# Patient Record
Sex: Male | Born: 1950 | Race: White | Hispanic: No | Marital: Married | Smoking: Never smoker
Health system: Southern US, Community
[De-identification: ages and names within clinical notes are randomized; demographics above are authoritative.]

## PROBLEM LIST (undated history)

## (undated) DIAGNOSIS — I2510 Atherosclerotic heart disease of native coronary artery without angina pectoris: Secondary | ICD-10-CM

## (undated) DIAGNOSIS — E119 Type 2 diabetes mellitus without complications: Secondary | ICD-10-CM

## (undated) DIAGNOSIS — I428 Other cardiomyopathies: Secondary | ICD-10-CM

## (undated) DIAGNOSIS — M75101 Unspecified rotator cuff tear or rupture of right shoulder, not specified as traumatic: Secondary | ICD-10-CM

## (undated) DIAGNOSIS — M1990 Unspecified osteoarthritis, unspecified site: Secondary | ICD-10-CM

## (undated) DIAGNOSIS — I509 Heart failure, unspecified: Secondary | ICD-10-CM

## (undated) DIAGNOSIS — K219 Gastro-esophageal reflux disease without esophagitis: Secondary | ICD-10-CM

## (undated) DIAGNOSIS — J45909 Unspecified asthma, uncomplicated: Secondary | ICD-10-CM

## (undated) DIAGNOSIS — I10 Essential (primary) hypertension: Secondary | ICD-10-CM

## (undated) DIAGNOSIS — E669 Obesity, unspecified: Secondary | ICD-10-CM

## (undated) DIAGNOSIS — E785 Hyperlipidemia, unspecified: Secondary | ICD-10-CM

## (undated) HISTORY — DX: Other cardiomyopathies: I42.8

## (undated) HISTORY — DX: Unspecified asthma, uncomplicated: J45.909

## (undated) HISTORY — DX: Atherosclerotic heart disease of native coronary artery without angina pectoris: I25.10

## (undated) HISTORY — PX: CHOLECYSTECTOMY: SHX55

---

## 2012-11-03 DIAGNOSIS — I428 Other cardiomyopathies: Secondary | ICD-10-CM

## 2012-11-03 HISTORY — DX: Other cardiomyopathies: I42.8

## 2012-11-26 ENCOUNTER — Inpatient Hospital Stay
Admission: RE | Admit: 2012-11-26 | Discharge: 2012-11-26 | Disposition: A | Discharge status: Home / Self Care | Payer: Self-pay | Source: Ambulatory Visit | Attending: Family Medicine | Admitting: Family Medicine

## 2012-11-26 ENCOUNTER — Ambulatory Visit
Admission: RE | Admit: 2012-11-26 | Discharge: 2012-11-26 | Disposition: A | Discharge status: Home / Self Care | Payer: BC Managed Care – PPO | Source: Ambulatory Visit | Attending: Family Medicine | Admitting: Family Medicine

## 2012-11-26 ENCOUNTER — Other Ambulatory Visit: Payer: Self-pay | Admitting: Family Medicine

## 2012-11-26 DIAGNOSIS — R0602 Shortness of breath: Secondary | ICD-10-CM

## 2012-11-26 DIAGNOSIS — R61 Generalized hyperhidrosis: Secondary | ICD-10-CM

## 2012-11-26 MED ORDER — IOHEXOL 350 MG/ML SOLN
100.0000 mL | Freq: Once | INTRAVENOUS | Status: AC | PRN
  Administered 2012-11-26: 100 mL via INTRAVENOUS

## 2012-11-30 ENCOUNTER — Encounter (HOSPITAL_COMMUNITY): Payer: Self-pay | Admitting: *Deleted

## 2012-11-30 ENCOUNTER — Other Ambulatory Visit (HOSPITAL_COMMUNITY): Payer: Self-pay | Admitting: Family Medicine

## 2012-11-30 ENCOUNTER — Emergency Department (HOSPITAL_COMMUNITY): Payer: BC Managed Care – PPO

## 2012-11-30 ENCOUNTER — Inpatient Hospital Stay (HOSPITAL_COMMUNITY)
Admission: EM | Admit: 2012-11-30 | Discharge: 2012-12-03 | DRG: 124 | Disposition: A | Discharge status: Home / Self Care | Payer: BC Managed Care – PPO | Attending: Cardiology | Admitting: Cardiology

## 2012-11-30 DIAGNOSIS — E119 Type 2 diabetes mellitus without complications: Secondary | ICD-10-CM | POA: Diagnosis present

## 2012-11-30 DIAGNOSIS — E785 Hyperlipidemia, unspecified: Secondary | ICD-10-CM | POA: Diagnosis present

## 2012-11-30 DIAGNOSIS — I059 Rheumatic mitral valve disease, unspecified: Secondary | ICD-10-CM | POA: Diagnosis present

## 2012-11-30 DIAGNOSIS — Z9089 Acquired absence of other organs: Secondary | ICD-10-CM

## 2012-11-30 DIAGNOSIS — Z6841 Body Mass Index (BMI) 40.0 and over, adult: Secondary | ICD-10-CM

## 2012-11-30 DIAGNOSIS — Z8249 Family history of ischemic heart disease and other diseases of the circulatory system: Secondary | ICD-10-CM

## 2012-11-30 DIAGNOSIS — Z7982 Long term (current) use of aspirin: Secondary | ICD-10-CM

## 2012-11-30 DIAGNOSIS — Z833 Family history of diabetes mellitus: Secondary | ICD-10-CM

## 2012-11-30 DIAGNOSIS — Y929 Unspecified place or not applicable: Secondary | ICD-10-CM

## 2012-11-30 DIAGNOSIS — R0609 Other forms of dyspnea: Secondary | ICD-10-CM | POA: Diagnosis present

## 2012-11-30 DIAGNOSIS — R9389 Abnormal findings on diagnostic imaging of other specified body structures: Secondary | ICD-10-CM | POA: Diagnosis present

## 2012-11-30 DIAGNOSIS — IMO0002 Reserved for concepts with insufficient information to code with codable children: Secondary | ICD-10-CM

## 2012-11-30 DIAGNOSIS — R0989 Other specified symptoms and signs involving the circulatory and respiratory systems: Secondary | ICD-10-CM | POA: Diagnosis present

## 2012-11-30 DIAGNOSIS — R0601 Orthopnea: Secondary | ICD-10-CM | POA: Diagnosis present

## 2012-11-30 DIAGNOSIS — R0602 Shortness of breath: Secondary | ICD-10-CM

## 2012-11-30 DIAGNOSIS — K219 Gastro-esophageal reflux disease without esophagitis: Secondary | ICD-10-CM | POA: Diagnosis present

## 2012-11-30 DIAGNOSIS — I498 Other specified cardiac arrhythmias: Secondary | ICD-10-CM | POA: Diagnosis present

## 2012-11-30 DIAGNOSIS — I420 Dilated cardiomyopathy: Secondary | ICD-10-CM

## 2012-11-30 DIAGNOSIS — I428 Other cardiomyopathies: Principal | ICD-10-CM | POA: Diagnosis present

## 2012-11-30 DIAGNOSIS — I10 Essential (primary) hypertension: Secondary | ICD-10-CM | POA: Diagnosis present

## 2012-11-30 DIAGNOSIS — E669 Obesity, unspecified: Secondary | ICD-10-CM | POA: Diagnosis present

## 2012-11-30 DIAGNOSIS — T383X5A Adverse effect of insulin and oral hypoglycemic [antidiabetic] drugs, initial encounter: Secondary | ICD-10-CM | POA: Diagnosis present

## 2012-11-30 DIAGNOSIS — I447 Left bundle-branch block, unspecified: Secondary | ICD-10-CM | POA: Diagnosis present

## 2012-11-30 HISTORY — DX: Obesity, unspecified: E66.9

## 2012-11-30 HISTORY — DX: Unspecified rotator cuff tear or rupture of right shoulder, not specified as traumatic: M75.101

## 2012-11-30 HISTORY — DX: Gastro-esophageal reflux disease without esophagitis: K21.9

## 2012-11-30 HISTORY — DX: Hyperlipidemia, unspecified: E78.5

## 2012-11-30 HISTORY — DX: Essential (primary) hypertension: I10

## 2012-11-30 HISTORY — DX: Type 2 diabetes mellitus without complications: E11.9

## 2012-11-30 LAB — COMPREHENSIVE METABOLIC PANEL
AST: 17 U/L (ref 0–37)
BUN: 19 mg/dL (ref 6–23)
CO2: 25 mEq/L (ref 19–32)
Calcium: 9.4 mg/dL (ref 8.4–10.5)
Chloride: 99 mEq/L (ref 96–112)
Creatinine, Ser: 0.88 mg/dL (ref 0.50–1.35)
GFR calc Af Amer: >90 mL/min (ref >90)
GFR calc non Af Amer: >90 mL/min (ref >90)
Glucose, Bld: 179 mg/dL — ABNORMAL HIGH (ref 70–99)
Total Bilirubin: 0.5 mg/dL (ref 0.3–1.2)

## 2012-11-30 LAB — PRO B NATRIURETIC PEPTIDE: Pro B Natriuretic peptide (BNP): 390.2 pg/mL — ABNORMAL HIGH (ref 0–125)

## 2012-11-30 LAB — CBC
HCT: 40.4 % (ref 39.0–52.0)
MCHC: 36.4 g/dL — ABNORMAL HIGH (ref 30.0–36.0)
RBC: 4.76 MIL/uL (ref 4.22–5.81)
WBC: 8.3 10*3/uL (ref 4.0–10.5)

## 2012-11-30 LAB — PROTIME-INR
INR: 0.87 (ref 0.00–1.49)
Prothrombin Time: 11.7 seconds (ref 11.6–15.2)

## 2012-11-30 LAB — POCT I-STAT 3, ART BLOOD GAS (G3+)
Acid-base deficit: 1 mmol/L (ref 0.0–2.0)
Bicarbonate: 22.4 mEq/L (ref 20.0–24.0)
pH, Arterial: 7.446 (ref 7.350–7.450)

## 2012-11-30 LAB — CREATININE, SERUM
GFR calc Af Amer: >90 mL/min (ref >90)
GFR calc non Af Amer: 88 mL/min — ABNORMAL LOW (ref >90)

## 2012-11-30 LAB — CBC WITH DIFFERENTIAL/PLATELET
Basophils Absolute: 0 10*3/uL (ref 0.0–0.1)
Eosinophils Absolute: 0.2 10*3/uL (ref 0.0–0.7)
Eosinophils Relative: 3 % (ref 0–5)
Lymphocytes Relative: 19 % (ref 12–46)
MCV: 84.3 fL (ref 78.0–100.0)
Neutrophils Relative %: 69 % (ref 43–77)
Platelets: 248 10*3/uL (ref 150–400)
RDW: 13.1 % (ref 11.5–15.5)
WBC: 8.8 10*3/uL (ref 4.0–10.5)

## 2012-11-30 LAB — TROPONIN I
Troponin I: <0.3 ng/mL (ref <0.30)
Troponin I: <0.3 ng/mL (ref <0.30)

## 2012-11-30 LAB — GLUCOSE, CAPILLARY: Glucose-Capillary: 133 mg/dL — ABNORMAL HIGH (ref 70–99)

## 2012-11-30 MED ORDER — ALBUTEROL SULFATE HFA 108 (90 BASE) MCG/ACT IN AERS
2.0000 | INHALATION_SPRAY | Freq: Four times a day (QID) | RESPIRATORY_TRACT | Status: DC | PRN
  Filled 2012-11-30: qty 6.7

## 2012-11-30 MED ORDER — INSULIN ASPART 100 UNIT/ML ~~LOC~~ SOLN
0.0000 [IU] | Freq: Three times a day (TID) | SUBCUTANEOUS | Status: DC
  Administered 2012-11-30: 5 [IU] via SUBCUTANEOUS
  Administered 2012-12-01: 3 [IU] via SUBCUTANEOUS
  Administered 2012-12-01 – 2012-12-03 (×4): 2 [IU] via SUBCUTANEOUS

## 2012-11-30 MED ORDER — NITROGLYCERIN 0.4 MG SL SUBL: 0.4000 mg | SUBLINGUAL_TABLET | SUBLINGUAL | Status: DC | PRN

## 2012-11-30 MED ORDER — ONDANSETRON HCL 4 MG/2ML IJ SOLN: 4.0000 mg | Freq: Four times a day (QID) | INTRAMUSCULAR | Status: DC | PRN

## 2012-11-30 MED ORDER — OXYCODONE-ACETAMINOPHEN 5-325 MG PO TABS: 1.0000 | ORAL_TABLET | ORAL | Status: DC | PRN

## 2012-11-30 MED ORDER — FUROSEMIDE 10 MG/ML IJ SOLN
40.0000 mg | Freq: Once | INTRAMUSCULAR | Status: AC
  Administered 2012-11-30: 40 mg via INTRAVENOUS
  Filled 2012-11-30: qty 4

## 2012-11-30 MED ORDER — ACETAMINOPHEN 325 MG PO TABS: 650.0000 mg | ORAL_TABLET | ORAL | Status: DC | PRN

## 2012-11-30 MED ORDER — PANTOPRAZOLE SODIUM 40 MG PO TBEC
40.0000 mg | DELAYED_RELEASE_TABLET | Freq: Every day | ORAL | Status: DC
  Administered 2012-11-30 – 2012-12-03 (×4): 40 mg via ORAL
  Filled 2012-11-30 (×4): qty 1

## 2012-11-30 MED ORDER — FUROSEMIDE 10 MG/ML IJ SOLN
40.0000 mg | Freq: Two times a day (BID) | INTRAMUSCULAR | Status: DC
  Administered 2012-11-30: 40 mg via INTRAVENOUS
  Filled 2012-11-30 (×3): qty 4

## 2012-11-30 MED ORDER — SODIUM CHLORIDE 0.9 % IJ SOLN: 3.0000 mL | INTRAMUSCULAR | Status: DC | PRN

## 2012-11-30 MED ORDER — SIMVASTATIN 20 MG PO TABS
20.0000 mg | ORAL_TABLET | Freq: Every day | ORAL | Status: DC
  Administered 2012-11-30 – 2012-12-01 (×2): 20 mg via ORAL
  Filled 2012-11-30 (×5): qty 1

## 2012-11-30 MED ORDER — LISINOPRIL 10 MG PO TABS
10.0000 mg | ORAL_TABLET | Freq: Every day | ORAL | Status: DC
  Administered 2012-11-30 – 2012-12-03 (×4): 10 mg via ORAL
  Filled 2012-11-30 (×4): qty 1

## 2012-11-30 MED ORDER — SODIUM CHLORIDE 0.9 % IJ SOLN
3.0000 mL | Freq: Two times a day (BID) | INTRAMUSCULAR | Status: DC
  Administered 2012-11-30 – 2012-12-03 (×6): 3 mL via INTRAVENOUS

## 2012-11-30 MED ORDER — GLIMEPIRIDE 2 MG PO TABS
2.0000 mg | ORAL_TABLET | Freq: Every day | ORAL | Status: DC
Start: 2012-12-01 — End: 2012-12-03
  Administered 2012-12-01 – 2012-12-03 (×2): 2 mg via ORAL
  Filled 2012-11-30 (×4): qty 1

## 2012-11-30 MED ORDER — ZOLPIDEM TARTRATE 5 MG PO TABS
5.0000 mg | ORAL_TABLET | Freq: Every evening | ORAL | Status: DC | PRN
  Administered 2012-12-01: 5 mg via ORAL
  Filled 2012-11-30: qty 1

## 2012-11-30 MED ORDER — ASPIRIN EC 81 MG PO TBEC
81.0000 mg | DELAYED_RELEASE_TABLET | Freq: Every day | ORAL | Status: DC
  Administered 2012-11-30 – 2012-12-01 (×2): 81 mg via ORAL
  Filled 2012-11-30 (×2): qty 1

## 2012-11-30 MED ORDER — HEPARIN SODIUM (PORCINE) 5000 UNIT/ML IJ SOLN
5000.0000 [IU] | Freq: Three times a day (TID) | INTRAMUSCULAR | Status: DC
  Administered 2012-11-30 – 2012-12-02 (×5): 5000 [IU] via SUBCUTANEOUS
  Filled 2012-11-30 (×8): qty 1

## 2012-11-30 MED ORDER — TRIAMCINOLONE ACETONIDE 0.1 % EX CREA
1.0000 "application " | TOPICAL_CREAM | Freq: Every day | CUTANEOUS | Status: DC
  Administered 2012-11-30 – 2012-12-02 (×3): 1 via TOPICAL
  Filled 2012-11-30: qty 15

## 2012-11-30 MED ORDER — SODIUM CHLORIDE 0.9 % IV SOLN: 250.0000 mL | INTRAVENOUS | Status: DC | PRN
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
## 2012-11-30 NOTE — ED Notes (Signed)
Patient did well, ambulated around unit at 02 stat 96%
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
## 2012-11-30 NOTE — ED Notes (Signed)
Patient states he has been having sob x 7 weeks, patient states worse at night, patient states he feels he can't catch his breath and cannot lie flat

## 2012-12-01 DIAGNOSIS — R0989 Other specified symptoms and signs involving the circulatory and respiratory systems: Secondary | ICD-10-CM

## 2012-12-01 DIAGNOSIS — I059 Rheumatic mitral valve disease, unspecified: Secondary | ICD-10-CM

## 2012-12-01 LAB — COMPREHENSIVE METABOLIC PANEL
BUN: 20 mg/dL (ref 6–23)
CO2: 26 mEq/L (ref 19–32)
Calcium: 8.8 mg/dL (ref 8.4–10.5)
Creatinine, Ser: 1.05 mg/dL (ref 0.50–1.35)
GFR calc Af Amer: 87 mL/min — ABNORMAL LOW (ref >90)
GFR calc non Af Amer: 75 mL/min — ABNORMAL LOW (ref >90)
Glucose, Bld: 202 mg/dL — ABNORMAL HIGH (ref 70–99)

## 2012-12-01 LAB — GLUCOSE, CAPILLARY: Glucose-Capillary: 135 mg/dL — ABNORMAL HIGH (ref 70–99)

## 2012-12-01 LAB — LIPID PANEL
HDL: 36 mg/dL — ABNORMAL LOW (ref >39)
LDL Cholesterol: 79 mg/dL (ref 0–99)
Triglycerides: 129 mg/dL (ref <150)
VLDL: 26 mg/dL (ref 0–40)

## 2012-12-01 LAB — TROPONIN I: Troponin I: <0.3 ng/mL (ref <0.30)

## 2012-12-01 LAB — PROTIME-INR: Prothrombin Time: 12.5 seconds (ref 11.6–15.2)

## 2012-12-01 MED ORDER — PERFLUTREN LIPID MICROSPHERE
1.0000 mL | INTRAVENOUS | Status: AC | PRN
  Administered 2012-12-01: 1.5 mL via INTRAVENOUS
  Filled 2012-12-01: qty 10

## 2012-12-01 MED ORDER — SODIUM CHLORIDE 0.9 % IV SOLN: 250.0000 mL | INTRAVENOUS | Status: DC | PRN

## 2012-12-01 MED ORDER — ASPIRIN 81 MG PO CHEW
324.0000 mg | CHEWABLE_TABLET | ORAL | Status: AC
  Administered 2012-12-02: 324 mg via ORAL
  Filled 2012-12-01: qty 4

## 2012-12-01 MED ORDER — METOPROLOL SUCCINATE ER 25 MG PO TB24
25.0000 mg | ORAL_TABLET | Freq: Every day | ORAL | Status: DC
  Administered 2012-12-01: 25 mg via ORAL
  Filled 2012-12-01 (×2): qty 1

## 2012-12-01 MED ORDER — ASPIRIN EC 81 MG PO TBEC
81.0000 mg | DELAYED_RELEASE_TABLET | Freq: Every day | ORAL | Status: DC
  Administered 2012-12-03: 81 mg via ORAL
  Filled 2012-12-01: qty 1

## 2012-12-01 MED ORDER — FUROSEMIDE 20 MG PO TABS
20.0000 mg | ORAL_TABLET | Freq: Every day | ORAL | Status: DC
  Administered 2012-12-01: 20 mg via ORAL
  Filled 2012-12-01 (×2): qty 1

## 2012-12-01 MED ORDER — SODIUM CHLORIDE 0.9 % IJ SOLN: 3.0000 mL | INTRAMUSCULAR | Status: DC | PRN

## 2012-12-01 MED ORDER — SODIUM CHLORIDE 0.9 % IJ SOLN
3.0000 mL | Freq: Two times a day (BID) | INTRAMUSCULAR | Status: DC
  Administered 2012-12-01 – 2012-12-02 (×2): 3 mL via INTRAVENOUS

## 2012-12-01 NOTE — Progress Notes (Addendum)
  Echocardiogram 2D Echocardiogram with Definity has been performed.  Cathie Beams 12/01/2012, 12:23 PM

## 2012-12-01 NOTE — Progress Notes (Signed)
   Subjective:  Denies CP; dyspnea improved   Objective:  Filed Vitals:   11/30/12 1100 11/30/12 1351 11/30/12 2201 12/01/12 0557  BP: 131/98 139/96 124/90 125/89  Pulse: 101 104 102 101  Temp:  98.1 F (36.7 C) 97.7 F (36.5 C) 97.5 F (36.4 C)  TempSrc:  Oral Oral Oral  Resp: 18 18 18 18   Height:      Weight:    262 lb 9.1 oz (119.1 kg)  SpO2: 95% 97% 100% 97%    Intake/Output from previous day:  Intake/Output Summary (Last 24 hours) at 12/01/12 0709 Last data filed at 12/01/12 1610  Gross per 24 hour  Intake    480 ml  Output   2100 ml  Net  -1620 ml    Physical Exam: Physical exam: Well-developed well-nourished in no acute distress.  Skin is warm and dry.  HEENT is normal.  Neck is supple.  Chest is clear to auscultation with normal expansion.  Cardiovascular exam is regular rate and rhythm. + gallop Abdominal exam nontender or distended. No masses palpated. Extremities show no edema. neuro grossly intact    Lab Results: Basic Metabolic Panel:  Recent Labs  96[birth year]04[birth year] 0830 11/30/12 1409 12/01/12 0137  NA 134*  --  135  K 3.9  --  3.7  CL 99  --  98  CO2 25  --  26  GLUCOSE 179*  --  202*  BUN 19  --  20  CREATININE 0.88 0.97 1.05  CALCIUM 9.4  --  8.8   CBC:  Recent Labs  11/30/12 1041 11/30/12 1409  WBC 8.8 8.3  NEUTROABS 6.1  --   HGB 14.1 14.7  HCT 38.7* 40.4  MCV 84.3 84.9  PLT 248 249   Cardiac Enzymes:  Recent Labs  11/30/12 1408 11/30/12 1954 12/01/12 0137  TROPONINI <0.30 <0.30 <0.30     Assessment/Plan:  1 dyspnea-predominant symptom is orthopnea. Much improved following diuresis. S4 noted on exam.  Plan echocardiogram today. If LV function is abnormal he will need cardiac catheterization. Otherwise we will plan an outpatient nuclear study for risk stratification. I will add low-dose Lasix 20 mg daily at home. He will need his potassium and renal function checked in one week. 2 abnormal chest CT-he will need followup  noncontrast chest CT in 6 months. 3 coronary calcification noted on CT-plan as outlined in #1. He has not had chest pain. Continue aspirin and statin. 4 hypertension-blood pressure has been mildly elevated. Noted to be mildly tachycardic on telemetry. Add low-dose beta-blockade. 5 diabetes mellitus-management per primary care. 6 hyperlipidemia-continue statin. [name] 12/01/2012, 7:09 AM

## 2012-12-01 NOTE — Care Management Note (Unsigned)
    Page 1 of 1   12/01/2012     3:20:29 PM   CARE MANAGEMENT NOTE 12/01/2012  Patient:  Gary Brady, Gary Brady   Account Number:  [id number]  Date Initiated:  12/01/2012  Documentation initiated by:  Milaina Sher  Subjective/Objective Assessment:   PT ADM ON 11/30/12 WITH DYSPNEA, SINUS TACH.  PTA, PT INDEPENDENT, LIVES WITH SPOUSE.     Action/Plan:   WILL FOLLOW FOR HOME NEEDS AS PT PROGRESSES.   Anticipated DC Date:  12/03/2012   Anticipated DC Plan:  HOME W HOME HEALTH SERVICES      DC Planning Services  CM consult      Choice offered to / List presented to:             Status of service:  In process, will continue to follow Medicare Important Message given?   (If response is "NO", the following Medicare IM given date fields will be blank) Date Medicare IM given:   Date Additional Medicare IM given:    Discharge Disposition:    Per UR Regulation:  Reviewed for med. necessity/level of care/duration of stay  If discussed at Long Length of Stay Meetings, dates discussed:    Comments:

## 2012-12-01 NOTE — Progress Notes (Signed)
Inpatient Diabetes Program Recommendations  AACE/ADA: New Consensus Statement on Inpatient Glycemic Control (2013)  Target Ranges:  Prepandial:   less than 140 mg/dL      Peak postprandial:   less than 180 mg/dL (1-2 hours)      Critically ill patients:  140 - 180 mg/dL   Reason for Visit: Note history of diabetes.  According to history Metformin causes diarrhea and likely will need alternative medication.  Please check A1C to determine pre-hospitalization glycemic control. Will need follow-up with PCP for diabetes after hospitalization.

## 2012-12-01 NOTE — Progress Notes (Signed)
Pt and wife watching video 115 regarding cardiac cath; will cont. To monitor.

## 2012-12-02 ENCOUNTER — Encounter (HOSPITAL_COMMUNITY)
Admission: EM | Disposition: A | Discharge status: Home / Self Care | Payer: Self-pay | Source: Home / Self Care | Attending: Cardiology

## 2012-12-02 DIAGNOSIS — I428 Other cardiomyopathies: Secondary | ICD-10-CM | POA: Diagnosis present

## 2012-12-02 DIAGNOSIS — E785 Hyperlipidemia, unspecified: Secondary | ICD-10-CM

## 2012-12-02 DIAGNOSIS — I2510 Atherosclerotic heart disease of native coronary artery without angina pectoris: Secondary | ICD-10-CM

## 2012-12-02 HISTORY — PX: LEFT AND RIGHT HEART CATHETERIZATION WITH CORONARY ANGIOGRAM: SHX5449

## 2012-12-02 LAB — CBC
HCT: 39.9 % (ref 39.0–52.0)
Hemoglobin: 14.7 g/dL (ref 13.0–17.0)
MCHC: 36.8 g/dL — ABNORMAL HIGH (ref 30.0–36.0)
Platelets: 271 10*3/uL (ref 150–400)
RBC: 4.69 MIL/uL (ref 4.22–5.81)
RBC: 4.78 MIL/uL (ref 4.22–5.81)
RDW: 13.1 % (ref 11.5–15.5)
WBC: 10.4 10*3/uL (ref 4.0–10.5)

## 2012-12-02 LAB — POCT I-STAT 3, ART BLOOD GAS (G3+)
Bicarbonate: 24.9 mEq/L — ABNORMAL HIGH (ref 20.0–24.0)
TCO2: 26 mmol/L (ref 0–100)
pO2, Arterial: 85 mmHg (ref 80.0–100.0)

## 2012-12-02 LAB — POCT I-STAT 3, VENOUS BLOOD GAS (G3P V)
Bicarbonate: 25.9 mEq/L — ABNORMAL HIGH (ref 20.0–24.0)
TCO2: 27 mmol/L (ref 0–100)
pCO2, Ven: 45.6 mmHg (ref 45.0–50.0)
pH, Ven: 7.362 — ABNORMAL HIGH (ref 7.250–7.300)

## 2012-12-02 LAB — CREATININE, SERUM
Creatinine, Ser: 1.1 mg/dL (ref 0.50–1.35)
GFR calc Af Amer: 82 mL/min — ABNORMAL LOW (ref >90)

## 2012-12-02 LAB — GLUCOSE, CAPILLARY
Glucose-Capillary: 160 mg/dL — ABNORMAL HIGH (ref 70–99)
Glucose-Capillary: 140 mg/dL — ABNORMAL HIGH (ref 70–99)

## 2012-12-02 SURGERY — LEFT AND RIGHT HEART CATHETERIZATION WITH CORONARY ANGIOGRAM
Anesthesia: LOCAL

## 2012-12-02 MED ORDER — HEPARIN SODIUM (PORCINE) 5000 UNIT/ML IJ SOLN
5000.0000 [IU] | Freq: Three times a day (TID) | INTRAMUSCULAR | Status: DC
  Administered 2012-12-02 – 2012-12-03 (×2): 5000 [IU] via SUBCUTANEOUS
  Filled 2012-12-02 (×5): qty 1

## 2012-12-02 MED ORDER — MIDAZOLAM HCL 2 MG/2ML IJ SOLN
INTRAMUSCULAR | Status: AC
  Filled 2012-12-02: qty 2

## 2012-12-02 MED ORDER — LIDOCAINE HCL (PF) 1 % IJ SOLN
INTRAMUSCULAR | Status: AC
  Filled 2012-12-02: qty 30

## 2012-12-02 MED ORDER — ONDANSETRON HCL 4 MG/2ML IJ SOLN: 4.0000 mg | Freq: Four times a day (QID) | INTRAMUSCULAR | Status: DC | PRN

## 2012-12-02 MED ORDER — SODIUM CHLORIDE 0.9 % IV SOLN: 250.0000 mL | INTRAVENOUS | Status: DC | PRN

## 2012-12-02 MED ORDER — POTASSIUM CHLORIDE CRYS ER 20 MEQ PO TBCR
40.0000 meq | EXTENDED_RELEASE_TABLET | Freq: Every day | ORAL | Status: DC
  Administered 2012-12-02: 40 meq via ORAL
  Filled 2012-12-02 (×3): qty 2

## 2012-12-02 MED ORDER — CARVEDILOL 6.25 MG PO TABS
6.2500 mg | ORAL_TABLET | Freq: Two times a day (BID) | ORAL | Status: DC
  Administered 2012-12-02 – 2012-12-03 (×3): 6.25 mg via ORAL
  Filled 2012-12-02 (×5): qty 1

## 2012-12-02 MED ORDER — SODIUM CHLORIDE 0.9 % IJ SOLN: 3.0000 mL | INTRAMUSCULAR | Status: DC | PRN

## 2012-12-02 MED ORDER — ATORVASTATIN CALCIUM 80 MG PO TABS
80.0000 mg | ORAL_TABLET | Freq: Every day | ORAL | Status: DC
  Administered 2012-12-02: 80 mg via ORAL
  Filled 2012-12-02 (×3): qty 1

## 2012-12-02 MED ORDER — ACETAMINOPHEN 325 MG PO TABS: 650.0000 mg | ORAL_TABLET | ORAL | Status: DC | PRN

## 2012-12-02 MED ORDER — FENTANYL CITRATE 0.05 MG/ML IJ SOLN
INTRAMUSCULAR | Status: AC
  Filled 2012-12-02: qty 2

## 2012-12-02 MED ORDER — SODIUM CHLORIDE 0.9 % IJ SOLN
3.0000 mL | Freq: Two times a day (BID) | INTRAMUSCULAR | Status: DC
  Administered 2012-12-02 (×2): 3 mL via INTRAVENOUS

## 2012-12-02 MED ORDER — FUROSEMIDE 10 MG/ML IJ SOLN
40.0000 mg | Freq: Two times a day (BID) | INTRAMUSCULAR | Status: DC
  Administered 2012-12-02: 40 mg via INTRAVENOUS
  Filled 2012-12-02 (×3): qty 4

## 2012-12-02 MED ORDER — NITROGLYCERIN 0.2 MG/ML ON CALL CATH LAB
INTRAVENOUS | Status: AC
  Filled 2012-12-02: qty 1

## 2012-12-02 MED ORDER — HEPARIN (PORCINE) IN NACL 2-0.9 UNIT/ML-% IJ SOLN
INTRAMUSCULAR | Status: AC
  Filled 2012-12-02: qty 1000

## 2012-12-02 NOTE — H&P (View-Only) (Signed)
   Subjective:  Denies CP; dyspnea improved   Objective:  Filed Vitals:   12/01/12 0557 12/01/12 1320 12/01/12 2010 12/02/12 0439  BP: 125/89 121/82 121/86 115/87  Pulse: 101 92 96 93  Temp: 97.5 F (36.4 C) 98 F (36.7 C) 97.7 F (36.5 C) 97.4 F (36.3 C)  TempSrc: Oral Oral Oral Oral  Resp: 18 18 20 18   Height:      Weight: 262 lb 9.1 oz (119.1 kg)   264 lb 8.8 oz (120 kg)  SpO2: 97% 98% 97% 97%    Intake/Output from previous day:  Intake/Output Summary (Last 24 hours) at 12/02/12 0657 Last data filed at 12/01/12 2317  Gross per 24 hour  Intake    840 ml  Output   2150 ml  Net  -1310 ml    Physical Exam: Physical exam: Well-developed well-nourished in no acute distress.  Skin is warm and dry.  HEENT is normal.  Neck is supple.  Chest is clear to auscultation with normal expansion.  Cardiovascular exam is regular rate and rhythm. + gallop Abdominal exam nontender or distended. No masses palpated. Extremities show no edema. neuro grossly intact    Lab Results: Basic Metabolic Panel:  Recent Labs  16/10/96 0830 11/30/12 1409 12/01/12 0137  NA 134*  --  135  K 3.9  --  3.7  CL 99  --  98  CO2 25  --  26  GLUCOSE 179*  --  202*  BUN 19  --  20  CREATININE 0.88 0.97 1.05  CALCIUM 9.4  --  8.8   CBC:  Recent Labs  11/30/12 1041 11/30/12 1409 12/02/12 0438  WBC 8.8 8.3 9.7  NEUTROABS 6.1  --   --   HGB 14.1 14.7 14.7  HCT 38.7* 40.4 39.9  MCV 84.3 84.9 85.1  PLT 248 249 297   Cardiac Enzymes:  Recent Labs  11/30/12 1408 11/30/12 1954 12/01/12 0137  TROPONINI <0.30 <0.30 <0.30     Assessment/Plan:  1 Cardiomyopathy - EF 15 on echo with moderate MR; etiology unclear. No recent viral illness. No history of alcohol abuse. TSH normal. Plan to proceed with right and left cardiac catheterization today to exclude coronary artery disease. The risks and benefits were discussed and the patient agrees to proceed. Hold Lasix in anticipation of  procedure. Continue lisinopril and change toprol to coreg. Increase as tolerated by pulse and blood pressure. He will then need a repeat echocardiogram in 3 months after medications fully titrated. If ejection fraction less than 35% he would need ICD. Resume Lasix following procedure. 2 abnormal chest CT-he will need followup noncontrast chest CT in 6 months. 3 coronary calcification noted on CT-plan as outlined in #1. He has not had chest pain. Continue aspirin and statin. 4 hypertension-Increase meds as tolerated 5 diabetes mellitus-management per primary care. 6 hyperlipidemia-change zocor to lipitor 80 mg daily  Gary Brady 12/02/2012, 6:57 AM

## 2012-12-02 NOTE — Plan of Care (Signed)
Problem: Consults Goal: Cardiac Cath Patient Education (See Patient Education module for education specifics.)  Outcome: Completed/Met Date Met:  12/02/12 Cardiac cath  video watched by patient and wife.

## 2012-12-02 NOTE — Progress Notes (Signed)
   Subjective:  Denies CP; dyspnea improved   Objective:  Filed Vitals:   12/01/12 0557 12/01/12 1320 12/01/12 2010 12/02/12 0439  BP: 125/89 121/82 121/86 115/87  Pulse: 101 92 96 93  Temp: 97.5 F (36.4 C) 98 F (36.7 C) 97.7 F (36.5 C) 97.4 F (36.3 C)  TempSrc: Oral Oral Oral Oral  Resp: 18 18 20 18  Height:      Weight: 262 lb 9.1 oz (119.1 kg)   264 lb 8.8 oz (120 kg)  SpO2: 97% 98% 97% 97%    Intake/Output from previous day:  Intake/Output Summary (Last 24 hours) at 12/02/12 0657 Last data filed at 12/01/12 2317  Gross per 24 hour  Intake    840 ml  Output   2150 ml  Net  -1310 ml    Physical Exam: Physical exam: Well-developed well-nourished in no acute distress.  Skin is warm and dry.  HEENT is normal.  Neck is supple.  Chest is clear to auscultation with normal expansion.  Cardiovascular exam is regular rate and rhythm. + gallop Abdominal exam nontender or distended. No masses palpated. Extremities show no edema. neuro grossly intact    Lab Results: Basic Metabolic Panel:  Recent Labs  11/30/12 0830 11/30/12 1409 12/01/12 0137  NA 134*  --  135  K 3.9  --  3.7  CL 99  --  98  CO2 25  --  26  GLUCOSE 179*  --  202*  BUN 19  --  20  CREATININE 0.88 0.97 1.05  CALCIUM 9.4  --  8.8   CBC:  Recent Labs  11/30/12 1041 11/30/12 1409 12/02/12 0438  WBC 8.8 8.3 9.7  NEUTROABS 6.1  --   --   HGB 14.1 14.7 14.7  HCT 38.7* 40.4 39.9  MCV 84.3 84.9 85.1  PLT 248 249 297   Cardiac Enzymes:  Recent Labs  11/30/12 1408 11/30/12 1954 12/01/12 0137  TROPONINI <0.30 <0.30 <0.30     Assessment/Plan:  1 Cardiomyopathy - EF 15 on echo with moderate MR; etiology unclear. No recent viral illness. No history of alcohol abuse. TSH normal. Plan to proceed with right and left cardiac catheterization today to exclude coronary artery disease. The risks and benefits were discussed and the patient agrees to proceed. Hold Lasix in anticipation of  procedure. Continue lisinopril and change toprol to coreg. Increase as tolerated by pulse and blood pressure. He will then need a repeat echocardiogram in 3 months after medications fully titrated. If ejection fraction less than 35% he would need ICD. Resume Lasix following procedure. 2 abnormal chest CT-he will need followup noncontrast chest CT in 6 months. 3 coronary calcification noted on CT-plan as outlined in #1. He has not had chest pain. Continue aspirin and statin. 4 hypertension-Increase meds as tolerated 5 diabetes mellitus-management per primary care. 6 hyperlipidemia-change zocor to lipitor 80 mg daily  Gary Brady 12/02/2012, 6:57 AM    

## 2012-12-02 NOTE — CV Procedure (Signed)
   Cardiac Catheterization Procedure Note  Name: Gary Brady MRN: [id number] DOB: 12/10/1950  Procedure: Right Heart Cath, Left Heart Cath, Selective Coronary Angiography, LV angiography  Indication: CHF, EF 15%.  Assess for CAD, assess CO and filling pressures.    Procedural Details: The right groin was prepped, draped, and anesthetized with 1% lidocaine. Using the modified Seldinger technique a 5 French sheath was placed in the right femoral artery and a 7 French sheath was placed in the right femoral vein. A Swan-Ganz catheter was used for the right heart catheterization. Standard protocol was followed for recording of right heart pressures and sampling of oxygen saturations. Fick cardiac output was calculated. MP catheter was used for selective coronary angiography and left ventriculography. There were no immediate procedural complications. The patient was transferred to the post catheterization recovery area for further monitoring.  Procedural Findings: Hemodynamics (mmHg) RA mean 8 RV 38/12 PA 39/23, mean 30 PCWP mean 28 LV 102/29 AO 103/75  Oxygen saturations: PA 61% AO 94%  Cardiac Output (Fick) 4.6  Cardiac Index (Fick) 2.02  Cardiac Output (Thermo) 4.32 Cardiac Index (Thermo) 1.9   Coronary angiography: Coronary dominance: right  Left mainstem: No significant disease.   Left anterior descending (LAD): Luminal irregularities in the LAD itself with 60% far distal LAD stenosis.  70-80% ostial stenosis of moderate D1, small D2 50% ostial stenosis, small D3 80% ostial stenosis, small D4 80% ostial stenosis.   Left circumflex (LCx): Luminal irregularities  Right coronary artery (RCA): Luminal irregularities in RCA.  Ostial PDA with 40% stenosis.   Left ventriculography: Hand LV-gram done with elevated LVEDP and recent echo.  EF appears to be around 25% with diffuse hypokinesis.   Final Conclusions:  Coronary angiography shows distal and branch vessel disease  consistent with diabetes.  No interventional target.  Cardiomyopathy appears to be nonischemic.  Left and right heart filling pressures remain elevated, CI is about 2.   Continue diuresis and medical treatment of cardiomyopathy.   Marca Ancona 12/02/2012, 11:52 AM

## 2012-12-02 NOTE — Progress Notes (Signed)
Bedrest is over. Frequent vital machine unhooked before Vitals placed in computer though VSS. SBP's noticed during vascular checks were in 110's-120's. Os sats above 93% and pt remained in SR. Will continue to monitor pt closely.

## 2012-12-02 NOTE — Interval H&P Note (Signed)
History and Physical Interval Note:  12/02/2012 11:12 AM  Gary Brady  has presented today for surgery, with the diagnosis of chf  The various methods of treatment have been discussed with the patient and family. After consideration of risks, benefits and other options for treatment, the patient has consented to  Procedure(s): LEFT AND RIGHT HEART CATHETERIZATION WITH CORONARY ANGIOGRAM (N/A) as a surgical intervention .  The patient's history has been reviewed, patient examined, no change in status, stable for surgery.  I have reviewed the patient's chart and labs.  Questions were answered to the patient's satisfaction.     Dalton [employer]

## 2012-12-03 DIAGNOSIS — E785 Hyperlipidemia, unspecified: Secondary | ICD-10-CM | POA: Diagnosis present

## 2012-12-03 DIAGNOSIS — R9389 Abnormal findings on diagnostic imaging of other specified body structures: Secondary | ICD-10-CM | POA: Diagnosis present

## 2012-12-03 DIAGNOSIS — I10 Essential (primary) hypertension: Secondary | ICD-10-CM | POA: Diagnosis present

## 2012-12-03 DIAGNOSIS — E119 Type 2 diabetes mellitus without complications: Secondary | ICD-10-CM | POA: Diagnosis present

## 2012-12-03 DIAGNOSIS — I428 Other cardiomyopathies: Principal | ICD-10-CM

## 2012-12-03 LAB — BASIC METABOLIC PANEL
BUN: 22 mg/dL (ref 6–23)
Chloride: 100 mEq/L (ref 96–112)
Creatinine, Ser: 1.07 mg/dL (ref 0.50–1.35)
GFR calc Af Amer: 85 mL/min — ABNORMAL LOW (ref >90)
GFR calc non Af Amer: 74 mL/min — ABNORMAL LOW (ref >90)
Glucose, Bld: 166 mg/dL — ABNORMAL HIGH (ref 70–99)

## 2012-12-03 LAB — GLUCOSE, CAPILLARY: Glucose-Capillary: 107 mg/dL — ABNORMAL HIGH (ref 70–99)

## 2012-12-03 MED ORDER — LISINOPRIL 10 MG PO TABS: 10.0000 mg | ORAL_TABLET | Freq: Every day | ORAL | Status: DC

## 2012-12-03 MED ORDER — FUROSEMIDE 40 MG PO TABS: 40.0000 mg | ORAL_TABLET | Freq: Every day | ORAL | Status: DC

## 2012-12-03 MED ORDER — POTASSIUM CHLORIDE CRYS ER 20 MEQ PO TBCR: 20.0000 meq | EXTENDED_RELEASE_TABLET | Freq: Every day | ORAL | Status: DC

## 2012-12-03 MED ORDER — POTASSIUM CHLORIDE CRYS ER 20 MEQ PO TBCR
20.0000 meq | EXTENDED_RELEASE_TABLET | Freq: Every day | ORAL | Status: DC
  Administered 2012-12-03: 20 meq via ORAL
  Filled 2012-12-03: qty 1

## 2012-12-03 MED ORDER — CARVEDILOL 6.25 MG PO TABS: 6.2500 mg | ORAL_TABLET | Freq: Two times a day (BID) | ORAL | Status: DC

## 2012-12-03 MED ORDER — ATORVASTATIN CALCIUM 80 MG PO TABS: 80.0000 mg | ORAL_TABLET | Freq: Every day | ORAL | Status: DC

## 2012-12-03 MED ORDER — METFORMIN HCL 1000 MG PO TABS: 1000.0000 mg | ORAL_TABLET | Freq: Two times a day (BID) | ORAL | Status: DC

## 2012-12-03 MED ORDER — FUROSEMIDE 40 MG PO TABS
40.0000 mg | ORAL_TABLET | Freq: Every day | ORAL | Status: DC
  Administered 2012-12-03: 40 mg via ORAL
  Filled 2012-12-03: qty 1

## 2012-12-03 NOTE — Progress Notes (Signed)
   Subjective:  Denies CP; dyspnea resolved   Objective:  Filed Vitals:   12/02/12 1015 12/02/12 1108 12/02/12 2041 12/03/12 0455  BP: 115/79  113/84 121/79  Pulse: 88 100 95 93  Temp:   97.8 F (36.6 C) 97.9 F (36.6 C)  TempSrc:   Oral Oral  Resp:   18 18  Height:      Weight:    263 lb 0.1 oz (119.3 kg)  SpO2:   98% 98%    Intake/Output from previous day:  Intake/Output Summary (Last 24 hours) at 12/03/12 0737 Last data filed at 12/03/12 0400  Gross per 24 hour  Intake    360 ml  Output   1700 ml  Net  -1340 ml    Physical Exam: Physical exam: Well-developed well-nourished in no acute distress.  Skin is warm and dry.  HEENT is normal.  Neck is supple.  Chest is clear to auscultation with normal expansion.  Cardiovascular exam is regular rate and rhythm. + gallop Abdominal exam nontender or distended. No masses palpated. Extremities show no edema. neuro grossly intact    Lab Results: Basic Metabolic Panel:  Recent Labs  81/19/14 0137 12/02/12 1421 12/03/12 0530  NA 135  --  135  K 3.7  --  4.1  CL 98  --  100  CO2 26  --  25  GLUCOSE 202*  --  166*  BUN 20  --  22  CREATININE 1.05 1.10 1.07  CALCIUM 8.8  --  9.1   CBC:  Recent Labs  11/30/12 1041  12/02/12 0438 12/02/12 1421  WBC 8.8  < > 9.7 10.4  NEUTROABS 6.1  --   --   --   HGB 14.1  < > 14.7 15.2  HCT 38.7*  < > 39.9 40.8  MCV 84.3  < > 85.1 85.4  PLT 248  < > 297 271  < > = values in this interval not displayed. Cardiac Enzymes:  Recent Labs  11/30/12 1408 11/30/12 1954 12/01/12 0137  TROPONINI <0.30 <0.30 <0.30     Assessment/Plan:  1 Cardiomyopathy - EF 15 on echo with moderate MR; etiology unclear. No recent viral illness. No history of alcohol abuse. TSH normal. Cath shows nonobstructive CAD. Continue lisinopril and coreg. Increase as tolerated by pulse and blood pressure as outpt. He will then need a repeat echocardiogram in 3 months after medications fully titrated.  If ejection fraction less than 35% he would need ICD. Continue lasix 40 mg daily and KCL 20 meq daily; check BMET 12/07/12. Will add sprionolactone as outpt. 2 abnormal chest CT-he will need followup noncontrast chest CT in 6 months. 3 coronary artery disease - Continue aspirin and statin. 4 hypertension-Increase meds as tolerated 5 diabetes mellitus-management per primary care. Hold glucophage for 48 hours following procedure. 6 hyperlipidemia-continue lipitor 80 mg daily; check lipids and liver in six weeks. DC today and fu with me in 4 weeks > 30 min PA and physician time D2 [name] 12/03/2012, 7:37 AM

## 2012-12-03 NOTE — Progress Notes (Signed)
Pt alert and oriented x 4.  No c/o pain or shortness of breath.  IVs d/c. Pt given education on medications, discharge instructions, post-catheterization instructions pt verbalized understanding

## 2012-12-03 NOTE — Discharge Summary (Signed)
CARDIOLOGY DISCHARGE SUMMARY   Patient ID: Gary Brady MRN: [id number] DOB/AGE: 1950-07-27 62 y.o.  Admit date: 11/30/2012 Discharge date: 12/03/2012  Primary Discharge Diagnosis:   Nonischemic cardiomyopathy Secondary Discharge Diagnosis:    Diabetes mellitus without complication   Hypertension   Hyperlipidemia   Abnormal chest CT   Procedures: Right Heart Cath, Left Heart Cath, Selective Coronary Angiography, LV angiography, 2-D echocardiogram  Hospital Course: Gary Brady is a 62 y.o. male with no history of CAD. He had a ongoing orthopnea and came to the hospital. A d-dimer was checked and was elevated so he had a CT angiogram of the chest. A CT angiogram of the chest as abnormalities, full results below. Atherosclerosis was noted and there was concern for underlying coronary artery disease so he was admitted for further evaluation and treatment.  His cardiac enzymes were negative for MI. His BNP was mildly elevated. A 2-D echocardiogram was performed with results below. His EF was significantly decreased. He had some mild volume overload and was diuresed with IV Lasix. His symptoms improved quickly and he is to be on oral Lasix at discharge. He is also on an ACE inhibitor and was started on a beta blocker.  Once his volume status improved, he was scheduled for right and left heart catheterization to further evaluate his cardiomyopathy and his possible coronary artery disease. Full cardiac catheterization results below. He had nonobstructive disease and is felt to have a nonischemic cardiomyopathy, cause unknown.  His metformin was held and his sugars were managed with sliding scale insulin. He is to restart the metformin 48 hours after the last procedure. A lipid profile was evaluated and he is to be on Lipitor 80 mg daily, recheck liver function testing and a lipid profile in 3 months.  On 12/03/2012, Gary Brady was evaluated by Dr. Jens Som. His final status was felt at  baseline he was intubating without chest pain or shortness of breath. Dr. Jens Som considered him stable for discharge, to follow up as an outpatient.  Of note, he had some nodules noted on his chest CT. Dr. Jens Som reviewed the CT and felt a noncontrast CT in 6 months should be performed to reevaluate them.  Labs:   Lab Results  Component Value Date   WBC 10.4 12/02/2012   HGB 15.2 12/02/2012   HCT 40.8 12/02/2012   MCV 85.4 12/02/2012   PLT 271 12/02/2012    Recent Labs Lab 12/01/12 0137  12/03/12 0530  NA 135  --  135  K 3.7  --  4.1  CL 98  --  100  CO2 26  --  25  BUN 20  --  22  CREATININE 1.05  < > 1.07  CALCIUM 8.8  --  9.1  PROT 6.5  --   --   BILITOT 0.5  --   --   ALKPHOS 68  --   --   ALT 19  --   --   AST 15  --   --   GLUCOSE 202*  --  166*  < > = values in this interval not displayed.  Recent Labs  11/30/12 1408 11/30/12 1954 12/01/12 0137  TROPONINI <0.30 <0.30 <0.30   Lipid Panel     Component Value Date/Time   CHOL 141 12/01/2012 0137   TRIG 129 12/01/2012 0137   HDL 36* 12/01/2012 0137   CHOLHDL 3.9 12/01/2012 0137   VLDL 26 12/01/2012 0137   LDLCALC 79 12/01/2012 0137  Pro B Natriuretic peptide (BNP)  Date/Time Value Range Status  11/30/2012  8:30 AM 390.2* 0 - 125 pg/mL Final    Recent Labs  12/01/12 1615  INR 0.95      Radiology: Dg Chest 2 View 11/30/2012   *RADIOLOGY REPORT*  Clinical Data: Shortness of breath.  CHEST - 2 VIEW  Comparison: No priors.  Findings: Lung volumes are low.  No acute consolidative airspace disease.  No pleural effusions.  Pulmonary vasculature and the cardiomediastinal silhouette are within normal limits.  No definite suspicious appearing pulmonary nodules or masses are identified by this plain film examination.  No pneumothorax.  IMPRESSION: 1.  Low lung volumes without radiographic evidence of acute cardiopulmonary disease.   Original Report Authenticated By: Trudie Reed, M.D.   Ct Angio Chest Pe W/cm &/or  Wo Cm 11/26/2012   *RADIOLOGY REPORT*  Clinical Data: Night sweats.  Shortness of breath.  Elevated D- dimer.  CT ANGIOGRAPHY CHEST  Technique:  Multidetector CT imaging of the chest using the standard protocol during bolus administration of intravenous contrast. Multiplanar reconstructed images including MIPs were obtained and reviewed to evaluate the vascular anatomy.  Contrast: OMNIPAQUE IOHEXOL 350 MG/ML SOLN  Comparison: No priors.  Findings:  Mediastinum: There are no filling defects within the pulmonary arterial tree to suggest underlying pulmonary embolism. Heart size is borderline enlarged. Small amount of anterior pericardial fluid and/or thickening, unlikely to be of hemodynamic significance at this time. There is atherosclerosis of the thoracic aorta, the great vessels of the mediastinum and the coronary arteries, including calcified atherosclerotic plaque in the left main, left anterior descending, left circumflex and right coronary arteries. There is a mildly enlarged right hilar lymph node measuring 1.3 cm in short axis.  No left hilar or mediastinal adenopathy is noted. Esophagus is unremarkable in appearance.  Lungs/Pleura: There is a slight mosaic attenuation throughout the lung parenchyma bilaterally, suggesting mild air trapping related to small airways disease.  No acute consolidative airspace disease. No pleural effusions.  In the left lower lobe there is a 7 mm pleural based nodule (image 69 of series 6) and a 3 mm nodule (image 68 of series 6), which are nonspecific.  No other larger more suspicious appearing pulmonary nodules or masses are otherwise identified.  No pneumothorax.  Upper Abdomen: Unremarkable.  Musculoskeletal: There are no aggressive appearing lytic or blastic lesions noted in the visualized portions of the skeleton.  IMPRESSION: 1.  No evidence of pulmonary embolism. 2.  Trace amount of pericardial fluid and/or thickening, unlikely to be of hemodynamic significance at  this time. 3. Atherosclerosis, including left main and three-vessel coronary artery disease. Please note that although the presence of coronary artery calcium documents the presence of coronary artery disease, the severity of this disease and any potential stenosis cannot be assessed on this non-gated CT examination.  Assessment for potential risk factor modification, dietary therapy or pharmacologic therapy may be warranted, if clinically indicated.  4.  Mildly enlarged right hilar lymph node measuring 1.3 cm in diameter.  This is nonspecific but warrants attention on follow-up studies. 5.  There are two small nodules in the left lower lobe, largest of which is subpleural in location measuring up to 7 mm in diameter (image 69 of series 6). If the patient is at high risk for bronchogenic carcinoma, follow-up chest CT at 3-6 months is recommended.  If the patient is at low risk for bronchogenic carcinoma, follow-up chest CT at 6-12 months is recommended.  This  recommendation follows the consensus statement: Guidelines for Management of Small Pulmonary Nodules Detected on CT Scans: A Statement from the Fleischner Society as published in Radiology 2005; 237:395-400. 6.  Evidence of mild air trapping in the lungs bilaterally, suggesting small airways disease.   Original Report Authenticated By: Trudie Reed, M.D.    Cardiac Cath: 12/02/2012 Procedural Findings:  Hemodynamics (mmHg)  RA mean 8  RV 38/12  PA 39/23, mean 30  PCWP mean 28  LV 102/29  AO 103/75  Oxygen saturations:  PA 61%  AO 94%  Cardiac Output (Fick) 4.6  Cardiac Index (Fick) 2.02  Cardiac Output (Thermo) 4.32  Cardiac Index (Thermo) 1.9  Coronary angiography:  Coronary dominance: right  Left mainstem: No significant disease.  Left anterior descending (LAD): Luminal irregularities in the LAD itself with 60% far distal LAD stenosis. 70-80% ostial stenosis of moderate D1, small D2 50% ostial stenosis, small D3 80% ostial stenosis,  small D4 80% ostial stenosis.  Left circumflex (LCx): Luminal irregularities  Right coronary artery (RCA): Luminal irregularities in RCA. Ostial PDA with 40% stenosis.  Left ventriculography: Hand LV-gram done with elevated LVEDP and recent echo. EF appears to be around 25% with diffuse hypokinesis.  Final Conclusions: Coronary angiography shows distal and branch vessel disease consistent with diabetes. No interventional target. Cardiomyopathy appears to be nonischemic. Left and right heart filling pressures remain elevated, CI is about 2. Continue diuresis and medical treatment of cardiomyopathy.    EKG:  3 02-Dec-2012 05:15:07   Normal sinus rhythm Left ventricular hypertrophy with QRS widening T wave abnormality, consider lateral ischemia Vent. rate 90 BPM PR interval 198 ms QRS duration 124 ms QT/QTc 414/506 ms P-R-T axes 71 -26 122  Echo: 12/01/2012 Conclusions - Left ventricle: The cavity size was mildly dilated. Wall thickness was increased in a pattern of mild LVH. The estimated ejection fraction was 15%. Diffuse hypokinesis. Doppler parameters are consistent with restrictive physiology, indicative of decreased left ventricular diastolic compliance and/or increased left atrial pressure. - Mitral valve: Moderate regurgitation. - Left atrium: The atrium was moderately dilated. - Right ventricle: Systolic function was mildly reduced. - Pulmonary arteries: Systolic pressure was moderately increased. PA peak pressure: 48mm Hg (S). - Pericardium, extracardiac: A trivial pericardial effusion was identified   FOLLOW UP PLANS AND APPOINTMENTS No Known Allergies   Medication List    STOP taking these medications       potassium gluconate 595 MG Tabs     pravastatin 40 MG tablet  Commonly known as:  PRAVACHOL      TAKE these medications       albuterol 108 (90 BASE) MCG/ACT inhaler  Commonly known as:  PROVENTIL HFA;VENTOLIN HFA  Inhale 2 puffs into the lungs every 6  (six) hours as needed for wheezing or shortness of breath.     aspirin EC 81 MG tablet  Take 81 mg by mouth daily.     atorvastatin 80 MG tablet  Commonly known as:  LIPITOR  Take 1 tablet (80 mg total) by mouth daily at 6 PM.     carvedilol 6.25 MG tablet  Commonly known as:  COREG  Take 1 tablet (6.25 mg total) by mouth 2 (two) times daily with a meal.     furosemide 40 MG tablet  Commonly known as:  LASIX  Take 1 tablet (40 mg total) by mouth daily.     glimepiride 4 MG tablet  Commonly known as:  AMARYL  Take 2 mg by mouth daily before  breakfast. Take 2 mg by mouth with breakfast daily.     lisinopril 10 MG tablet  Commonly known as:  PRINIVIL,ZESTRIL  Take 1 tablet (10 mg total) by mouth daily.     metFORMIN 1000 MG tablet  Commonly known as:  GLUCOPHAGE  Take 1 tablet (1,000 mg total) by mouth 2 (two) times daily with a meal. Take 1500 mg in the morning, and 1000 in the evening.  Start taking on:  12/05/2012     oxyCODONE-acetaminophen 5-325 MG per tablet  Commonly known as:  PERCOCET/ROXICET  Take 1 tablet by mouth every 4 (four) hours as needed for pain.     potassium chloride SA 20 MEQ tablet  Commonly known as:  K-DUR,KLOR-CON  Take 1 tablet (20 mEq total) by mouth daily.     PRILOSEC PO  Take 1 tablet by mouth daily.     triamcinolone cream 0.1 %  Commonly known as:  KENALOG  Apply 1 application topically daily as needed.        Discharge Orders   Future Appointments Provider Department Dept Phone   12/07/2012 11:00 AM [facility] [employer] [employer] Main Office [place]) [phone]   12/08/2012 1:00 PM [facility] RESPIRATORY THERAPY [phone]   01/06/2013 10:00 AM Rosalio Macadamia, NP [facility] Main Office [place]) [phone]   Future Orders Complete By Expires     (HEART FAILURE PATIENTS) Call MD:  Anytime you have any of the following symptoms: 1) 3 pound weight gain in 24 hours or 5 pounds in 1 week 2)  shortness of breath, with or without a dry hacking cough 3) swelling in the hands, feet or stomach 4) if you have to sleep on extra pillows at night in order to breathe.  As directed     Diet - low sodium heart healthy  As directed     Diet Carb Modified  As directed     Increase activity slowly  As directed       Follow-up Information   Follow up with [facility] On 12/07/2012. (Lab work at 11 AM, do not have to be fasting and can take morning medications)    Contact information:   [address] [place] [address]       Follow up with Norma Fredrickson, NP On 01/06/2013. (At 10 AM)    Contact information:   [address] [place] [address] [phone]       BRING ALL MEDICATIONS WITH YOU TO FOLLOW UP APPOINTMENTS  Time spent with patient to include physician time: 38 min Signed: Theodore Demark, PA-C 12/03/2012, 11:36 AM Co-Sign MD

## 2012-12-03 NOTE — Discharge Summary (Signed)
See progress notes Gary Brady  

## 2012-12-07 ENCOUNTER — Ambulatory Visit (INDEPENDENT_AMBULATORY_CARE_PROVIDER_SITE_OTHER): Payer: BC Managed Care – PPO | Admitting: *Deleted

## 2012-12-07 DIAGNOSIS — I10 Essential (primary) hypertension: Secondary | ICD-10-CM

## 2012-12-07 LAB — BASIC METABOLIC PANEL
BUN: 23 mg/dL (ref 6–23)
CO2: 27 mEq/L (ref 19–32)
Calcium: 9.8 mg/dL (ref 8.4–10.5)
Chloride: 101 mEq/L (ref 96–112)
Creatinine, Ser: 1.2 mg/dL (ref 0.4–1.5)
GFR: 66.26 mL/min (ref >60.00)
Glucose, Bld: 119 mg/dL — ABNORMAL HIGH (ref 70–99)
Potassium: 4.3 mEq/L (ref 3.5–5.1)
Sodium: 136 mEq/L (ref 135–145)

## 2012-12-08 ENCOUNTER — Ambulatory Visit (HOSPITAL_COMMUNITY)
Admission: RE | Admit: 2012-12-08 | Discharge: 2012-12-08 | Disposition: A | Discharge status: Home / Self Care | Payer: BC Managed Care – PPO | Source: Ambulatory Visit | Attending: Family Medicine | Admitting: Family Medicine

## 2012-12-08 ENCOUNTER — Encounter (HOSPITAL_COMMUNITY): Payer: BC Managed Care – PPO

## 2012-12-08 DIAGNOSIS — R0602 Shortness of breath: Secondary | ICD-10-CM | POA: Insufficient documentation

## 2012-12-08 MED ORDER — ALBUTEROL SULFATE (5 MG/ML) 0.5% IN NEBU
2.5000 mg | INHALATION_SOLUTION | Freq: Once | RESPIRATORY_TRACT | Status: AC
  Administered 2012-12-08: 2.5 mg via RESPIRATORY_TRACT

## 2012-12-10 ENCOUNTER — Telehealth: Payer: Self-pay | Admitting: Physician Assistant

## 2012-12-10 NOTE — Telephone Encounter (Addendum)
Pt called because he is coughing constantly, cannot sleep. Did not have the cough PTA. Recently d/c'd after new CHF admit, non-obs dz at cath. Was on lisinopril PTA but dose increased 5->10 mg. Cough is wheezy, non-productive, no fever, denies wt gain or edema. Hx asthma. Also has some chest pressure, nothing like admit symptoms. He is compliant with low-sodium diabetic diet. BP is well-controlled.  Advised him to track wt carefully, call if he is gaining. Advised him to see family MD today or tomorrow. OK to decrease lisinopril to 5 mg daily. OK to use albuterol inhaler but may need more meds if having asthma flare.

## 2013-01-01 ENCOUNTER — Encounter: Payer: Self-pay | Admitting: Emergency Medicine

## 2013-01-01 ENCOUNTER — Ambulatory Visit (INDEPENDENT_AMBULATORY_CARE_PROVIDER_SITE_OTHER): Payer: BC Managed Care – PPO | Admitting: Emergency Medicine

## 2013-01-01 VITALS — BP 120/80 | HR 94 | Ht 68.0 in | Wt 266.0 lb

## 2013-01-01 DIAGNOSIS — R9389 Abnormal findings on diagnostic imaging of other specified body structures: Secondary | ICD-10-CM

## 2013-01-01 DIAGNOSIS — J45909 Unspecified asthma, uncomplicated: Secondary | ICD-10-CM

## 2013-01-01 NOTE — Patient Instructions (Addendum)
We will repeat your CT scan of the chest in July 2015 Keep your albuterol available to use 2 puffs if needed for shortness of breath Follow with Dr Delton Coombes in July 2015 after your CT scan or sooner if you have any problems.

## 2013-01-01 NOTE — Assessment & Plan Note (Signed)
Childhood asthma, possible mixed disease on his spirometry - continue to have albuterol available prn.

## 2013-01-01 NOTE — Assessment & Plan Note (Signed)
Pulmonary nodules in non-smoker, low risk pt although he has ben exposed to embalming fluid through his job.  - repeat CT scan in July 2015 - review when scan cpmpleted
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
## 2013-01-06 ENCOUNTER — Ambulatory Visit (INDEPENDENT_AMBULATORY_CARE_PROVIDER_SITE_OTHER): Payer: BC Managed Care – PPO | Admitting: Nurse Practitioner

## 2013-01-06 ENCOUNTER — Encounter: Payer: Self-pay | Admitting: Nurse Practitioner

## 2013-01-06 VITALS — BP 110/70 | HR 88 | Ht 68.0 in | Wt 267.1 lb

## 2013-01-06 DIAGNOSIS — R0609 Other forms of dyspnea: Secondary | ICD-10-CM

## 2013-01-06 DIAGNOSIS — I428 Other cardiomyopathies: Secondary | ICD-10-CM

## 2013-01-06 DIAGNOSIS — R0600 Dyspnea, unspecified: Secondary | ICD-10-CM

## 2013-01-06 DIAGNOSIS — R0989 Other specified symptoms and signs involving the circulatory and respiratory systems: Secondary | ICD-10-CM

## 2013-01-06 LAB — BASIC METABOLIC PANEL
BUN: 21 mg/dL (ref 6–23)
CO2: 28 mEq/L (ref 19–32)
Calcium: 8.7 mg/dL (ref 8.4–10.5)
Chloride: 103 mEq/L (ref 96–112)
Creatinine, Ser: 1.1 mg/dL (ref 0.4–1.5)
GFR: 74.88 mL/min (ref >60.00)
Glucose, Bld: 204 mg/dL — ABNORMAL HIGH (ref 70–99)
Potassium: 3.9 mEq/L (ref 3.5–5.1)
Sodium: 136 mEq/L (ref 135–145)

## 2013-01-06 LAB — BRAIN NATRIURETIC PEPTIDE: Pro B Natriuretic peptide (BNP): 74 pg/mL (ref 0.0–100.0)

## 2013-01-06 MED ORDER — LOSARTAN POTASSIUM 100 MG PO TABS: 100.0000 mg | ORAL_TABLET | Freq: Every day | ORAL | Status: DC

## 2013-01-06 NOTE — Patient Instructions (Addendum)
You will need a repeat CT scan of your chest in January/February of 2015  Stop the Lisinopril  Start Losartan 100 mg a day (this is in the place of the Lisinopril)  Continue with your other medicines but take an extra half of Lasix when you get home today to get your weight back down.  Weigh yourself each morning and record.  Take extra dose of diuretic for weight gain of 3 pounds in 24 hours.   Limit sodium intake. Goal is to have less than 2000 mg (2gm) of salt per day.  We will check labs today.   Call the [facility] office at [phone] if you have any questions, problems or concerns.    Heart Failure Heart failure is a condition in which the heart has trouble pumping blood. This means your heart does not pump blood efficiently for your body to work well. In some cases of heart failure, fluid may back up into your lungs or you may have swelling (edema) in your lower legs. Heart failure is a long-term (chronic) condition. It is important for you to take good care of yourself and follow your caregiver's treatment plan. CAUSES   Health conditions:  High blood pressure (hypertension) causes the heart muscle to work harder than normal. When pressure in the blood vessels is high, the heart needs to pump (contract) with more force in order to circulate blood throughout the body. High blood pressure eventually causes the heart to become stiff and weak.  Coronary artery disease (CAD) is the buildup of cholesterol and fat (plaque) in the arteries of the heart. The blockage in the arteries deprives the heart muscle of oxygen and blood. This can cause chest pain and may lead to a heart attack. High blood pressure can also contribute to CAD.  Heart attack (myocardial infarction) occurs when 1 or more arteries in the heart become blocked. The loss of oxygen damages the muscle tissue of the heart. When this happens, part of the heart muscle dies. The injured tissue does  not contract as well and weakens the heart's ability to pump blood.  Abnormal heart valves can cause heart failure when the heart valves do not open and close properly. This makes the heart muscle pump harder to keep the blood flowing.  Heart muscle disease (cardiomyopathy or myocarditis) is damage to the heart muscle from a variety of causes. These can include drug or alcohol abuse, infections, or unknown reasons. These can increase the risk of heart failure.  Lung disease makes the heart work harder because the lungs do not work properly. This can cause a strain on the heart, leading it to fail.  Diabetes increases the risk of heart failure. High blood sugar contributes to high fat (lipid) levels in the blood. Diabetes can also cause slow damage to tiny blood vessels that carry important nutrients to the heart muscle. When the heart does not get enough oxygen and food, it can cause the heart to become weak and stiff. This leads to a heart that does not contract efficiently.  Other diseases can contribute to heart failure. These include abnormal heart rhythms, thyroid problems, and low blood counts (anemia).  Unhealthy behaviors:  Being overweight.  Smoking or chewing tobacco.  Eating foods high in fat and cholesterol.  Eating foods or drinking beverages high in salt.  Abusing drugs or alcohol.  Lacking physical activity. SYMPTOMS  Heart failure symptoms may vary and can be hard to detect. Symptoms may include:  Shortness of breath with activity, such as climbing stairs.  Persistent cough.  Swelling of the feet, ankles, legs, or abdomen.  Unexplained, sudden weight gain.  Difficulty breathing when lying flat (orthopnea).  Waking from sleep because of the need to sit up and get more air.  Rapid heartbeat.  Fatigue and loss of energy.  Feeling lightheaded, dizzy, or close to fainting.  Loss of appetite.  Nausea.  Increased urination during the night  (nocturia). DIAGNOSIS  A diagnosis of heart failure is based on your history, symptoms, physical examination, and diagnostic tests. Diagnostic tests for heart failure may include:  Electrocardiography.  Chest X-ray.  Blood tests.  Exercise stress test.  Echocardiography.  Cardiac angiography.  Radionuclide scans. TREATMENT  Treatment is aimed at managing the symptoms of heart failure. Medicines, behavioral changes, or surgical intervention may be necessary to treat heart failure.  Medicines to help treat heart failure may include:  Angiotensin-converting enzyme (ACE) inhibitors. This type of medicine blocks the effects of a blood protein called angiotensin-converting enzyme. ACE inhibitors relax (dilate) the blood vessels and help lower blood pressure.  Angiotensin receptor blockers. This type of medicine blocks the actions of a blood protein called angiotensin. Angiotensin receptor blockers dilate the blood vessels and help lower blood pressure.  Aldosterone antagonists. This type of medicine helps rid your body of extra fluid. This loss of extra fluid lowers the volume of blood the heart pumps.  Water pills (diuretics). Diuretics cause the kidneys to remove salt and water from the blood. The extra fluid is removed through urination.  Beta blockers. These prevent the heart from beating too fast and improve heart muscle strength.  Digitalis. This increases the force of the heartbeat.  Healthy behavior changes include:  Obtaining and maintaining a healthy weight.  Stopping smoking or chewing tobacco.  Eating heart healthy foods.  Limiting or avoiding alcohol.  Stopping drug use.  Becoming as physically active as possible.  Surgical treatment for heart failure may include:  A procedure to open blocked arteries, repair damaged heart valves, or remove damaged heart muscle tissue.  A pacemaker to improve heart muscle function and control certain abnormal heart  rhythms.  An internal cardioverter defibrillator to possibly prevent sudden cardiac death.  A left ventricular assist device to assist the pumping ability of the heart. HOME CARE INSTRUCTIONS   Take your medicine as directed by your caregiver. Medicines are important in reducing the workload of your heart, slowing the progression of heart failure, and improving your symptoms.  Do not stop taking your medicine unless directed by your caregiver.  Do not skip any dose of medicine.  Refill your prescriptions before you run out of medicine. Your medicines are needed every day.  Take over-the-counter medicine only as directed by your caregiver or pharmacist.  Stay physically active. Your caregiver or cardiac rehabilitation program can help determine your level of physical activity. It is important to follow your specific physical activity program. Regular physical activity can control weight and improve your energy, endurance, health, cholesterol levels, mood, and nighttime sleep. It is important to pace your physical activity to avoid shortness of breath or chest pain. It is recommended to rest for 1 hour before and after meals.  Eat heart healthy foods. Food choices should be low in saturated fat, trans fat, cholesterol, and salt (sodium). Healthy choices include fresh or frozen fruits and vegetables, fish, lean meats, legumes, fat-free or low-fat dairy products, and whole grain or high fiber foods. Limit your sodium to  1500 milligrams (mg) each day or as recommended by your caregiver. Talk to a dietitian to learn more about heart healthy foods and healthy seasonings.  Use healthy cooking methods. Healthy cooking methods include roasting, grilling, broiling, baking, poaching, steaming, or stir-frying. Talk to a dietitian to learn more about healthy cooking methods.  Limit fluids if directed by your caregiver. Fluid restriction may reduce symptoms of heart failure in some individuals.  Weigh  yourself every day. Daily weights are important in the early recognition of excess fluid. You should weigh yourself every morning after you urinate and before you eat breakfast. Wear the same amount of clothing each time you weigh yourself. Record your daily weight. Provide your caregiver with your weight record.  Monitor and record your blood pressure if directed by your caregiver.  Check your pulse if directed by your caregiver.  If you are overweight, it is time to lose and maintain a healthy weight.  Stop smoking or chewing tobacco. Nicotine makes your heart work harder by causing your blood vessels to constrict. Do not use nicotine gum or patches before talking to your caregiver.  Schedule and attend follow-up visits as directed by your caregiver. It is important to keep all your appointments.  Limit alcohol intake to no more than 1 drink per day for nonpregnant women and 2 drinks per day for men. Drinking more than that is harmful to your heart. Tell your caregiver if you drink alcohol several times a week. Talk with your caregiver about whether alcohol is safe for you. If your heart has already been damaged by alcohol or you have severe heart failure, drinking alcohol should be stopped completely.  Stop illegal drug use.  Stay up-to-date with immunizations. It is especially important to prevent respiratory infections through current pneumococcal and influenza immunizations.  Manage other health conditions such as hypertension, diabetes, thyroid disease, or abnormal heart rhythms as directed by your caregiver.  Learn to manage stress.  Plan rest periods when fatigued.  Learn strategies to manage high temperatures. If the weather is extremely hot:  Avoid vigorous physical activity.  Use air conditioning or fans or seek a cooler location.  Avoid caffeine and alcohol.  Wear loose-fitting, lightweight, and light-colored clothing.  Learn strategies to manage cold temperatures. If  the weather is extremely cold:  Avoid vigorous physical activity.  Layer clothes.  Wear mittens or gloves, a hat, and a scarf when going outside.  Avoid alcohol.  Obtain ongoing education and support as needed.  Participate or seek rehabilitation as needed to maintain or improve independence and quality of life. SEEK MEDICAL CARE IF:   Your weight increases by 3 lb/1.4 kg in 1 day or 5 lb/2.3 kg in a week.  You have increasing shortness of breath that is unusual for you.  You are unable to participate in your usual physical activities.  You tire easily.  You cough more than normal, especially with physical activity.  You have any or more swelling in areas such as your hands, feet, ankles, or abdomen.  You are unable to sleep because it is hard to breathe.  You cough up bloody mucus (sputum).  You feel like your heart is beating fast (palpitations).  You become dizzy or lightheaded upon standing up. SEEK IMMEDIATE MEDICAL CARE IF:   You have difficulty breathing.  There is a change in mental status such as decreased alertness or difficulty with concentration.  You have a pain or discomfort in your chest.  You have an episode  of fainting (syncope).  You are in an area of high temperatures and you have signs of heat exhaustion:  Heavy sweating.  Muscle cramps.  Weakness.  Dizziness.  Headaches.  Fainting.  You are in an area of cold temperatures and you have signs of hypothermia:  Clumsiness.  Confusion.  Sleepiness.  Shivering. MAKE SURE YOU:   Understand these instructions.  Will watch your condition.  Will get help right away if you are not doing well or get worse. Document Released: 04/22/2005 Document Revised: 04/08/2012 Document Reviewed: 11/21/2011 [facility] Patient Information 2014 [place], [place].
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
## 2013-01-22 ENCOUNTER — Encounter: Payer: Self-pay | Admitting: Nurse Practitioner

## 2013-01-22 ENCOUNTER — Ambulatory Visit (INDEPENDENT_AMBULATORY_CARE_PROVIDER_SITE_OTHER): Payer: BC Managed Care – PPO | Admitting: Nurse Practitioner

## 2013-01-22 VITALS — BP 110/70 | HR 76 | Ht 67.0 in | Wt 265.4 lb

## 2013-01-22 DIAGNOSIS — I428 Other cardiomyopathies: Secondary | ICD-10-CM

## 2013-01-22 LAB — BASIC METABOLIC PANEL
BUN: 21 mg/dL (ref 6–23)
CO2: 27 mEq/L (ref 19–32)
Calcium: 8.8 mg/dL (ref 8.4–10.5)
Chloride: 102 mEq/L (ref 96–112)
Creatinine, Ser: 1.1 mg/dL (ref 0.4–1.5)
GFR: 71.03 mL/min (ref >60.00)
Glucose, Bld: 136 mg/dL — ABNORMAL HIGH (ref 70–99)
Potassium: 3.8 mEq/L (ref 3.5–5.1)
Sodium: 137 mEq/L (ref 135–145)

## 2013-01-22 MED ORDER — CARVEDILOL 25 MG PO TABS: 12.5000 mg | ORAL_TABLET | Freq: Two times a day (BID) | ORAL | Status: DC
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
## 2013-01-22 NOTE — Patient Instructions (Addendum)
I think you are doing well  Stay on your current medicines but we are going to increase the Coreg to 12.5 mg two times a day - you can use 2 of your 6.25 mg tablets (taking two times a day and use them up) - there is a prescription for the 25 mg tablet (to take just 1/2 tablet two times a day).  Keep avoiding the salt  Keep weighing yourself each morning and record.  Take extra dose of diuretic for weight gain of 3 pounds in 24 hours.   We will recheck your kidney function today  Call the [facility] office at [phone] if you have any questions, problems or concerns.

## 2013-01-28 ENCOUNTER — Telehealth: Payer: Self-pay | Admitting: Nurse Practitioner

## 2013-01-28 NOTE — Telephone Encounter (Signed)
New Problem  SOB, retelling feeling in the chest/// having to prompt himself up to breath// Needs to speak with a nurse

## 2013-01-28 NOTE — Telephone Encounter (Signed)
Pt states for the last two days he has been having sore throat running nose, and during  the night his chest raddled, pt denies weight gain of edema on extremities. Pt was made aware that his voice sound like he has a head cold. Pt states has been taking chloriside HP for pts that have high BP. Pt was made aware that he can take tylenol for discomfort, and if his symptoms get worse, he needs to call his PCP. Pt verbalized understanding.

## 2013-02-17 ENCOUNTER — Ambulatory Visit (INDEPENDENT_AMBULATORY_CARE_PROVIDER_SITE_OTHER): Payer: BC Managed Care – PPO | Admitting: Nurse Practitioner

## 2013-02-17 ENCOUNTER — Encounter: Payer: Self-pay | Admitting: Nurse Practitioner

## 2013-02-17 VITALS — BP 120/80 | HR 72 | Ht 67.0 in | Wt 268.1 lb

## 2013-02-17 DIAGNOSIS — I428 Other cardiomyopathies: Secondary | ICD-10-CM

## 2013-02-17 LAB — BASIC METABOLIC PANEL
BUN: 20 mg/dL (ref 6–23)
CO2: 30 mEq/L (ref 19–32)
Calcium: 9.3 mg/dL (ref 8.4–10.5)
Chloride: 101 mEq/L (ref 96–112)
Creatinine, Ser: 1.1 mg/dL (ref 0.4–1.5)
GFR: 69.57 mL/min (ref >60.00)
Glucose, Bld: 141 mg/dL — ABNORMAL HIGH (ref 70–99)
Potassium: 4.4 mEq/L (ref 3.5–5.1)
Sodium: 139 mEq/L (ref 135–145)

## 2013-02-17 MED ORDER — FUROSEMIDE 40 MG PO TABS: ORAL_TABLET | ORAL | Status: DC

## 2013-02-17 MED ORDER — POTASSIUM CHLORIDE CRYS ER 20 MEQ PO TBCR: EXTENDED_RELEASE_TABLET | ORAL | Status: DC

## 2013-02-17 MED ORDER — SPIRONOLACTONE 25 MG PO TABS: 12.5000 mg | ORAL_TABLET | Freq: Every day | ORAL | Status: DC

## 2013-02-17 NOTE — Patient Instructions (Addendum)
Stay on your current medicines but I would like to add Aldactone 25 mg to take only 1/2 tablet daily.  Only take the lasix "as needed" this would be for weight gain of 2 to 3 pounds overnight, swelling, shortness of breath, etc. Take your potassium only on the days you take your Lasix  Continue to restrict your salt  We need to check lab today  We need to recheck your lab in one week  We need to repeat the ultrasound of your heart in early November  See Dr. Jens Som back for discussion - possible ICD referral  Call the [facility] office at [phone] if you have any questions, problems or concerns.
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
## 2013-02-24 ENCOUNTER — Other Ambulatory Visit: Payer: BC Managed Care – PPO

## 2013-02-26 ENCOUNTER — Other Ambulatory Visit (INDEPENDENT_AMBULATORY_CARE_PROVIDER_SITE_OTHER): Payer: BC Managed Care – PPO

## 2013-02-26 ENCOUNTER — Other Ambulatory Visit: Payer: BC Managed Care – PPO

## 2013-02-26 DIAGNOSIS — I428 Other cardiomyopathies: Secondary | ICD-10-CM

## 2013-02-26 LAB — BASIC METABOLIC PANEL
BUN: 13 mg/dL (ref 6–23)
CO2: 29 mEq/L (ref 19–32)
Calcium: 8.9 mg/dL (ref 8.4–10.5)
Chloride: 101 mEq/L (ref 96–112)
Creatinine, Ser: 1 mg/dL (ref 0.4–1.5)
GFR: 82.83 mL/min (ref >60.00)
Glucose, Bld: 180 mg/dL — ABNORMAL HIGH (ref 70–99)
Potassium: 4.2 mEq/L (ref 3.5–5.1)
Sodium: 137 mEq/L (ref 135–145)

## 2013-03-10 ENCOUNTER — Ambulatory Visit (HOSPITAL_COMMUNITY): Payer: BC Managed Care – PPO | Attending: Cardiology | Admitting: Radiology

## 2013-03-10 DIAGNOSIS — E119 Type 2 diabetes mellitus without complications: Secondary | ICD-10-CM | POA: Insufficient documentation

## 2013-03-10 DIAGNOSIS — R5381 Other malaise: Secondary | ICD-10-CM | POA: Insufficient documentation

## 2013-03-10 DIAGNOSIS — E669 Obesity, unspecified: Secondary | ICD-10-CM | POA: Insufficient documentation

## 2013-03-10 DIAGNOSIS — I059 Rheumatic mitral valve disease, unspecified: Secondary | ICD-10-CM | POA: Insufficient documentation

## 2013-03-10 DIAGNOSIS — E785 Hyperlipidemia, unspecified: Secondary | ICD-10-CM | POA: Insufficient documentation

## 2013-03-10 DIAGNOSIS — I428 Other cardiomyopathies: Secondary | ICD-10-CM | POA: Insufficient documentation

## 2013-03-10 DIAGNOSIS — I10 Essential (primary) hypertension: Secondary | ICD-10-CM | POA: Insufficient documentation

## 2013-03-10 NOTE — Progress Notes (Signed)
Echocardiogram performed.  

## 2013-03-18 ENCOUNTER — Ambulatory Visit (INDEPENDENT_AMBULATORY_CARE_PROVIDER_SITE_OTHER): Payer: BC Managed Care – PPO | Admitting: Cardiology

## 2013-03-18 ENCOUNTER — Encounter: Payer: Self-pay | Admitting: Cardiology

## 2013-03-18 VITALS — BP 138/92 | HR 81 | Ht 67.0 in | Wt 273.0 lb

## 2013-03-18 DIAGNOSIS — Z0000 Encounter for general adult medical examination without abnormal findings: Secondary | ICD-10-CM

## 2013-03-18 DIAGNOSIS — I428 Other cardiomyopathies: Secondary | ICD-10-CM

## 2013-03-18 DIAGNOSIS — I2510 Atherosclerotic heart disease of native coronary artery without angina pectoris: Secondary | ICD-10-CM | POA: Insufficient documentation

## 2013-03-18 MED ORDER — FUROSEMIDE 40 MG PO TABS: 20.0000 mg | ORAL_TABLET | Freq: Every day | ORAL | Status: DC

## 2013-03-18 MED ORDER — CARVEDILOL 12.5 MG PO TABS: ORAL_TABLET | ORAL | Status: DC

## 2013-03-18 NOTE — Patient Instructions (Signed)
Your physician wants you to follow-up in: 3 MONTHS WITH DR Jens Som You will receive a reminder letter in the mail two months in advance. If you don't receive a letter, please call our office to schedule the follow-up appointment.   START FUROSEMIDE 40 MG TAKE ONE HALF TABLET ONCE DAILY  Your physician recommends that you return for lab work in: ONE WEEK   INCREASE CARVEDILOL TO 18.75 MG TAKE ONE AND ONE HALF TABLETS TWICE DAILY(12.5 MG TABLETS)  REFERRAL TO ELECTROPHYSIOLOGIST TO DISCUSS ICD

## 2013-03-18 NOTE — Assessment & Plan Note (Signed)
Continue statin. 
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
## 2013-03-18 NOTE — Assessment & Plan Note (Signed)
Etiology unclear. He does have coronary artery disease but cardiomyopathy is out of proportion. No history of alcohol abuse. TSH normal. Question contribution from blood pressure. Continue present medications but increase carvedilol to 18.75 mg by mouth twice a day. I goal will be 25 twice a day. Add Lasix 20 mg daily. Check potassium and renal function. Long discussion about risk of sudden death and need for ICD. He is somewhat hesitant but would like to discuss with one of our electrophysiologists and I will arrange.

## 2013-03-18 NOTE — Assessment & Plan Note (Signed)
Continue aspirin and statin. 

## 2013-03-18 NOTE — Assessment & Plan Note (Signed)
Patient now followed by pulmonary.

## 2013-03-18 NOTE — Addendum Note (Signed)
Addended by: Jama Flavors on: 03/18/2013 11:14 AM   Modules accepted: Orders

## 2013-03-18 NOTE — Assessment & Plan Note (Signed)
Blood pressure mildly elevated. Increase carvedilol  And add Lasix 20 mg daily.

## 2013-03-19 ENCOUNTER — Telehealth: Payer: Self-pay | Admitting: Cardiology

## 2013-03-19 NOTE — Telephone Encounter (Signed)
Spoke with pt, yesterday we changed his furosemide from as needed to daily. He has a potassium pill he was to take when he took the lasix. Is he supposed to take it daily or wait until blood work is drawn? He is also taking spironolactone. Will forward for dr Jens Som review

## 2013-03-19 NOTE — Telephone Encounter (Signed)
New Problem:  Pt states he was seen yesterday and the doctor changed his meds. Pt would like to verify his medications with the nurse. Please advise

## 2013-03-19 NOTE — Telephone Encounter (Signed)
Left message for pt of dr crenshaw's recommendations  

## 2013-03-19 NOTE — Telephone Encounter (Signed)
Check bmet one week and then decide about kcl Gary Brady

## 2013-03-25 ENCOUNTER — Encounter: Payer: Self-pay | Admitting: *Deleted

## 2013-03-25 ENCOUNTER — Encounter: Payer: Self-pay | Admitting: Internal Medicine

## 2013-03-25 ENCOUNTER — Ambulatory Visit (INDEPENDENT_AMBULATORY_CARE_PROVIDER_SITE_OTHER): Payer: BC Managed Care – PPO | Admitting: Internal Medicine

## 2013-03-25 ENCOUNTER — Other Ambulatory Visit: Payer: BC Managed Care – PPO

## 2013-03-25 VITALS — BP 127/86 | HR 81 | Ht 67.0 in | Wt 279.0 lb

## 2013-03-25 DIAGNOSIS — I2510 Atherosclerotic heart disease of native coronary artery without angina pectoris: Secondary | ICD-10-CM

## 2013-03-25 DIAGNOSIS — I428 Other cardiomyopathies: Secondary | ICD-10-CM

## 2013-03-25 DIAGNOSIS — I10 Essential (primary) hypertension: Secondary | ICD-10-CM

## 2013-03-25 LAB — BASIC METABOLIC PANEL
BUN: 18 mg/dL (ref 6–23)
CO2: 30 mEq/L (ref 19–32)
Chloride: 101 mEq/L (ref 96–112)
GFR: 83.8 mL/min (ref >60.00)
Glucose, Bld: 123 mg/dL — ABNORMAL HIGH (ref 70–99)
Potassium: 4.1 mEq/L (ref 3.5–5.1)
Sodium: 137 mEq/L (ref 135–145)

## 2013-03-25 LAB — CBC WITH DIFFERENTIAL/PLATELET
Basophils Relative: 0.5 % (ref 0.0–3.0)
Eosinophils Relative: 5.9 % — ABNORMAL HIGH (ref 0.0–5.0)
Hemoglobin: 13.9 g/dL (ref 13.0–17.0)
MCV: 89.2 fl (ref 78.0–100.0)
Monocytes Absolute: 0.6 10*3/uL (ref 0.1–1.0)
Neutro Abs: 4.6 10*3/uL (ref 1.4–7.7)
Neutrophils Relative %: 62.1 % (ref 43.0–77.0)
RBC: 4.49 Mil/uL (ref 4.22–5.81)
WBC: 7.3 10*3/uL (ref 4.5–10.5)

## 2013-03-25 NOTE — Patient Instructions (Signed)
Your physician has recommended that you have a defibrillator inserted. An implantable cardioverter defibrillator (ICD) is a small device that is placed in your chest or, in rare cases, your abdomen. This device uses electrical pulses or shocks to help control life-threatening, irregular heartbeats that could lead the heart to suddenly stop beating (sudden cardiac arrest). Leads are attached to the ICD that goes into your heart. This is done in the hospital and usually requires an overnight stay. Please see the instruction sheet given to you today for more information.  See instruction sheet 
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
## 2013-03-31 ENCOUNTER — Encounter (HOSPITAL_COMMUNITY): Payer: Self-pay | Admitting: Pharmacy Technician

## 2013-04-05 MED ORDER — DEXTROSE 5 % IV SOLN
3.0000 g | INTRAVENOUS | Status: DC
  Filled 2013-04-05: qty 3000

## 2013-04-05 MED ORDER — SODIUM CHLORIDE 0.9 % IR SOLN
80.0000 mg | Status: DC
  Filled 2013-04-05: qty 2

## 2013-04-06 ENCOUNTER — Encounter (HOSPITAL_COMMUNITY): Payer: Self-pay | Admitting: General Practice

## 2013-04-06 ENCOUNTER — Encounter (HOSPITAL_COMMUNITY)
Admission: RE | Disposition: A | Discharge status: Home / Self Care | Payer: Self-pay | Source: Ambulatory Visit | Attending: Internal Medicine

## 2013-04-06 ENCOUNTER — Ambulatory Visit (HOSPITAL_COMMUNITY)
Admission: RE | Admit: 2013-04-06 | Discharge: 2013-04-07 | Disposition: A | Discharge status: Home / Self Care | Payer: BC Managed Care – PPO | Source: Ambulatory Visit | Attending: Internal Medicine | Admitting: Internal Medicine

## 2013-04-06 ENCOUNTER — Telehealth: Payer: Self-pay | Admitting: Cardiology

## 2013-04-06 DIAGNOSIS — I428 Other cardiomyopathies: Secondary | ICD-10-CM

## 2013-04-06 DIAGNOSIS — I10 Essential (primary) hypertension: Secondary | ICD-10-CM | POA: Insufficient documentation

## 2013-04-06 DIAGNOSIS — I2510 Atherosclerotic heart disease of native coronary artery without angina pectoris: Secondary | ICD-10-CM | POA: Insufficient documentation

## 2013-04-06 DIAGNOSIS — E119 Type 2 diabetes mellitus without complications: Secondary | ICD-10-CM

## 2013-04-06 DIAGNOSIS — E785 Hyperlipidemia, unspecified: Secondary | ICD-10-CM | POA: Insufficient documentation

## 2013-04-06 DIAGNOSIS — Z79899 Other long term (current) drug therapy: Secondary | ICD-10-CM | POA: Insufficient documentation

## 2013-04-06 DIAGNOSIS — J45909 Unspecified asthma, uncomplicated: Secondary | ICD-10-CM | POA: Insufficient documentation

## 2013-04-06 DIAGNOSIS — I519 Heart disease, unspecified: Secondary | ICD-10-CM

## 2013-04-06 DIAGNOSIS — K219 Gastro-esophageal reflux disease without esophagitis: Secondary | ICD-10-CM | POA: Insufficient documentation

## 2013-04-06 DIAGNOSIS — Z7982 Long term (current) use of aspirin: Secondary | ICD-10-CM | POA: Insufficient documentation

## 2013-04-06 DIAGNOSIS — I509 Heart failure, unspecified: Secondary | ICD-10-CM | POA: Insufficient documentation

## 2013-04-06 DIAGNOSIS — I5022 Chronic systolic (congestive) heart failure: Secondary | ICD-10-CM

## 2013-04-06 DIAGNOSIS — E669 Obesity, unspecified: Secondary | ICD-10-CM | POA: Insufficient documentation

## 2013-04-06 HISTORY — PX: IMPLANTABLE CARDIOVERTER DEFIBRILLATOR IMPLANT: SHX5473

## 2013-04-06 HISTORY — DX: Unspecified osteoarthritis, unspecified site: M19.90

## 2013-04-06 HISTORY — DX: Heart failure, unspecified: I50.9

## 2013-04-06 HISTORY — PX: IMPLANTABLE CARDIOVERTER DEFIBRILLATOR IMPLANT: SHX5860

## 2013-04-06 LAB — GLUCOSE, CAPILLARY: Glucose-Capillary: 125 mg/dL — ABNORMAL HIGH (ref 70–99)

## 2013-04-06 LAB — SURGICAL PCR SCREEN
MRSA, PCR: NEGATIVE
Staphylococcus aureus: NEGATIVE

## 2013-04-06 SURGERY — IMPLANTABLE CARDIOVERTER DEFIBRILLATOR IMPLANT
Anesthesia: LOCAL

## 2013-04-06 MED ORDER — PANTOPRAZOLE SODIUM 40 MG PO TBEC
40.0000 mg | DELAYED_RELEASE_TABLET | Freq: Every day | ORAL | Status: DC
  Administered 2013-04-07: 40 mg via ORAL
  Filled 2013-04-06 (×2): qty 1

## 2013-04-06 MED ORDER — CEFAZOLIN SODIUM 1-5 GM-% IV SOLN
1.0000 g | Freq: Four times a day (QID) | INTRAVENOUS | Status: DC
  Filled 2013-04-06 (×3): qty 50

## 2013-04-06 MED ORDER — SODIUM CHLORIDE 0.9 % IJ SOLN: 3.0000 mL | Freq: Two times a day (BID) | INTRAMUSCULAR | Status: DC

## 2013-04-06 MED ORDER — HYDROCODONE-ACETAMINOPHEN 5-325 MG PO TABS: 1.0000 | ORAL_TABLET | ORAL | Status: DC | PRN

## 2013-04-06 MED ORDER — SODIUM CHLORIDE 0.9 % IV SOLN: 250.0000 mL | INTRAVENOUS | Status: DC | PRN

## 2013-04-06 MED ORDER — CHLORHEXIDINE GLUCONATE 4 % EX LIQD
60.0000 mL | Freq: Once | CUTANEOUS | Status: DC
  Filled 2013-04-06: qty 60

## 2013-04-06 MED ORDER — LIDOCAINE HCL (PF) 1 % IJ SOLN
INTRAMUSCULAR | Status: AC
  Filled 2013-04-06: qty 60

## 2013-04-06 MED ORDER — MIDAZOLAM HCL 5 MG/5ML IJ SOLN
INTRAMUSCULAR | Status: AC
  Filled 2013-04-06: qty 5

## 2013-04-06 MED ORDER — PNEUMOCOCCAL VAC POLYVALENT 25 MCG/0.5ML IJ INJ
0.5000 mL | INJECTION | INTRAMUSCULAR | Status: AC
  Administered 2013-04-07: 0.5 mL via INTRAMUSCULAR
  Filled 2013-04-06: qty 0.5

## 2013-04-06 MED ORDER — MUPIROCIN 2 % EX OINT
TOPICAL_OINTMENT | CUTANEOUS | Status: AC
  Administered 2013-04-06: 1 via NASAL
  Filled 2013-04-06: qty 22

## 2013-04-06 MED ORDER — FENTANYL CITRATE 0.05 MG/ML IJ SOLN
INTRAMUSCULAR | Status: AC
  Filled 2013-04-06: qty 2

## 2013-04-06 MED ORDER — SODIUM CHLORIDE 0.9 % IJ SOLN: 3.0000 mL | INTRAMUSCULAR | Status: DC | PRN

## 2013-04-06 MED ORDER — CARVEDILOL 6.25 MG PO TABS
18.7500 mg | ORAL_TABLET | Freq: Two times a day (BID) | ORAL | Status: DC
  Administered 2013-04-06 – 2013-04-07 (×2): 18.75 mg via ORAL
  Filled 2013-04-06 (×4): qty 1

## 2013-04-06 MED ORDER — ONDANSETRON HCL 4 MG/2ML IJ SOLN: 4.0000 mg | Freq: Four times a day (QID) | INTRAMUSCULAR | Status: DC | PRN

## 2013-04-06 MED ORDER — SODIUM CHLORIDE 0.9 % IV SOLN
INTRAVENOUS | Status: DC
  Administered 2013-04-06: 12:00:00 via INTRAVENOUS

## 2013-04-06 MED ORDER — ACETAMINOPHEN 325 MG PO TABS
325.0000 mg | ORAL_TABLET | ORAL | Status: DC | PRN
  Administered 2013-04-06: 650 mg via ORAL
  Filled 2013-04-06: qty 2

## 2013-04-06 MED ORDER — GLIMEPIRIDE 4 MG PO TABS
8.0000 mg | ORAL_TABLET | Freq: Every day | ORAL | Status: DC
  Administered 2013-04-07: 8 mg via ORAL
  Filled 2013-04-06 (×2): qty 2

## 2013-04-06 MED ORDER — LOSARTAN POTASSIUM 50 MG PO TABS
100.0000 mg | ORAL_TABLET | Freq: Every day | ORAL | Status: DC
  Administered 2013-04-07: 100 mg via ORAL
  Filled 2013-04-06: qty 2

## 2013-04-06 MED ORDER — SPIRONOLACTONE 12.5 MG HALF TABLET
12.5000 mg | ORAL_TABLET | Freq: Every day | ORAL | Status: DC
  Administered 2013-04-06 – 2013-04-07 (×2): 12.5 mg via ORAL
  Filled 2013-04-06 (×2): qty 1

## 2013-04-06 MED ORDER — MUPIROCIN 2 % EX OINT
TOPICAL_OINTMENT | Freq: Two times a day (BID) | CUTANEOUS | Status: DC
  Administered 2013-04-06: 1 via NASAL
  Filled 2013-04-06: qty 22

## 2013-04-06 MED ORDER — CEFAZOLIN SODIUM 1-5 GM-% IV SOLN
1.0000 g | Freq: Four times a day (QID) | INTRAVENOUS | Status: AC
  Administered 2013-04-06 – 2013-04-07 (×2): 1 g via INTRAVENOUS
  Filled 2013-04-06 (×2): qty 50

## 2013-04-06 NOTE — Discharge Summary (Signed)
ELECTROPHYSIOLOGY PROCEDURE DISCHARGE SUMMARY    Patient ID: Gary Brady,  MRN: [id number], DOB/AGE: 60-01-1951 60 y.o.  Admit date: 04/06/2013 Discharge date: 04/07/2013  Primary Care Physician: Kaleen Mask, MD Primary Cardiologist: [name], MD Electrophysiologist: Hillis Range, MD  Primary Discharge Diagnosis:  Non ischemic cardiomyopathy status post ICD implantation this admission   Secondary Discharge Diagnosis:  1.  Diabetes 2.  Hypertension 3.  Hyperlipidemia 4.  GERD 5.  Obesity 6.  Asthma   Allergies  Allergen Reactions  . Ace Inhibitors     cough     Procedures This Admission:  1.  Implantation of a single chamber MDT chamber ICD on 04-06-2013 by Dr Johney Frame.  The patient received a Medtronic model number Evera XT VR ICD with model number [id number] right ventricular lead.  DFT's were successful at 15 J.  There were no immediate post procedure complications. 2.  CXR on 04-07-2013 demonstrated no pneumothorax status post device implantation.   Brief HPI: Gary Brady is a 62 y.o. male was referred to electrophysiology in the outpatient setting for consideration of ICD implantation.  Past medical history includes non ischemic cardiomyopathy, diabetes, hypertension.  The patient has persistent LV dysfunction despite guideline directed therapy.  Risks, benefits, and alternatives to ICD implantation were reviewed with the patient who wished to proceed.   Hospital Course:  The patient was admitted and underwent implantation of a MDT single chamber ICD with details as outlined above.   He was monitored on telemetry overnight which demonstrated sinus rhythm.  Left chest was without hematoma or ecchymosis.  The device was interrogated and found to be functioning normally.  CXR was obtained and demonstrated no pneumothorax status post device implantation.  Wound care, arm mobility, and restrictions were reviewed with the patient.  Dr Johney Frame examined the patient  and considered them stable for discharge to home.   The patient's discharge medications include an ARB (Losartan) and beta blocker (Carvedilol).   Physical Exam: Filed Vitals:   04/06/13 1955 04/06/13 2000 04/06/13 2105 04/07/13 0620  BP: 133/86 136/86 134/85 136/89  Pulse: 86 89 88 77  Temp:  98 F (36.7 C)  97.8 F (36.6 C)  TempSrc:    Oral  Resp:  18  18  Height:      Weight:      SpO2:  96%  97%    GEN- The patient is well appearing, alert and oriented x 3 today.   Head- normocephalic, atraumatic Eyes-  Sclera clear, conjunctiva pink Ears- hearing intact Oropharynx- clear Neck- supple, no JVP Lymph- no cervical lymphadenopathy Lungs- Clear to ausculation bilaterally, normal work of breathing Heart- Regular rate and rhythm, no murmurs, rubs or gallops, PMI not laterally displaced GI- soft, NT, ND, + BS Extremities- no clubbing, cyanosis, or edema MS- no significant deformity or atrophy Skin- ICD pocket is without hematoma Psych- euthymic mood, full affect Neuro- strength and sensation are intact  Labs:   Lab Results  Component Value Date   WBC 7.3 03/25/2013   HGB 13.9 03/25/2013   HCT 40.0 03/25/2013   MCV 89.2 03/25/2013   PLT 277.0 03/25/2013     Recent Labs Lab 04/07/13 0406  NA 138  K 3.9  CL 103  CO2 24  BUN 21  CREATININE 0.97  CALCIUM 8.4  GLUCOSE 121*     Discharge Medications:    Medication List    ASK your doctor about these medications       aspirin EC  81 MG tablet  Take 81 mg by mouth daily.     atorvastatin 80 MG tablet  Commonly known as:  LIPITOR  Take 80 mg by mouth daily at 6 PM.     carvedilol 12.5 MG tablet  Commonly known as:  COREG  Take 12.5 mg by mouth 2 (two) times daily with a meal.     carvedilol 12.5 MG tablet  Commonly known as:  COREG  Take 18.75 mg by mouth 2 (two) times daily with a meal. Take 1 and 1/2 tablets, two times daily.     furosemide 40 MG tablet  Commonly known as:  LASIX  Take 20 mg by  mouth daily. To take only as needed for weight gain of 2 to 3 pounds overnight, swelling, increased shortness of breath     glimepiride 4 MG tablet  Commonly known as:  AMARYL  Take 8 mg by mouth daily before breakfast.     losartan 100 MG tablet  Commonly known as:  COZAAR  Take 100 mg by mouth daily.     metFORMIN 1000 MG tablet  Commonly known as:  GLUCOPHAGE  Take 500 mg by mouth 2 (two) times daily with a meal.     potassium chloride SA 20 MEQ tablet  Commonly known as:  K-DUR,KLOR-CON  Take 20 mEq by mouth daily. Only take on days that Lasix is taken     PRILOSEC PO  Take 1 tablet by mouth daily.     spironolactone 25 MG tablet  Commonly known as:  ALDACTONE  Take 12.5 mg by mouth daily.     triamcinolone cream 0.1 %  Commonly known as:  KENALOG  Apply 1 application topically 2 (two) times daily.        Disposition:   Future Appointments Provider Department Dept Phone   04/15/2013 10:00 AM [facility] [place] Office [phone]   06/22/2013 8:30 AM Lewayne Bunting, MD [facility] [place] Office [phone]   07/07/2013 10:45 AM Hillis Range, MD [facility] Office [phone]       Duration of Discharge Encounter: Greater than 30 minutes including physician time.  Signed,  Hillis Range MD
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
## 2013-04-06 NOTE — Telephone Encounter (Signed)
Spoke with patient's wife who states they called this morning to inquire about taking Aldactone; patient has not taken it yet.  I advised that patient should hold Aldactone until after the procedure.  Patient's wife verbalized understanding and agreement.

## 2013-04-06 NOTE — Interval H&P Note (Signed)
History and Physical Interval Note:  04/06/2013 2:37 PM  Gary Brady  has presented today for surgery, with the diagnosis of cardiomyopathy  The various methods of treatment have been discussed with the patient and family. After consideration of risks, benefits and other options for treatment, the patient has consented to  Procedure(s): IMPLANTABLE CARDIOVERTER DEFIBRILLATOR IMPLANT (N/A) as a surgical intervention .  The patient's history has been reviewed, patient examined, no change in status, stable for surgery.  I have reviewed the patient's chart and labs.  Questions were answered to the patient's satisfaction.    ICD Criteria  Current LVEF (within 6 months):20%  NYHA Functional Classification: Class III  Heart Failure History:  Yes, Duration of heart failure since onset is 3 to 9 months  Non-Ischemic Dilated Cardiomyopathy History:  Yes, timeframe is 3 to 9 months  Atrial Fibrillation/Atrial Flutter:  No.  Ventricular Tachycardia History:  No.  Cardiac Arrest History:  No  History of Syndromes with Risk of Sudden Death:  No.  Previous ICD:  No.  Electrophysiology Study: No.  Anticoagulation Therapy:  Patient is NOT on anticoagulation therapy.   Beta Blocker Therapy:  Yes.   Ace Inhibitor/ARB Therapy:  Yes.  The patient is expected live > 1 year  Hillis Range

## 2013-04-06 NOTE — Op Note (Signed)
SURGEON:  Hillis Range, MD      PREPROCEDURE DIAGNOSES:   1. Nonischemic cardiomyopathy.   2. New York Heart Association class III, heart failure chronically.      POSTPROCEDURE DIAGNOSES:   1. nonischemic cardiomyopathy.   2. New York Heart Association class III heart failure chronically.      PROCEDURES:    1. ICD implantation.  2. Upper extremity venography  3. Defibrillation threshold testing     INTRODUCTION:  Gary Brady is a 62 y.o. male with a nonischemic CM (EF 20%) and NYHA Class III CHF.Marland Kitchen At this time, he meets SCD-HeFT criteria for ICD implantation for primary prevention of sudden death.  The patient has a narrow QRS and does not meet criteria for revascularization.  The patient has been treated with an optimal medical regimen but continues to have a depressed ejection fraction and NYHA Class III CHF symptoms.  The patient therefore  presents today for ICD implantation.      DESCRIPTION OF PROCEDURE:  Informed written consent was obtained and the patient was brought to the electrophysiology lab in the fasting state. The patient was adequately sedated with intravenous Versed and fentanyl as outlined in the nursing report.  The patient's left chest was prepped and draped in the usual sterile fashion by the EP lab staff.  The skin overlying the left deltopectoral region was infiltrated with lidocaine for local analgesia.  A 5-cm incision was made over the left deltopectoral region.  A left subcutaneous defibrillator pocket was fashioned using a combination of sharp and blunt dissection.  Electrocautery was used to assure hemostasis.   Left Upper extremity Venography:  A venogram of the left upper extremity was performed which revealed a large sized left axillary vein which emptied into a moderate sized left subclavian vein.    RA/RV Lead Placement: The left axillary vein was cannulated with fluoroscopic visualization.  Through the left axillary vein, a Medtronic model [id number]  (serial number [id number]) right ventricular defibrillator lead was advanced with fluoroscopic visualization into the right ventricular apex position.  Initial right ventricular lead R-wave measured 10.5 mV with impedance of 532 ohms and a threshold of 0.75 volts at 0.4 milliseconds.   The lead was secured to the pectoralis  fascia using #2 silk suture over the suture sleeves.  The pocket then  irrigated with copious gentamicin solution.  The leads were then  connected to a Medtronic Evera XT VR model [id number] (serial  Number [id number]) ICD.  The defibrillator was placed into the  pocket.  The pocket was then closed in 2 layers with 2.0 Vicryl suture  for the subcutaneous and subcuticular layers.  Steri-Strips and a  sterile dressing were then applied.   DFT Testing: Defibrillation Threshold testing was then performed. Ventricular fibrillation was induced with a T shock.  Adequate sensing of ventricular  fibrillation was observed with minimal dropout with a programmed sensitivity of 1.51mV.  The patient was successfully defibrillated to sinus rhythm with a single 15 joules shock delivered from the device with an impedance of 69 ohms in a duration of 3.3 seconds.  The patient remained in sinus rhythm thereafter.  There were no early apparent complications.     CONCLUSIONS:   1. nonischemic cardiomyopathy with chronic New York Heart Association class III heart failure.   2. Successful ICD implantation.   3. DFT less than or equal to 15 joules.   4. No early apparent complications.

## 2013-04-06 NOTE — Telephone Encounter (Signed)
New message    Pt is having defib implanted today at 10am---they have questions about his medications

## 2013-04-06 NOTE — Progress Notes (Signed)
Orthopedic Tech Progress Note Patient Details:  Gary Brady December 08, 1950 [id number]  Ortho Devices Type of Ortho Device: Arm sling Ortho Device/Splint Location: LUE Ortho Device/Splint Interventions: Ordered;Application   Jennye Moccasin 04/06/2013, 7:31 PM

## 2013-04-07 ENCOUNTER — Encounter (HOSPITAL_COMMUNITY): Payer: Self-pay | Admitting: *Deleted

## 2013-04-07 ENCOUNTER — Ambulatory Visit (HOSPITAL_COMMUNITY): Payer: BC Managed Care – PPO

## 2013-04-07 DIAGNOSIS — I428 Other cardiomyopathies: Secondary | ICD-10-CM

## 2013-04-07 LAB — BASIC METABOLIC PANEL
BUN: 21 mg/dL (ref 6–23)
CO2: 24 mEq/L (ref 19–32)
Chloride: 103 mEq/L (ref 96–112)
GFR calc Af Amer: >90 mL/min (ref >90)
GFR calc non Af Amer: 88 mL/min — ABNORMAL LOW (ref >90)
Glucose, Bld: 121 mg/dL — ABNORMAL HIGH (ref 70–99)
Potassium: 3.9 mEq/L (ref 3.5–5.1)

## 2013-04-07 NOTE — Progress Notes (Signed)
D/c orders received, IV removed with gauze on, pt remains in stable condition, pt meds and instructions reviewed and given to pt and pt family; reviewed arm movement with pt and family; pt d/c to home

## 2013-04-15 ENCOUNTER — Ambulatory Visit (INDEPENDENT_AMBULATORY_CARE_PROVIDER_SITE_OTHER): Payer: BC Managed Care – PPO | Admitting: *Deleted

## 2013-04-15 ENCOUNTER — Telehealth: Payer: Self-pay | Admitting: Cardiology

## 2013-04-15 DIAGNOSIS — I428 Other cardiomyopathies: Secondary | ICD-10-CM

## 2013-04-15 DIAGNOSIS — I519 Heart disease, unspecified: Secondary | ICD-10-CM

## 2013-04-15 DIAGNOSIS — I509 Heart failure, unspecified: Secondary | ICD-10-CM

## 2013-04-15 DIAGNOSIS — I5022 Chronic systolic (congestive) heart failure: Secondary | ICD-10-CM

## 2013-04-15 NOTE — Telephone Encounter (Signed)
Walk in pt Form " Disability Parking " paper dropped Off gave to Gary Brady

## 2013-04-16 NOTE — Progress Notes (Signed)
Wound check appointment. Steri-strips removed. Wound without redness or edema. Incision edges approximated, wound well healed. Normal device function. Threshold, sensing, and impedances consistent with implant measurements. Device programmed at 3.5V for extra safety margin until 3 month visit. Histogram distribution appropriate for patient and level of activity. No ventricular arrhythmias noted. Patient educated about wound care, arm mobility, lifting restrictions, shock plan. ROV in 3 months with JA. 

## 2013-04-19 LAB — MDC_IDC_ENUM_SESS_TYPE_INCLINIC
Battery Remaining Longevity: 136 mo
Battery Voltage: 3.09 V
Date Time Interrogation Session: 20141211160430
HighPow Impedance: 190 Ohm
Lead Channel Pacing Threshold Pulse Width: 0.4 ms
Lead Channel Sensing Intrinsic Amplitude: 8.375 mV
Lead Channel Setting Pacing Amplitude: 3.5 V
Lead Channel Setting Sensing Sensitivity: 0.3 mV
Zone Setting Detection Interval: 300 ms
Zone Setting Detection Interval: 330 ms

## 2013-04-19 NOTE — Telephone Encounter (Signed)
Spoke with pt, per dr Jens Som he would like the pt to walk as much as he can. Patient voiced understanding

## 2013-04-20 ENCOUNTER — Telehealth: Payer: Self-pay | Admitting: Cardiology

## 2013-04-20 NOTE — Telephone Encounter (Signed)
New message  Patient had a defib done 2 weeks ago, he wants to know if he can go bowling tonight? Please call and advise.

## 2013-04-20 NOTE — Telephone Encounter (Signed)
Attempted to leave message with patient's son. Son states that pt went to store and asked if pt allowed to go bowling. Advised son that activity should be restricted for 6 weeks and bowling is not advised at this time. Instructed for patient to call back with further questions.

## 2013-04-28 ENCOUNTER — Encounter: Payer: Self-pay | Admitting: Internal Medicine

## 2013-05-17 ENCOUNTER — Telehealth: Payer: Self-pay | Admitting: Cardiology

## 2013-05-17 NOTE — Telephone Encounter (Signed)
New message     Pt had defibulator put in 6wks ago---when can he resume normal duties?

## 2013-05-26 NOTE — Telephone Encounter (Signed)
Spoke w/pt---answered all questions/kwm

## 2013-06-22 ENCOUNTER — Ambulatory Visit: Payer: BC Managed Care – PPO | Admitting: Cardiology

## 2013-07-06 ENCOUNTER — Telehealth: Payer: Self-pay | Admitting: Internal Medicine

## 2013-07-06 NOTE — Telephone Encounter (Signed)
Pt does not meet American Heart associations requirements for prophylaxis ABX treatment. Told them he can have cleaning.

## 2013-07-06 NOTE — Telephone Encounter (Signed)
New message     Pt had defib placed in dec---at dentist office now for a cleaning---will this be ok?

## 2013-07-07 ENCOUNTER — Encounter: Payer: Self-pay | Admitting: Internal Medicine

## 2013-07-07 ENCOUNTER — Ambulatory Visit (INDEPENDENT_AMBULATORY_CARE_PROVIDER_SITE_OTHER): Payer: BC Managed Care – PPO | Admitting: Internal Medicine

## 2013-07-07 VITALS — BP 126/84 | HR 81 | Ht 67.0 in | Wt 285.5 lb

## 2013-07-07 DIAGNOSIS — I519 Heart disease, unspecified: Secondary | ICD-10-CM

## 2013-07-07 DIAGNOSIS — I5022 Chronic systolic (congestive) heart failure: Secondary | ICD-10-CM

## 2013-07-07 DIAGNOSIS — I509 Heart failure, unspecified: Secondary | ICD-10-CM

## 2013-07-07 DIAGNOSIS — I428 Other cardiomyopathies: Secondary | ICD-10-CM

## 2013-07-07 DIAGNOSIS — I2510 Atherosclerotic heart disease of native coronary artery without angina pectoris: Secondary | ICD-10-CM

## 2013-07-07 LAB — MDC_IDC_ENUM_SESS_TYPE_INCLINIC
Battery Remaining Longevity: 135 mo
Battery Voltage: 3.12 V
Brady Statistic RV Percent Paced: 0 %
HIGH POWER IMPEDANCE MEASURED VALUE: 190 Ohm
HighPow Impedance: 69 Ohm
Lead Channel Impedance Value: 513 Ohm
Lead Channel Pacing Threshold Amplitude: 0.625 V
Lead Channel Sensing Intrinsic Amplitude: 9.125 mV
Lead Channel Setting Pacing Amplitude: 2 V
Lead Channel Setting Pacing Pulse Width: 0.4 ms
Lead Channel Setting Sensing Sensitivity: 0.3 mV
MDC IDC MSMT LEADCHNL RV PACING THRESHOLD PULSEWIDTH: 0.4 ms
MDC IDC MSMT LEADCHNL RV SENSING INTR AMPL: 8.75 mV
MDC IDC SESS DTM: 20150304134436
MDC IDC SET ZONE DETECTION INTERVAL: 300 ms
Zone Setting Detection Interval: 330 ms
Zone Setting Detection Interval: 360 ms
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
## 2013-07-07 NOTE — Patient Instructions (Signed)
Remote monitoring is used to monitor your Pacemaker of ICD from home. This monitoring reduces the number of office visits required to check your device to one time per year. It allows [place] to keep an eye on the functioning of your device to ensure it is working properly. You are scheduled for a device check from home on 10/06/13 You may send your transmission at any time that day. If you have a wireless device, the transmission will be sent automatically. After your physician reviews your transmission, you will receive a postcard with your next transmission date.  Your physician wants you to follow-up in: 9 months with Dr Jacquiline Doe will receive a reminder letter in the mail two months in advance. If you don't receive a letter, please call our office to schedule the follow-up appointment.      Sherri Rad, RN will call you in regards to the [facility] clinic

## 2013-07-28 ENCOUNTER — Other Ambulatory Visit: Payer: Self-pay | Admitting: Family Medicine

## 2013-07-28 DIAGNOSIS — R109 Unspecified abdominal pain: Secondary | ICD-10-CM

## 2013-07-29 ENCOUNTER — Ambulatory Visit
Admission: RE | Admit: 2013-07-29 | Discharge: 2013-07-29 | Disposition: A | Discharge status: Home / Self Care | Payer: BC Managed Care – PPO | Source: Ambulatory Visit | Attending: Family Medicine | Admitting: Family Medicine

## 2013-07-29 DIAGNOSIS — R109 Unspecified abdominal pain: Secondary | ICD-10-CM

## 2013-08-03 ENCOUNTER — Encounter: Payer: Self-pay | Admitting: Cardiology

## 2013-08-03 ENCOUNTER — Ambulatory Visit (INDEPENDENT_AMBULATORY_CARE_PROVIDER_SITE_OTHER): Payer: BC Managed Care – PPO | Admitting: Cardiology

## 2013-08-03 VITALS — BP 140/86 | HR 70 | Ht 67.0 in | Wt 286.0 lb

## 2013-08-03 DIAGNOSIS — I2510 Atherosclerotic heart disease of native coronary artery without angina pectoris: Secondary | ICD-10-CM

## 2013-08-03 DIAGNOSIS — I428 Other cardiomyopathies: Secondary | ICD-10-CM

## 2013-08-03 DIAGNOSIS — E785 Hyperlipidemia, unspecified: Secondary | ICD-10-CM

## 2013-08-03 DIAGNOSIS — R9389 Abnormal findings on diagnostic imaging of other specified body structures: Secondary | ICD-10-CM

## 2013-08-03 DIAGNOSIS — I5022 Chronic systolic (congestive) heart failure: Secondary | ICD-10-CM

## 2013-08-03 DIAGNOSIS — I10 Essential (primary) hypertension: Secondary | ICD-10-CM

## 2013-08-03 DIAGNOSIS — I509 Heart failure, unspecified: Secondary | ICD-10-CM

## 2013-08-03 LAB — BASIC METABOLIC PANEL
BUN: 23 mg/dL (ref 6–23)
CO2: 26 mEq/L (ref 19–32)
Calcium: 9.2 mg/dL (ref 8.4–10.5)
Chloride: 101 mEq/L (ref 96–112)
Creatinine, Ser: 1.3 mg/dL (ref 0.4–1.5)
GFR: 59.17 mL/min — ABNORMAL LOW (ref >60.00)
Glucose, Bld: 221 mg/dL — ABNORMAL HIGH (ref 70–99)
Potassium: 3.9 mEq/L (ref 3.5–5.1)
SODIUM: 136 meq/L (ref 135–145)

## 2013-08-03 LAB — HEPATIC FUNCTION PANEL
ALBUMIN: 3.9 g/dL (ref 3.5–5.2)
ALT: 31 U/L (ref 0–53)
AST: 26 U/L (ref 0–37)
Alkaline Phosphatase: 72 U/L (ref 39–117)
Bilirubin, Direct: 0.1 mg/dL (ref 0.0–0.3)
Total Bilirubin: 1 mg/dL (ref 0.3–1.2)
Total Protein: 7 g/dL (ref 6.0–8.3)

## 2013-08-03 LAB — LIPID PANEL
CHOL/HDL RATIO: 3
Cholesterol: 98 mg/dL (ref 0–200)
HDL: 34.6 mg/dL — AB (ref >39.00)
LDL Cholesterol: 40 mg/dL (ref 0–99)
TRIGLYCERIDES: 119 mg/dL (ref 0.0–149.0)
VLDL: 23.8 mg/dL (ref 0.0–40.0)

## 2013-08-03 MED ORDER — CARVEDILOL 25 MG PO TABS: 25.0000 mg | ORAL_TABLET | Freq: Two times a day (BID) | ORAL | Status: DC

## 2013-08-03 NOTE — Assessment & Plan Note (Signed)
Continue aspirin and statin. 

## 2013-08-03 NOTE — Assessment & Plan Note (Signed)
Continue present medications.check potassium and renal function.

## 2013-08-03 NOTE — Patient Instructions (Signed)
Your physician wants you to follow-up in: 6 MONTHS WITH DR Jens Som You will receive a reminder letter in the mail two months in advance. If you don't receive a letter, please call our office to schedule the follow-up appointment.   INCREASE CARVEDILOL TO 25 MG = 2 12.5 MG TABLETS TWICE DAILY  Your physician recommends that you HAVE LAB WORK TODAY

## 2013-08-03 NOTE — Assessment & Plan Note (Signed)
Continue present dose of diuretics. 

## 2013-08-03 NOTE — Assessment & Plan Note (Addendum)
Continue ARB. Increase Coreg to 25 mg by mouth twice a day. ICD in place.

## 2013-08-03 NOTE — Assessment & Plan Note (Signed)
followup pulmonary.
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
## 2013-08-03 NOTE — Assessment & Plan Note (Signed)
Continue statin. Check lipids and liver. 

## 2013-08-09 ENCOUNTER — Encounter: Payer: BC Managed Care – PPO | Admitting: *Deleted

## 2013-08-09 ENCOUNTER — Other Ambulatory Visit: Payer: Self-pay | Admitting: *Deleted

## 2013-08-09 DIAGNOSIS — I2510 Atherosclerotic heart disease of native coronary artery without angina pectoris: Secondary | ICD-10-CM

## 2013-08-09 MED ORDER — CARVEDILOL 25 MG PO TABS: 25.0000 mg | ORAL_TABLET | Freq: Two times a day (BID) | ORAL | Status: DC

## 2013-10-06 ENCOUNTER — Telehealth: Payer: Self-pay | Admitting: Cardiology

## 2013-10-06 ENCOUNTER — Encounter: Payer: BC Managed Care – PPO | Admitting: *Deleted

## 2013-10-06 NOTE — Telephone Encounter (Signed)
Spoke with pt and reminded pt of remote transmission that is due today. Pt verbalized understanding.   

## 2013-10-08 ENCOUNTER — Other Ambulatory Visit: Payer: Self-pay | Admitting: Nurse Practitioner

## 2013-10-08 ENCOUNTER — Encounter: Payer: Self-pay | Admitting: Cardiology

## 2013-11-12 ENCOUNTER — Ambulatory Visit (INDEPENDENT_AMBULATORY_CARE_PROVIDER_SITE_OTHER): Payer: BC Managed Care – PPO | Admitting: *Deleted

## 2013-11-12 DIAGNOSIS — I509 Heart failure, unspecified: Secondary | ICD-10-CM

## 2013-11-12 DIAGNOSIS — I428 Other cardiomyopathies: Secondary | ICD-10-CM

## 2013-11-12 DIAGNOSIS — I5022 Chronic systolic (congestive) heart failure: Secondary | ICD-10-CM

## 2013-11-16 NOTE — Progress Notes (Signed)
Remote ICD transmission.   

## 2013-11-26 LAB — MDC_IDC_ENUM_SESS_TYPE_REMOTE
Battery Remaining Longevity: 133 mo
Battery Voltage: 3.05 V
Brady Statistic RV Percent Paced: 0.01 %
HIGH POWER IMPEDANCE MEASURED VALUE: 190 Ohm
HighPow Impedance: 76 Ohm
Lead Channel Impedance Value: 513 Ohm
Lead Channel Pacing Threshold Pulse Width: 0.4 ms
Lead Channel Sensing Intrinsic Amplitude: 8.625 mV
Lead Channel Setting Pacing Amplitude: 2 V
Lead Channel Setting Pacing Pulse Width: 0.4 ms
MDC IDC MSMT LEADCHNL RV PACING THRESHOLD AMPLITUDE: 0.5 V
MDC IDC MSMT LEADCHNL RV SENSING INTR AMPL: 8.625 mV
MDC IDC SESS DTM: 20150710170332
MDC IDC SET LEADCHNL RV SENSING SENSITIVITY: 0.3 mV
MDC IDC SET ZONE DETECTION INTERVAL: 300 ms
MDC IDC SET ZONE DETECTION INTERVAL: 360 ms
Zone Setting Detection Interval: 330 ms

## 2013-12-01 ENCOUNTER — Other Ambulatory Visit: Payer: Self-pay

## 2013-12-01 ENCOUNTER — Ambulatory Visit (INDEPENDENT_AMBULATORY_CARE_PROVIDER_SITE_OTHER)
Admission: RE | Admit: 2013-12-01 | Discharge: 2013-12-01 | Disposition: A | Discharge status: Home / Self Care | Payer: BC Managed Care – PPO | Source: Ambulatory Visit | Attending: Emergency Medicine | Admitting: Emergency Medicine

## 2013-12-01 DIAGNOSIS — R9389 Abnormal findings on diagnostic imaging of other specified body structures: Secondary | ICD-10-CM

## 2013-12-01 MED ORDER — ATORVASTATIN CALCIUM 80 MG PO TABS: 80.0000 mg | ORAL_TABLET | Freq: Every day | ORAL | Status: DC

## 2013-12-06 ENCOUNTER — Encounter: Payer: Self-pay | Admitting: *Deleted

## 2013-12-09 ENCOUNTER — Encounter: Payer: Self-pay | Admitting: Cardiology

## 2013-12-14 ENCOUNTER — Encounter: Payer: Self-pay | Admitting: Cardiology

## 2013-12-20 ENCOUNTER — Ambulatory Visit (INDEPENDENT_AMBULATORY_CARE_PROVIDER_SITE_OTHER): Payer: BC Managed Care – PPO | Admitting: *Deleted

## 2013-12-20 DIAGNOSIS — Z95810 Presence of automatic (implantable) cardiac defibrillator: Secondary | ICD-10-CM

## 2013-12-20 DIAGNOSIS — I5022 Chronic systolic (congestive) heart failure: Secondary | ICD-10-CM

## 2013-12-20 DIAGNOSIS — I509 Heart failure, unspecified: Secondary | ICD-10-CM

## 2013-12-23 ENCOUNTER — Encounter: Payer: Self-pay | Admitting: *Deleted

## 2013-12-23 NOTE — Progress Notes (Signed)
EPIC Encounter for ICM Monitoring  Patient Name: Gary Brady is a 63 y.o. male Date: 12/23/2013 Primary Care Physican: Kaleen Mask, MD Primary Cardiologist: Jens Som Electrophysiologist: Allred Dry Weight: 279 lbs       In the past month, have you:  1. Gained more than 2 pounds in a day or more than 5 pounds in a week? no  2. Had changes in your medications (with verification of current medications)? no  3. Had more shortness of breath than is usual for you? no  4. Limited your activity because of shortness of breath? no  5. Not been able to sleep because of shortness of breath? no  6. Had increased swelling in your feet or ankles? no  7. Had symptoms of dehydration (dizziness, dry mouth, increased thirst, decreased urine output) no  8. Had changes in sodium restriction? no  9. Been compliant with medication? Yes   ICM trend:   Follow-up plan: ICM clinic phone appointment: 01/24/14  Copy of note sent to patient's primary care physician, primary cardiologist, and device following physician.  Sherri Rad, RN, BSN 12/23/2013 10:41 AM

## 2013-12-28 ENCOUNTER — Encounter: Payer: Self-pay | Admitting: Internal Medicine

## 2014-01-07 ENCOUNTER — Encounter: Payer: BC Managed Care – PPO | Attending: Family Medicine | Admitting: *Deleted

## 2014-01-07 ENCOUNTER — Encounter: Payer: Self-pay | Admitting: *Deleted

## 2014-01-07 VITALS — Ht 67.0 in | Wt 278.6 lb

## 2014-01-07 DIAGNOSIS — Z713 Dietary counseling and surveillance: Secondary | ICD-10-CM | POA: Diagnosis not present

## 2014-01-07 DIAGNOSIS — E119 Type 2 diabetes mellitus without complications: Secondary | ICD-10-CM | POA: Diagnosis present

## 2014-01-07 NOTE — Progress Notes (Signed)
Medical Nutrition Therapy:  Appt start time: 0900 end time:  1000.  Assessment:  Patient here today for diabetes education. He has had diabetes for about 2 years. He had been maintain his blood glucose well with fasting levels <130. However, since December 2014, his BG has been increasing, now averaging 250-260. He was hospitalized then with CHF, which may have exacerbated his diabetes. This has also caused him to be less active and gain 15 pounds. He is currently working on adjusting his medications. 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 He also admits to eating a large dinner. He does not drink sweetened drinks, but will drink diet soda and tea sweetened with Sweet n Low. Despite lower than usual activity levels, he still is fairly active at work and at home (yardwork).   MEDICATIONS: Metformin 500 mg PM, Glimepiride 4 mg 1.5 tablets AM   DIETARY INTAKE:   Usual eating pattern includes 2-3 meals and 2 snacks per day.  24-hr recall:  B ( AM): Granola bar, banana OR honey bun  Snk ( AM): None  L ( PM): Sometimes skips, fast food Snk ( PM): Pretzels, animal crackers D ( PM): Meat, potatoes, vegetable Snk ( PM): Same Beverages: Water, diet soda, unsweetened tea  Usual physical activity: Yardwork, active job  Estimated energy needs: 1800 calories 203 g carbohydrates 113 g protein 60 g fat  Progress Towards Goal(s):  In progress.   Nutritional Diagnosis:  NB-1.1 Food and nutrition-related knowledge deficit As related to diabetes.  As evidenced by no prior education.    Intervention:  Nutrition counseling. We discussed basic carb counting, including foods with carbs, label reading, portion size, and meal planning.   Goals:  1. 2-3 carb servings per meal and 1 per snack until BG under control.  2. Read food label for total carbohydrates.  3. Pack healthy, quick foods for breakfast and  lunch (granola bars, fruit, sandwich). 4. Monitor portion size.  5. Increase activity by walking 10 minutes several times daily.   Handouts given during visit include:  Living well with diabetes  Meal plan card  Monitoring/Evaluation:  Dietary intake, exercise, BG, and body weight prn.

## 2014-01-18 ENCOUNTER — Other Ambulatory Visit: Payer: Self-pay | Admitting: Nurse Practitioner

## 2014-01-24 ENCOUNTER — Encounter: Payer: Self-pay | Admitting: *Deleted

## 2014-01-24 ENCOUNTER — Ambulatory Visit (INDEPENDENT_AMBULATORY_CARE_PROVIDER_SITE_OTHER): Payer: BC Managed Care – PPO | Admitting: *Deleted

## 2014-01-24 DIAGNOSIS — Z95810 Presence of automatic (implantable) cardiac defibrillator: Secondary | ICD-10-CM

## 2014-01-24 DIAGNOSIS — I509 Heart failure, unspecified: Secondary | ICD-10-CM

## 2014-01-24 DIAGNOSIS — I5022 Chronic systolic (congestive) heart failure: Secondary | ICD-10-CM

## 2014-01-24 NOTE — Progress Notes (Signed)
EPIC Encounter for ICM Monitoring  Patient Name: Gary Brady is a 63 y.o. male Date: 01/24/2014 Primary Care Physican: Kaleen Mask, MD Primary Cardiologist: Jens Som Electrophysiologist: Allred Dry Weight: 279 lbs       In the past month, have you:  1. Gained more than 2 pounds in a day or more than 5 pounds in a week? no  2. Had changes in your medications (with verification of current medications)? no  3. Had more shortness of breath than is usual for you? no  4. Limited your activity because of shortness of breath? no  5. Not been able to sleep because of shortness of breath? no  6. Had increased swelling in your feet or ankles? no  7. Had symptoms of dehydration (dizziness, dry mouth, increased thirst, decreased urine output) no  8. Had changes in sodium restriction? no  9. Been compliant with medication? Yes   ICM trend:   Follow-up plan: ICM clinic phone appointment: 02/24/14   Copy of note sent to patient's primary care physician, primary cardiologist, and device following physician.  Sherri Rad, RN, BSN 01/24/2014 4:12 PM

## 2014-01-31 ENCOUNTER — Ambulatory Visit (INDEPENDENT_AMBULATORY_CARE_PROVIDER_SITE_OTHER): Payer: BC Managed Care – PPO | Admitting: Cardiology

## 2014-01-31 ENCOUNTER — Encounter: Payer: Self-pay | Admitting: Cardiology

## 2014-01-31 VITALS — BP 142/88 | HR 80 | Ht 67.0 in | Wt 284.5 lb

## 2014-01-31 DIAGNOSIS — E785 Hyperlipidemia, unspecified: Secondary | ICD-10-CM

## 2014-01-31 DIAGNOSIS — I428 Other cardiomyopathies: Secondary | ICD-10-CM

## 2014-01-31 DIAGNOSIS — I509 Heart failure, unspecified: Secondary | ICD-10-CM

## 2014-01-31 DIAGNOSIS — I10 Essential (primary) hypertension: Secondary | ICD-10-CM

## 2014-01-31 DIAGNOSIS — Z95810 Presence of automatic (implantable) cardiac defibrillator: Secondary | ICD-10-CM

## 2014-01-31 DIAGNOSIS — I5022 Chronic systolic (congestive) heart failure: Secondary | ICD-10-CM

## 2014-01-31 DIAGNOSIS — I2510 Atherosclerotic heart disease of native coronary artery without angina pectoris: Secondary | ICD-10-CM

## 2014-01-31 NOTE — Assessment & Plan Note (Signed)
Continue statin. 

## 2014-01-31 NOTE — Assessment & Plan Note (Signed)
Followed by electrophysiology. 

## 2014-01-31 NOTE — Assessment & Plan Note (Signed)
Continue aspirin and statin. 
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
## 2014-01-31 NOTE — Patient Instructions (Signed)
Your physician wants you to follow-up in: 6 MONTHS WITH DR CRENSHAW You will receive a reminder letter in the mail two months in advance. If you don't receive a letter, please call our office to schedule the follow-up appointment.   Your physician recommends that you HAVE LAB WORK TODAY 

## 2014-01-31 NOTE — Assessment & Plan Note (Signed)
Continue present dose of diuretics. Euvolemic on examination. Check potassium and renal function.

## 2014-01-31 NOTE — Assessment & Plan Note (Signed)
Continue present medications. Check potassium and renal function. Follow blood pressure at home and increased regimen as needed.

## 2014-01-31 NOTE — Assessment & Plan Note (Signed)
Continue ACE inhibitor and beta blocker. 

## 2014-02-01 LAB — BASIC METABOLIC PANEL WITH GFR
BUN: 22 mg/dL (ref 6–23)
CHLORIDE: 100 meq/L (ref 96–112)
CO2: 27 mEq/L (ref 19–32)
Calcium: 8.9 mg/dL (ref 8.4–10.5)
Creat: 1.56 mg/dL — ABNORMAL HIGH (ref 0.50–1.35)
GFR, Est African American: 55 mL/min — ABNORMAL LOW
GFR, Est Non African American: 47 mL/min — ABNORMAL LOW
GLUCOSE: 157 mg/dL — AB (ref 70–99)
POTASSIUM: 4.4 meq/L (ref 3.5–5.3)
Sodium: 136 mEq/L (ref 135–145)

## 2014-02-04 ENCOUNTER — Other Ambulatory Visit: Payer: Self-pay | Admitting: *Deleted

## 2014-02-04 DIAGNOSIS — Z79899 Other long term (current) drug therapy: Secondary | ICD-10-CM

## 2014-02-14 ENCOUNTER — Telehealth: Payer: Self-pay | Admitting: Cardiology

## 2014-02-14 MED ORDER — SPIRONOLACTONE 25 MG PO TABS: 12.5000 mg | ORAL_TABLET | Freq: Two times a day (BID) | ORAL | Status: DC

## 2014-02-14 NOTE — Telephone Encounter (Signed)
Pt called stating that he has one more day left on his Spironolactone before he is out. He would like this called in the [facility] on [address] as soon as possible because he will be going out of town this week.  Thanks

## 2014-02-14 NOTE — Telephone Encounter (Signed)
Rx was sent to pharmacy electronically. 

## 2014-02-22 ENCOUNTER — Other Ambulatory Visit: Payer: Self-pay

## 2014-02-22 MED ORDER — LOSARTAN POTASSIUM 100 MG PO TABS: 100.0000 mg | ORAL_TABLET | Freq: Every day | ORAL | Status: DC

## 2014-02-24 ENCOUNTER — Ambulatory Visit (INDEPENDENT_AMBULATORY_CARE_PROVIDER_SITE_OTHER): Payer: BC Managed Care – PPO | Admitting: *Deleted

## 2014-02-24 ENCOUNTER — Encounter: Payer: Self-pay | Admitting: *Deleted

## 2014-02-24 ENCOUNTER — Encounter: Payer: Self-pay | Admitting: Internal Medicine

## 2014-02-24 DIAGNOSIS — I429 Cardiomyopathy, unspecified: Secondary | ICD-10-CM

## 2014-02-24 DIAGNOSIS — I428 Other cardiomyopathies: Secondary | ICD-10-CM

## 2014-02-24 DIAGNOSIS — Z95810 Presence of automatic (implantable) cardiac defibrillator: Secondary | ICD-10-CM

## 2014-02-24 NOTE — Progress Notes (Signed)
Remote ICD transmission.   

## 2014-02-24 NOTE — Addendum Note (Signed)
Addended by: Sherri Rad C on: 02/24/2014 03:17 PM   Modules accepted: Level of Service

## 2014-02-24 NOTE — Progress Notes (Signed)
EPIC Encounter for ICM Monitoring  Patient Name: Gary Brady is a 64 y.o. male Date: 02/24/2014 Primary Care Physican: Kaleen Mask, MD Primary Cardiologist: Jens Som Electrophysiologist: Allred Dry Weight: 279 lbs       In the past month, have you:  1. Gained more than 2 pounds in a day or more than 5 pounds in a week? no  2. Had changes in your medications (with verification of current medications)? no  3. Had more shortness of breath than is usual for you? no  4. Limited your activity because of shortness of breath? no  5. Not been able to sleep because of shortness of breath? no  6. Had increased swelling in your feet or ankles? no  7. Had symptoms of dehydration (dizziness, dry mouth, increased thirst, decreased urine output) no  8. Had changes in sodium restriction? no  9. Been compliant with medication? Yes   ICM trend:   Follow-up plan: ICM clinic phone appointment: 03/28/14  Copy of note sent to patient's primary care physician, primary cardiologist, and device following physician.  Sherri Rad, RN, BSN 02/24/2014 2:42 PM

## 2014-02-25 LAB — MDC_IDC_ENUM_SESS_TYPE_REMOTE
Battery Remaining Longevity: 131 mo
Date Time Interrogation Session: 20151022052506
HighPow Impedance: 78 Ohm
Lead Channel Impedance Value: 437 Ohm
Lead Channel Impedance Value: 513 Ohm
Lead Channel Pacing Threshold Amplitude: 0.625 V
Lead Channel Sensing Intrinsic Amplitude: 10.625 mV
Lead Channel Sensing Intrinsic Amplitude: 10.625 mV
Lead Channel Setting Pacing Amplitude: 2 V
MDC IDC MSMT BATTERY VOLTAGE: 3.03 V
MDC IDC MSMT LEADCHNL RV PACING THRESHOLD PULSEWIDTH: 0.4 ms
MDC IDC SET LEADCHNL RV PACING PULSEWIDTH: 0.4 ms
MDC IDC SET LEADCHNL RV SENSING SENSITIVITY: 0.3 mV
MDC IDC SET ZONE DETECTION INTERVAL: 360 ms
MDC IDC STAT BRADY RV PERCENT PACED: 0.01 %
Zone Setting Detection Interval: 300 ms
Zone Setting Detection Interval: 330 ms

## 2014-03-09 ENCOUNTER — Encounter: Payer: Self-pay | Admitting: Cardiology

## 2014-03-25 ENCOUNTER — Encounter: Payer: Self-pay | Admitting: Cardiology

## 2014-03-28 ENCOUNTER — Ambulatory Visit (INDEPENDENT_AMBULATORY_CARE_PROVIDER_SITE_OTHER): Payer: BC Managed Care – PPO | Admitting: *Deleted

## 2014-03-28 DIAGNOSIS — I5022 Chronic systolic (congestive) heart failure: Secondary | ICD-10-CM

## 2014-03-28 DIAGNOSIS — Z95810 Presence of automatic (implantable) cardiac defibrillator: Secondary | ICD-10-CM

## 2014-03-29 ENCOUNTER — Encounter: Payer: Self-pay | Admitting: *Deleted

## 2014-03-29 NOTE — Progress Notes (Signed)
EPIC Encounter for ICM Monitoring  Patient Name: Gary Brady is a 63 y.o. male Date: 03/29/2014 Primary Care Physican: Kaleen Mask, MD Primary Cardiologist: Jens Som Electrophysiologist: Allred Dry Weight: 279 lbs       In the past month, have you:  1. Gained more than 2 pounds in a day or more than 5 pounds in a week? no  2. Had changes in your medications (with verification of current medications)? Yes. The patient was on a high dose of metformin per his PCP and this was making him sick. He has been off of this since last Thursday and has been recording his blood sugars. He is to turn these in to Dr. Jeannetta Nap today.  3. Had more shortness of breath than is usual for you? no  4. Limited your activity because of shortness of breath? no  5. Not been able to sleep because of shortness of breath? no  6. Had increased swelling in your feet or ankles? no  7. Had symptoms of dehydration (dizziness, dry mouth, increased thirst, decreased urine output) no  8. Had changes in sodium restriction? no  9. Been compliant with medication? Yes   ICM trend:   Follow-up plan: ICM clinic phone appointment: 05/03/14. The patient is due for follow up with Dr. Johney Frame. I have scheduled him to be seen on 06/02/14 for device follow up.  Copy of note sent to patient's primary care physician, primary cardiologist, and device following physician.  Sherri Rad, RN, BSN 03/29/2014 9:02 AM

## 2014-03-29 NOTE — Addendum Note (Signed)
Addended by: Sherri Rad C on: 03/29/2014 09:06 AM   Modules accepted: Orders, Medications

## 2014-04-14 ENCOUNTER — Encounter (HOSPITAL_COMMUNITY): Payer: Self-pay | Admitting: Cardiology

## 2014-05-03 ENCOUNTER — Ambulatory Visit (INDEPENDENT_AMBULATORY_CARE_PROVIDER_SITE_OTHER): Payer: BC Managed Care – PPO | Admitting: *Deleted

## 2014-05-03 DIAGNOSIS — Z95810 Presence of automatic (implantable) cardiac defibrillator: Secondary | ICD-10-CM

## 2014-05-03 DIAGNOSIS — I5022 Chronic systolic (congestive) heart failure: Secondary | ICD-10-CM

## 2014-05-04 ENCOUNTER — Encounter: Payer: Self-pay | Admitting: *Deleted

## 2014-05-04 NOTE — Progress Notes (Signed)
EPIC Encounter for ICM Monitoring  Patient Name: Gary Brady is a 63 y.o. male Date: 05/04/2014 Primary Care Physican: Kaleen Mask, MD Primary Cardiologist: Jens Som Electrophysiologist: Allred Dry Weight: 279 lbs       In the past month, have you:  1. Gained more than 2 pounds in a day or more than 5 pounds in a week? no  2. Had changes in your medications (with verification of current medications)? no  3. Had more shortness of breath than is usual for you? no  4. Limited your activity because of shortness of breath? no  5. Not been able to sleep because of shortness of breath? no  6. Had increased swelling in your feet or ankles? No. He has noticed some swelling to his hands.  7. Had symptoms of dehydration (dizziness, dry mouth, increased thirst, decreased urine output) no  8. Had changes in sodium restriction? no  9. Been compliant with medication? Yes   ICM trend:   Follow-up plan: ICM clinic phone appointment: 07/04/14. The patient is due to follow up with Dr. Johney Frame on 06/02/14. His optivol readings were up some around the Thanksgiving holiday, then back to baseline for a short time. They started to rise again ~ 04/14/14- present. The only symptoms he reports is to his hands. He is currently taking spironolactone 12.5 mg BID. He has PRN lasix to take. He is currently away from home and is uncertain of the dose that he has. His chart states 40 mg tablets. I have advised him to take a whole tablet of lasix x 3 days, then resume PRN dosing. He is agreeable.   Copy of note sent to patient's primary care physician, primary cardiologist, and device following physician.  Sherri Rad, RN, BSN 05/04/2014 2:24 PM

## 2014-06-02 ENCOUNTER — Ambulatory Visit (INDEPENDENT_AMBULATORY_CARE_PROVIDER_SITE_OTHER): Payer: BC Managed Care – PPO | Admitting: Internal Medicine

## 2014-06-02 ENCOUNTER — Encounter: Payer: Self-pay | Admitting: Internal Medicine

## 2014-06-02 VITALS — BP 148/84 | HR 68 | Ht 67.0 in | Wt 290.8 lb

## 2014-06-02 DIAGNOSIS — I429 Cardiomyopathy, unspecified: Secondary | ICD-10-CM

## 2014-06-02 DIAGNOSIS — I110 Hypertensive heart disease with heart failure: Secondary | ICD-10-CM | POA: Diagnosis not present

## 2014-06-02 DIAGNOSIS — I5022 Chronic systolic (congestive) heart failure: Secondary | ICD-10-CM | POA: Diagnosis not present

## 2014-06-02 DIAGNOSIS — I509 Heart failure, unspecified: Secondary | ICD-10-CM

## 2014-06-02 DIAGNOSIS — I428 Other cardiomyopathies: Secondary | ICD-10-CM

## 2014-06-02 DIAGNOSIS — I119 Hypertensive heart disease without heart failure: Secondary | ICD-10-CM | POA: Insufficient documentation

## 2014-06-02 DIAGNOSIS — I519 Heart disease, unspecified: Secondary | ICD-10-CM | POA: Diagnosis not present

## 2014-06-02 LAB — BASIC METABOLIC PANEL
BUN: 19 mg/dL (ref 6–23)
CALCIUM: 9 mg/dL (ref 8.4–10.5)
CO2: 28 meq/L (ref 19–32)
CREATININE: 1.12 mg/dL (ref 0.40–1.50)
Chloride: 101 mEq/L (ref 96–112)
GFR: 70.71 mL/min (ref >60.00)
GLUCOSE: 191 mg/dL — AB (ref 70–99)
POTASSIUM: 4.2 meq/L (ref 3.5–5.1)
Sodium: 136 mEq/L (ref 135–145)

## 2014-06-02 LAB — MDC_IDC_ENUM_SESS_TYPE_INCLINIC
Battery Remaining Longevity: 129 mo
Battery Voltage: 3.03 V
Brady Statistic RV Percent Paced: 0.01 %
Date Time Interrogation Session: 20160128114414
HighPow Impedance: 190 Ohm
HighPow Impedance: 68 Ohm
Lead Channel Pacing Threshold Amplitude: 0.5 V
Lead Channel Sensing Intrinsic Amplitude: 7.875 mV
Lead Channel Setting Pacing Amplitude: 2 V
Lead Channel Setting Sensing Sensitivity: 0.3 mV
MDC IDC MSMT LEADCHNL RV IMPEDANCE VALUE: 513 Ohm
MDC IDC MSMT LEADCHNL RV PACING THRESHOLD PULSEWIDTH: 0.4 ms
MDC IDC SET LEADCHNL RV PACING PULSEWIDTH: 0.4 ms
MDC IDC SET ZONE DETECTION INTERVAL: 300 ms
MDC IDC SET ZONE DETECTION INTERVAL: 360 ms
Zone Setting Detection Interval: 330 ms
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
## 2014-06-02 NOTE — Patient Instructions (Addendum)
Your physician wants you to follow-up in: 12 months with Dr Jacquiline Doe will receive a reminder letter in the mail two months in advance. If you don't receive a letter, please call our office to schedule the follow-up appointment.  Remote monitoring is used to monitor your Pacemaker or ICD from home. This monitoring reduces the number of office visits required to check your device to one time per year. It allows [place] to keep an eye on the functioning of your device to ensure it is working properly. You are scheduled for a device check from home on 09/01/14. You may send your transmission at any time that day. If you have a wireless device, the transmission will be sent automatically. After your physician reviews your transmission, you will receive a postcard with your next transmission date.  Your physician recommends that you return for lab work today: [facility]   Your physician has recommended you make the following change in your medication:  1) Take  of your fluid pill ( Furosemide) for 3 days  Low-Sodium Eating Plan Sodium raises blood pressure and causes water to be held in the body. Getting less sodium from food will help lower your blood pressure, reduce any swelling, and protect your heart, liver, and kidneys. We get sodium by adding salt (sodium chloride) to food. Most of our sodium comes from canned, boxed, and frozen foods. Restaurant foods, fast foods, and pizza are also very high in sodium. Even if you take medicine to lower your blood pressure or to reduce fluid in your body, getting less sodium from your food is important. WHAT IS MY PLAN? Most people should limit their sodium intake to 2,300 mg a day. Your health care provider recommends that you limit your sodium intake to 2 grams a day.  WHAT DO I NEED TO KNOW ABOUT THIS EATING PLAN? For the low-sodium eating plan, you will follow these general guidelines:  Choose foods with a % Daily Value for sodium of less than 5% (as listed on the  food label).   Use salt-free seasonings or herbs instead of table salt or sea salt.   Check with your health care provider or pharmacist before using salt substitutes.   Eat fresh foods.  Eat more vegetables and fruits.  Limit canned vegetables. If you do use them, rinse them well to decrease the sodium.   Limit cheese to 1 oz (28 g) per day.   Eat lower-sodium products, often labeled as "lower sodium" or "no salt added."  Avoid foods that contain monosodium glutamate (MSG). MSG is sometimes added to [place] food and some canned foods.  Check food labels (Nutrition Facts labels) on foods to learn how much sodium is in one serving.  Eat more home-cooked food and less restaurant, buffet, and fast food.  When eating at a restaurant, ask that your food be prepared with less salt or none, if possible.  HOW DO I READ FOOD LABELS FOR SODIUM INFORMATION? The Nutrition Facts label lists the amount of sodium in one serving of the food. If you eat more than one serving, you must multiply the listed amount of sodium by the number of servings. Food labels may also identify foods as:  Sodium free--Less than 5 mg in a serving.  Very low sodium--35 mg or less in a serving.  Low sodium--140 mg or less in a serving.  Light in sodium--50% less sodium in a serving. For example, if a food that usually has 300 mg of sodium is changed  to become light in sodium, it will have 150 mg of sodium.  Reduced sodium--25% less sodium in a serving. For example, if a food that usually has 400 mg of sodium is changed to reduced sodium, it will have 300 mg of sodium. WHAT FOODS CAN I EAT? Grains Low-sodium cereals, including oats, puffed wheat and rice, and shredded wheat cereals. Low-sodium crackers. Unsalted rice and pasta. Lower-sodium bread.  Vegetables Frozen or fresh vegetables. Low-sodium or reduced-sodium canned vegetables. Low-sodium or reduced-sodium tomato sauce and paste. Low-sodium or  reduced-sodium tomato and vegetable juices.  Fruits Fresh, frozen, and canned fruit. Fruit juice.  Meat and Other Protein Products Low-sodium canned tuna and salmon. Fresh or frozen meat, poultry, seafood, and fish. Lamb. Unsalted nuts. Dried beans, peas, and lentils without added salt. Unsalted canned beans. Homemade soups without salt. Eggs.  Dairy Milk. Soy milk. Ricotta cheese. Low-sodium or reduced-sodium cheeses. Yogurt.  Condiments Fresh and dried herbs and spices. Salt-free seasonings. Onion and garlic powders. Low-sodium varieties of mustard and ketchup. Lemon juice.  Fats and Oils Reduced-sodium salad dressings. Unsalted butter.  Other Unsalted popcorn and pretzels.  The items listed above may not be a complete list of recommended foods or beverages. Contact your dietitian for more options. WHAT FOODS ARE NOT RECOMMENDED? Grains Instant hot cereals. Bread stuffing, pancake, and biscuit mixes. Croutons. Seasoned rice or pasta mixes. Noodle soup cups. Boxed or frozen macaroni and cheese. Self-rising flour. Regular salted crackers. Vegetables Regular canned vegetables. Regular canned tomato sauce and paste. Regular tomato and vegetable juices. Frozen vegetables in sauces. Salted french fries. Olives. Rosita Fire. Relishes. Sauerkraut. Salsa. Meat and Other Protein Products Salted, canned, smoked, spiced, or pickled meats, seafood, or fish. Bacon, ham, sausage, hot dogs, corned beef, chipped beef, and packaged luncheon meats. Salt pork. Jerky. Pickled herring. Anchovies, regular canned tuna, and sardines. Salted nuts. Dairy Processed cheese and cheese spreads. Cheese curds. Blue cheese and cottage cheese. Buttermilk.  Condiments Onion and garlic salt, seasoned salt, table salt, and sea salt. Canned and packaged gravies. Worcestershire sauce. Tartar sauce. Barbecue sauce. Teriyaki sauce. Soy sauce, including reduced sodium. Steak sauce. Fish sauce. Oyster sauce. Cocktail sauce.  Horseradish. Regular ketchup and mustard. Meat flavorings and tenderizers. Bouillon cubes. Hot sauce. Tabasco sauce. Marinades. Taco seasonings. Relishes. Fats and Oils Regular salad dressings. Salted butter. Margarine. Ghee. Bacon fat.  Other Potato and tortilla chips. Corn chips and puffs. Salted popcorn and pretzels. Canned or dried soups. Pizza. Frozen entrees and pot pies.  The items listed above may not be a complete list of foods and beverages to avoid. Contact your dietitian for more information. Document Released: 10/12/2001 Document Revised: 04/27/2013 Document Reviewed: 02/24/2013 [facility] Patient Information 2015 [place], [place]. This information is not intended to replace advice given to you by your health care provider. Make sure you discuss any questions you have with your health care provider.

## 2014-06-08 ENCOUNTER — Encounter: Payer: Self-pay | Admitting: Internal Medicine

## 2014-07-04 ENCOUNTER — Ambulatory Visit (INDEPENDENT_AMBULATORY_CARE_PROVIDER_SITE_OTHER): Payer: BC Managed Care – PPO | Admitting: *Deleted

## 2014-07-04 ENCOUNTER — Encounter: Payer: Self-pay | Admitting: *Deleted

## 2014-07-04 DIAGNOSIS — I5022 Chronic systolic (congestive) heart failure: Secondary | ICD-10-CM

## 2014-07-04 DIAGNOSIS — Z95810 Presence of automatic (implantable) cardiac defibrillator: Secondary | ICD-10-CM

## 2014-07-04 NOTE — Progress Notes (Signed)
EPIC Encounter for ICM Monitoring  Patient Name: Gary Brady is a 64 y.o. male Date: 07/04/2014 Primary Care Physican: Kaleen Mask, MD Primary Cardiologist: Jens Som Electrophysiologist: Allred Dry Weight: 279 lbs      In the past month, have you:  1. Gained more than 2 pounds in a day or more than 5 pounds in a week? Per the patient, his weight flucuates on occasion. He is about 282 lbs today.   2. Had changes in your medications (with verification of current medications)? Yes. The patient saw Dr. Johney Frame on 06/02/14 and his optivol readings were up significantly as the patient was coming off a cruise. His lasix was increased to 20 mg daily x 3 days, then resume PRN dosing.   3. Had more shortness of breath than is usual for you? He did have an episode a few days ago that he felt like he was wheezy. He took a lasix that day at 4:00 pm and he was up frequently during the night urinating. He did feel that his wheezing improved.   4. Limited your activity because of shortness of breath? no  5. Not been able to sleep because of shortness of breath? no  6. Had increased swelling in your feet or ankles? no  7. Had symptoms of dehydration (dizziness, dry mouth, increased thirst, decreased urine output) no  8. Had changes in sodium restriction? no  9. Been compliant with medication? Yes   ICM trend:   Follow-up plan: ICM clinic phone appointment: 08/04/14. No changes made today. The patient was encouraged to continue to monitor his breathing and use his PRN lasix as he has been doing.   Copy of note sent to patient's primary care physician, primary cardiologist, and device following physician.  Sherri Rad, RN, BSN 07/04/2014 1:43 PM

## 2014-07-11 ENCOUNTER — Other Ambulatory Visit: Payer: Self-pay | Admitting: *Deleted

## 2014-07-11 ENCOUNTER — Telehealth: Payer: Self-pay | Admitting: *Deleted

## 2014-07-11 MED ORDER — ATORVASTATIN CALCIUM 80 MG PO TABS: 80.0000 mg | ORAL_TABLET | Freq: Every day | ORAL | Status: DC

## 2014-07-11 NOTE — Telephone Encounter (Signed)
Would defer to Dr Jens Som his primary cardiologist

## 2014-07-12 MED ORDER — POTASSIUM CHLORIDE CRYS ER 20 MEQ PO TBCR: 20.0000 meq | EXTENDED_RELEASE_TABLET | ORAL | Status: DC

## 2014-07-12 NOTE — Telephone Encounter (Signed)
Seen by Jens Som in Sept '15 & Allred Jan 16. Refilled for PRN Potassium. Called pt to inform, he voiced thanks.
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
## 2014-07-25 ENCOUNTER — Encounter: Payer: Self-pay | Admitting: *Deleted

## 2014-07-25 ENCOUNTER — Encounter: Payer: Self-pay | Admitting: Cardiology

## 2014-07-25 ENCOUNTER — Ambulatory Visit (INDEPENDENT_AMBULATORY_CARE_PROVIDER_SITE_OTHER): Payer: BC Managed Care – PPO | Admitting: Cardiology

## 2014-07-25 VITALS — BP 140/82 | HR 76 | Ht 67.0 in | Wt 286.0 lb

## 2014-07-25 DIAGNOSIS — I10 Essential (primary) hypertension: Secondary | ICD-10-CM

## 2014-07-25 DIAGNOSIS — I428 Other cardiomyopathies: Secondary | ICD-10-CM

## 2014-07-25 DIAGNOSIS — I2583 Coronary atherosclerosis due to lipid rich plaque: Secondary | ICD-10-CM

## 2014-07-25 DIAGNOSIS — I2510 Atherosclerotic heart disease of native coronary artery without angina pectoris: Secondary | ICD-10-CM

## 2014-07-25 DIAGNOSIS — Z95810 Presence of automatic (implantable) cardiac defibrillator: Secondary | ICD-10-CM

## 2014-07-25 DIAGNOSIS — I429 Cardiomyopathy, unspecified: Secondary | ICD-10-CM

## 2014-07-25 LAB — BASIC METABOLIC PANEL WITHOUT GFR
BUN: 28 mg/dL — ABNORMAL HIGH (ref 6–23)
CO2: 29 meq/L (ref 19–32)
Calcium: 9.2 mg/dL (ref 8.4–10.5)
Chloride: 101 meq/L (ref 96–112)
Creat: 1.21 mg/dL (ref 0.50–1.35)
GFR, Est African American: 74 mL/min
GFR, Est Non African American: 64 mL/min
Glucose, Bld: 170 mg/dL — ABNORMAL HIGH (ref 70–99)
Potassium: 4.6 meq/L (ref 3.5–5.3)
Sodium: 137 meq/L (ref 135–145)

## 2014-07-25 LAB — HEPATIC FUNCTION PANEL
ALT: 25 U/L (ref 0–53)
AST: 20 U/L (ref 0–37)
Albumin: 4.3 g/dL (ref 3.5–5.2)
Alkaline Phosphatase: 79 U/L (ref 39–117)
Bilirubin, Direct: 0.2 mg/dL (ref 0.0–0.3)
Indirect Bilirubin: 0.4 mg/dL (ref 0.2–1.2)
Total Bilirubin: 0.6 mg/dL (ref 0.2–1.2)
Total Protein: 7 g/dL (ref 6.0–8.3)

## 2014-07-25 LAB — LIPID PANEL
Cholesterol: 90 mg/dL (ref 0–200)
HDL: 31 mg/dL — AB (ref >=40)
LDL CALC: 43 mg/dL (ref 0–99)
TRIGLYCERIDES: 81 mg/dL (ref <150)
Total CHOL/HDL Ratio: 2.9 Ratio
VLDL: 16 mg/dL (ref 0–40)

## 2014-07-25 MED ORDER — AMLODIPINE BESYLATE 5 MG PO TABS: 5.0000 mg | ORAL_TABLET | Freq: Every day | ORAL | Status: DC

## 2014-07-25 NOTE — Assessment & Plan Note (Signed)
Continue ARB and beta blocker. He feels as though his stamina has improved somewhat. Repeat echocardiogram to see if LV function has improved.

## 2014-07-25 NOTE — Assessment & Plan Note (Signed)
Continue statin. Check lipids and liver. 

## 2014-07-25 NOTE — Assessment & Plan Note (Signed)
Followed by electrophysiology. 

## 2014-07-25 NOTE — Patient Instructions (Addendum)
Your physician wants you to follow-up in: ONE YEAR WITH DR Shelda Pal will receive a reminder letter in the mail two months in advance. If you don't receive a letter, please call our office to schedule the follow-up appointment.   Your physician recommends that you HAVE LAB WORK TODAY  START AMLODIPINE 5 MG ONCE DAILY  Your physician has requested that you have an echocardiogram. Echocardiography is a painless test that uses sound waves to create images of your heart. It provides your doctor with information about the size and shape of your heart and how well your heart's chambers and valves are working. This procedure takes approximately one hour. There are no restrictions for this procedure.    Marland Kitchen

## 2014-07-25 NOTE — Assessment & Plan Note (Signed)
Continue present dose of diuretics.he will take an additional 40 mg of Lasix for weight gain of 2-3 pounds. Check potassium and renal function.

## 2014-07-25 NOTE — Assessment & Plan Note (Signed)
Blood pressure is reasonable but with reduced LV function I would like it lower. Add amlodipine 5 mg daily and follow.

## 2014-07-25 NOTE — Assessment & Plan Note (Signed)
Continue aspirin and statin. 

## 2014-07-26 ENCOUNTER — Encounter: Payer: Self-pay | Admitting: *Deleted

## 2014-07-29 ENCOUNTER — Ambulatory Visit (HOSPITAL_COMMUNITY)
Admission: RE | Admit: 2014-07-29 | Discharge: 2014-07-29 | Disposition: A | Discharge status: Home / Self Care | Payer: BC Managed Care – PPO | Source: Ambulatory Visit | Attending: Cardiology | Admitting: Cardiology

## 2014-07-29 DIAGNOSIS — I2510 Atherosclerotic heart disease of native coronary artery without angina pectoris: Secondary | ICD-10-CM | POA: Diagnosis not present

## 2014-07-29 DIAGNOSIS — I429 Cardiomyopathy, unspecified: Secondary | ICD-10-CM | POA: Diagnosis not present

## 2014-07-29 NOTE — Progress Notes (Signed)
2D Echocardiogram Complete.  07/29/2014   Dian Minahan, RDCS  

## 2014-08-02 ENCOUNTER — Telehealth: Payer: Self-pay | Admitting: Cardiology

## 2014-08-02 NOTE — Telephone Encounter (Signed)
Ok to DC amlodipine and follow BP. [name]

## 2014-08-02 NOTE — Telephone Encounter (Signed)
New message      Pt c/o medication issue:  1. Name of Medication: amlodipine 2. How are you currently taking this medication (dosage and times per day)? 5mg  daily  3. Are you having a reaction (difficulty breathing--STAT)? no 4. What is your medication issue?   Took rx yesterday for the first time---got dizzy and almost passed out.  He did not take a pill today.  He did not take his bp or HR.  Please advise

## 2014-08-02 NOTE — Telephone Encounter (Signed)
Spoke with pt, he suddenly got dizzy yesterday after standing and got dizzy climbing up the stairs. He is not able to check his bp but states he is going to get a cuff. Told patient not to take anymore and will let dr Jens Som know

## 2014-08-03 NOTE — Telephone Encounter (Signed)
Left message on patients voicemail of dr Ludwig Clarks recommendations.

## 2014-08-04 ENCOUNTER — Ambulatory Visit (INDEPENDENT_AMBULATORY_CARE_PROVIDER_SITE_OTHER): Payer: BC Managed Care – PPO | Admitting: *Deleted

## 2014-08-04 ENCOUNTER — Encounter: Payer: Self-pay | Admitting: *Deleted

## 2014-08-04 DIAGNOSIS — I5022 Chronic systolic (congestive) heart failure: Secondary | ICD-10-CM | POA: Diagnosis not present

## 2014-08-04 DIAGNOSIS — Z95810 Presence of automatic (implantable) cardiac defibrillator: Secondary | ICD-10-CM | POA: Diagnosis not present

## 2014-08-04 NOTE — Progress Notes (Signed)
EPIC Encounter for ICM Monitoring  Patient Name: Gary Brady is a 64 y.o. male Date: 08/04/2014 Primary Care Physican: Kaleen Mask, MD Primary Cardiologist: Jens Som Electrophysiologist: Allred Dry Weight: 279 lbs       In the past month, have you:  1. Gained more than 2 pounds in a day or more than 5 pounds in a week? no  2. Had changes in your medications (with verification of current medications)? no  3. Had more shortness of breath than is usual for you? no  4. Limited your activity because of shortness of breath? no  5. Not been able to sleep because of shortness of breath? no  6. Had increased swelling in your feet or ankles? no  7. Had symptoms of dehydration (dizziness, dry mouth, increased thirst, decreased urine output) no  8. Had changes in sodium restriction? no  9. Been compliant with medication? Yes   ICM trend:   Follow-up plan: ICM clinic phone appointment: 09/05/14  Copy of note sent to patient's primary care physician, primary cardiologist, and device following physician.  Sherri Rad, RN, BSN 08/04/2014 3:10 PM

## 2014-09-01 ENCOUNTER — Ambulatory Visit: Payer: BC Managed Care – PPO

## 2014-09-05 ENCOUNTER — Ambulatory Visit (INDEPENDENT_AMBULATORY_CARE_PROVIDER_SITE_OTHER): Payer: BC Managed Care – PPO | Admitting: *Deleted

## 2014-09-05 ENCOUNTER — Other Ambulatory Visit: Payer: Self-pay | Admitting: *Deleted

## 2014-09-05 ENCOUNTER — Encounter: Payer: Self-pay | Admitting: *Deleted

## 2014-09-05 DIAGNOSIS — I5022 Chronic systolic (congestive) heart failure: Secondary | ICD-10-CM | POA: Diagnosis not present

## 2014-09-05 DIAGNOSIS — I2510 Atherosclerotic heart disease of native coronary artery without angina pectoris: Secondary | ICD-10-CM

## 2014-09-05 DIAGNOSIS — I429 Cardiomyopathy, unspecified: Secondary | ICD-10-CM | POA: Diagnosis not present

## 2014-09-05 DIAGNOSIS — Z95810 Presence of automatic (implantable) cardiac defibrillator: Secondary | ICD-10-CM

## 2014-09-05 LAB — CUP PACEART REMOTE DEVICE CHECK
Brady Statistic RV Percent Paced: 0 %
Date Time Interrogation Session: 20160502120515
HighPow Impedance: 75 Ohm
Lead Channel Sensing Intrinsic Amplitude: 8.875 mV
Lead Channel Setting Pacing Amplitude: 2 V
Lead Channel Setting Pacing Pulse Width: 0.4 ms
Lead Channel Setting Sensing Sensitivity: 0.3 mV
MDC IDC MSMT BATTERY REMAINING LONGEVITY: 127 mo
MDC IDC MSMT BATTERY VOLTAGE: 3.02 V
MDC IDC MSMT LEADCHNL RV IMPEDANCE VALUE: 399 Ohm
MDC IDC MSMT LEADCHNL RV IMPEDANCE VALUE: 513 Ohm
MDC IDC MSMT LEADCHNL RV PACING THRESHOLD AMPLITUDE: 0.5 V
MDC IDC MSMT LEADCHNL RV PACING THRESHOLD PULSEWIDTH: 0.4 ms
MDC IDC MSMT LEADCHNL RV SENSING INTR AMPL: 8.875 mV
MDC IDC SET ZONE DETECTION INTERVAL: 330 ms
Zone Setting Detection Interval: 300 ms
Zone Setting Detection Interval: 360 ms

## 2014-09-05 MED ORDER — CARVEDILOL 25 MG PO TABS: 25.0000 mg | ORAL_TABLET | Freq: Two times a day (BID) | ORAL | Status: DC

## 2014-09-05 NOTE — Progress Notes (Signed)
EPIC Encounter for ICM Monitoring  Patient Name: Gary Brady is a 64 y.o. male Date: 09/05/2014 Primary Care Physican: Kaleen Mask, MD Primary Cardiologist: Jens Som Electrophysiologist: Allred Dry Weight: 279 lbs      In the past month, have you:  1. Gained more than 2 pounds in a day or more than 5 pounds in a week? no  2. Had changes in your medications (with verification of current medications)? no  3. Had more shortness of breath than is usual for you? no  4. Limited your activity because of shortness of breath? no  5. Not been able to sleep because of shortness of breath? no  6. Had increased swelling in your feet or ankles? no  7. Had symptoms of dehydration (dizziness, dry mouth, increased thirst, decreased urine output) no  8. Had changes in sodium restriction? no  9. Been compliant with medication? Yes   ICM trend:   Follow-up plan: ICM clinic phone appointment: 10/06/14. No changes made today.  Copy of note sent to patient's primary care physician, primary cardiologist, and device following physician.  Sherri Rad, RN, BSN 09/05/2014 5:57 PM

## 2014-09-06 NOTE — Progress Notes (Signed)
Remote ICD transmission.   

## 2014-09-13 ENCOUNTER — Other Ambulatory Visit: Payer: Self-pay

## 2014-09-13 MED ORDER — SPIRONOLACTONE 25 MG PO TABS: 12.5000 mg | ORAL_TABLET | Freq: Two times a day (BID) | ORAL | Status: DC

## 2014-09-15 ENCOUNTER — Encounter: Payer: Self-pay | Admitting: Cardiology

## 2014-09-21 ENCOUNTER — Encounter: Payer: Self-pay | Admitting: Internal Medicine

## 2014-09-29 ENCOUNTER — Encounter: Payer: Self-pay | Admitting: Cardiology

## 2014-10-06 ENCOUNTER — Ambulatory Visit (INDEPENDENT_AMBULATORY_CARE_PROVIDER_SITE_OTHER): Payer: BC Managed Care – PPO | Admitting: *Deleted

## 2014-10-06 DIAGNOSIS — Z95810 Presence of automatic (implantable) cardiac defibrillator: Secondary | ICD-10-CM

## 2014-10-06 DIAGNOSIS — I5022 Chronic systolic (congestive) heart failure: Secondary | ICD-10-CM | POA: Diagnosis not present

## 2014-10-10 ENCOUNTER — Encounter: Payer: Self-pay | Admitting: *Deleted

## 2014-10-10 NOTE — Progress Notes (Signed)
EPIC Encounter for ICM Monitoring  Patient Name: Gary Brady is a 64 y.o. male Date: 10/10/2014 Primary Care Physican: Kaleen Mask, MD Primary Cardiologist: Jens Som Electrophysiologist: Allred Dry Weight: 279 lbs       In the past month, have you:  1. Gained more than 2 pounds in a day or more than 5 pounds in a week? no  2. Had changes in your medications (with verification of current medications)? no  3. Had more shortness of breath than is usual for you? no  4. Limited your activity because of shortness of breath? no  5. Not been able to sleep because of shortness of breath? no  6. Had increased swelling in your feet or ankles? no  7. Had symptoms of dehydration (dizziness, dry mouth, increased thirst, decreased urine output) no  8. Had changes in sodium restriction? no  9. Been compliant with medication? Yes   ICM trend:   Follow-up plan: ICM clinic phone appointment: 11/10/14. The patient is doing well. He reports that he is quite exhausted from the weekend as his first grand-baby (boy) was born on Friday!! No changes made today.   Copy of note sent to patient's primary care physician, primary cardiologist, and device following physician.  Sherri Rad, RN, BSN 10/10/2014 11:32 AM

## 2014-11-05 IMAGING — CT CT CHEST W/O CM
2 of 4 series · 15 of 36 positions shown, 18 images · IV contrast (Omnipaque 300)
Comparison: 11/26/2012.

CLINICAL DATA: Followup pulmonary nodules.

EXAM:
CT CHEST WITHOUT CONTRAST
TECHNIQUE: Multidetector CT imaging of the chest was performed following the
standard protocol without IV contrast..

[Series 2: chest routine with · axial · 0.89mm/px · z∈[-270,-30]mm · 12 of 58 slices shown, 15 images]
[im 5/58  mediastinal]
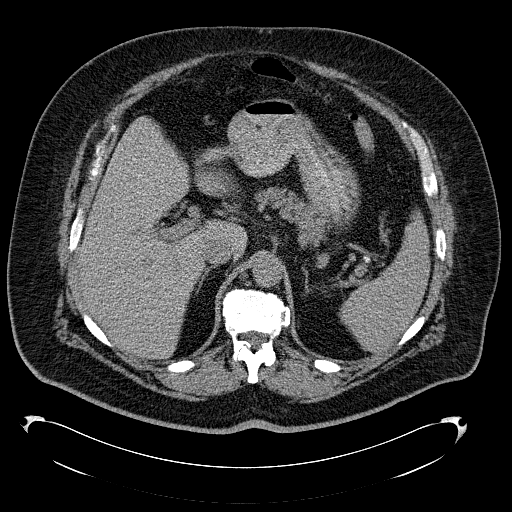
[im 5/58  lung]
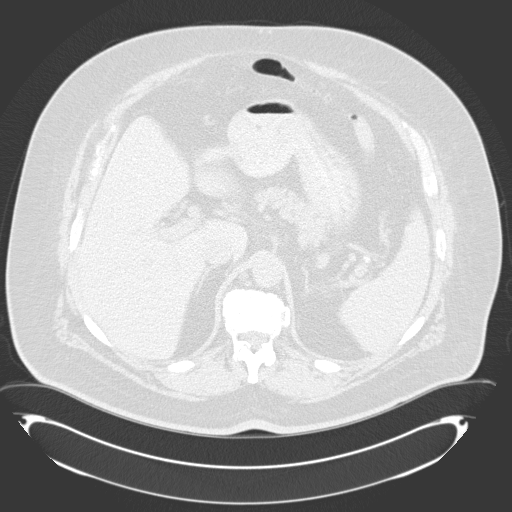
[im 9/58  lung]
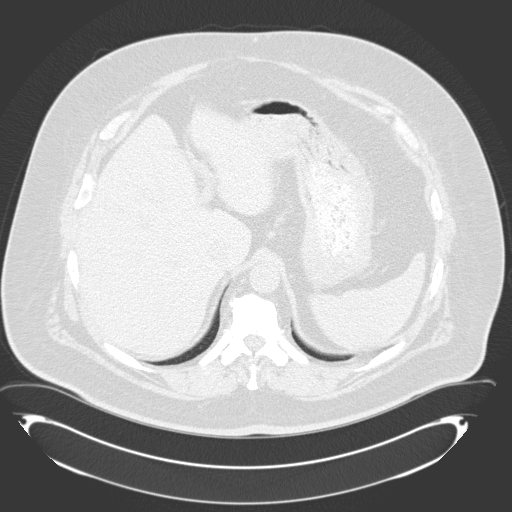
[im 14/58  lung]
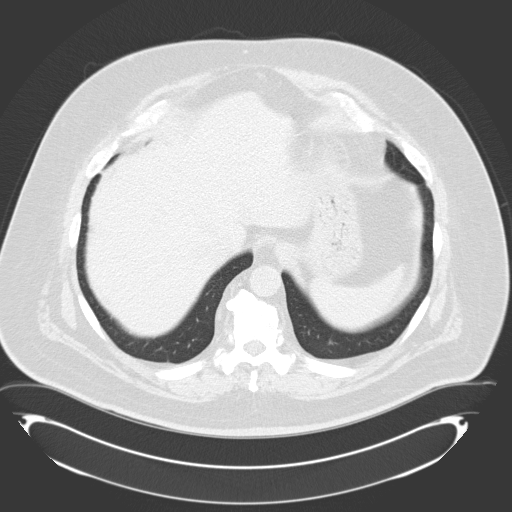
[im 18/58  lung]
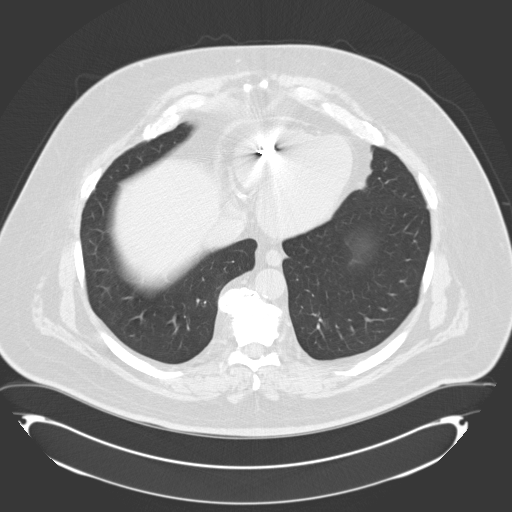
[im 22/58  mediastinal]
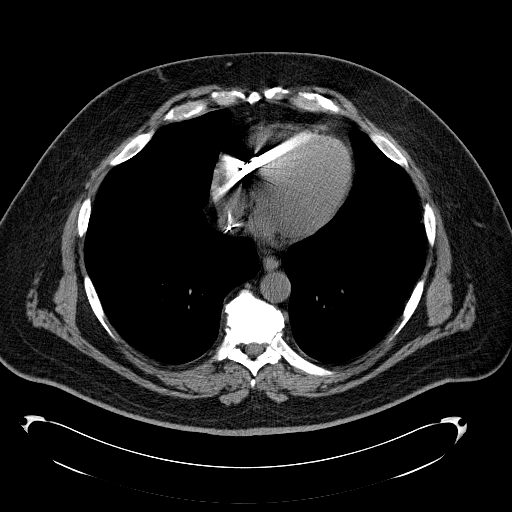
[im 22/58  lung]
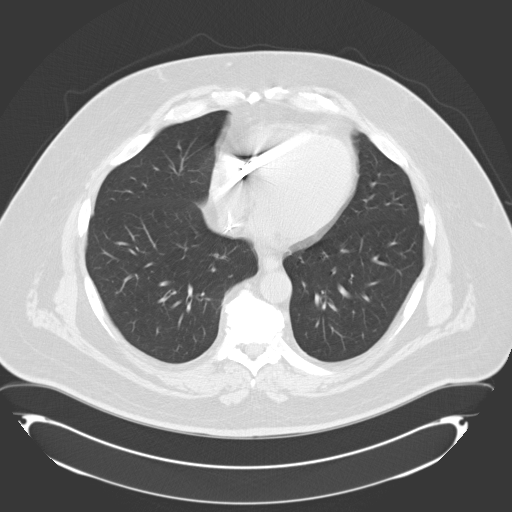
[im 27/58  lung]
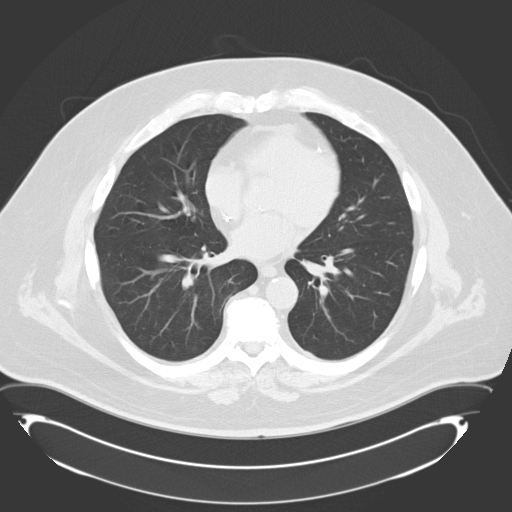
[im 31/58  lung]
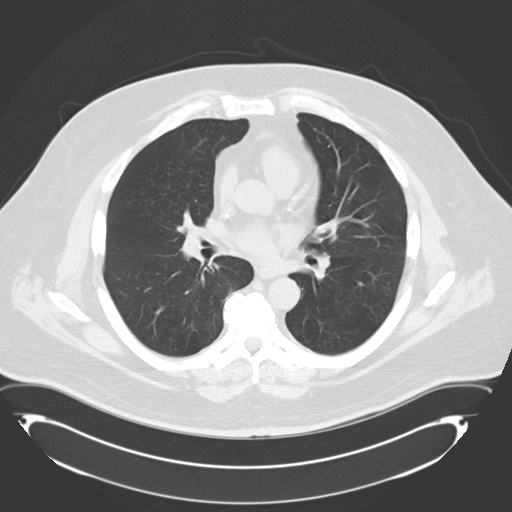
[im 36/58  lung]
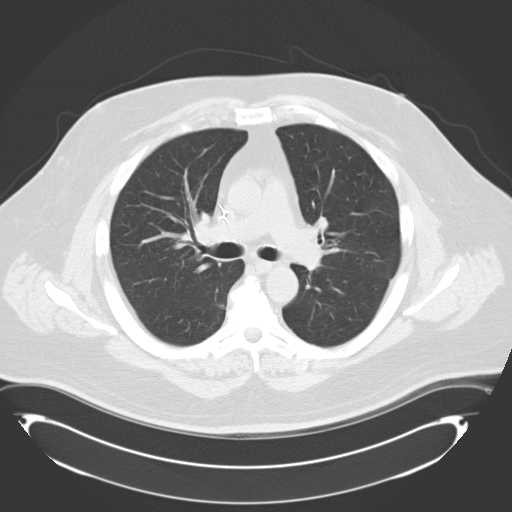
[im 40/58  mediastinal]
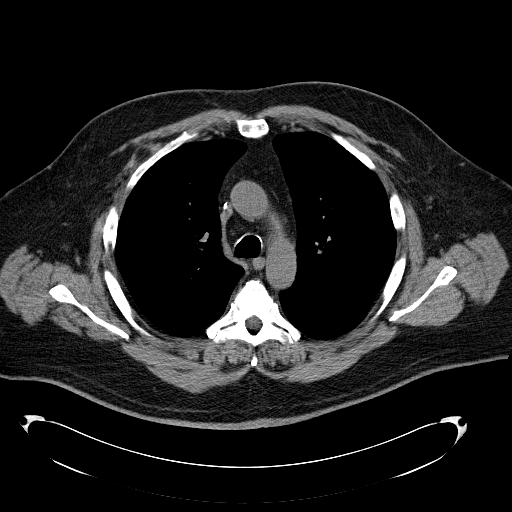
[im 40/58  lung]
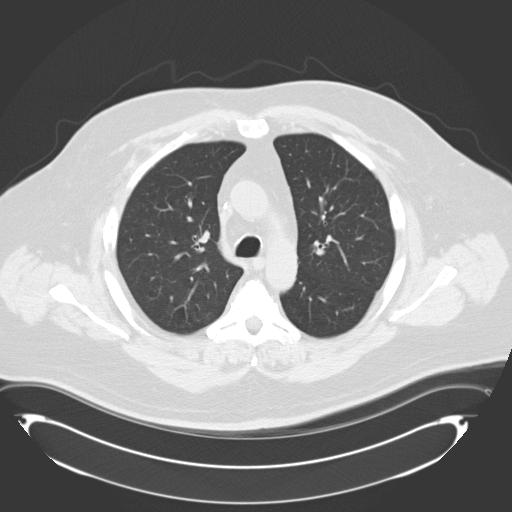
[im 44/58  lung]
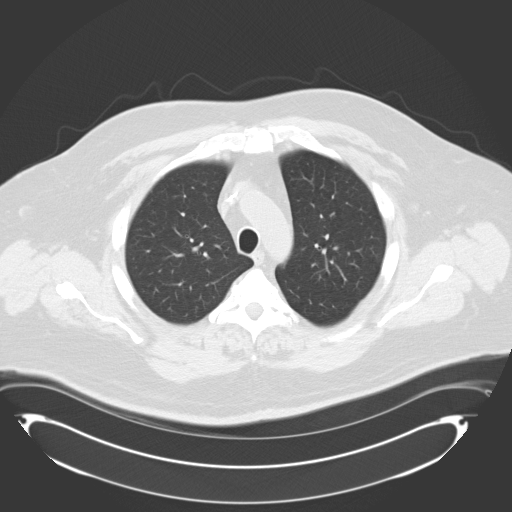
[im 49/58  lung]
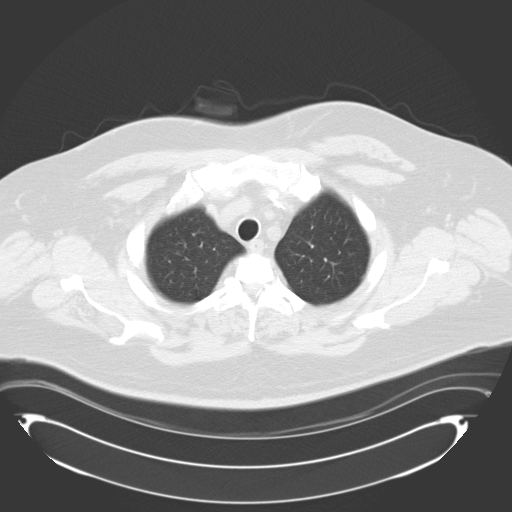
[im 53/58  lung]
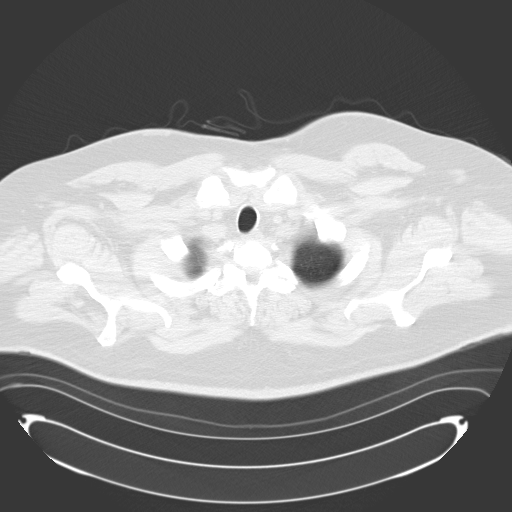

[Series 602: cor · coronal · 0.89mm/px · 3 of 139 slices shown]
[im 28/139  lung]
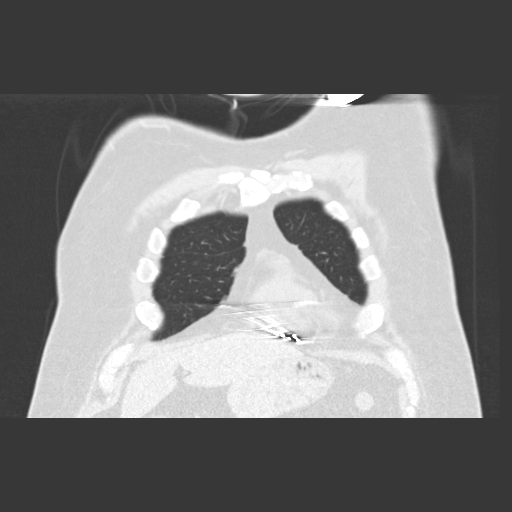
[im 56/139  lung]
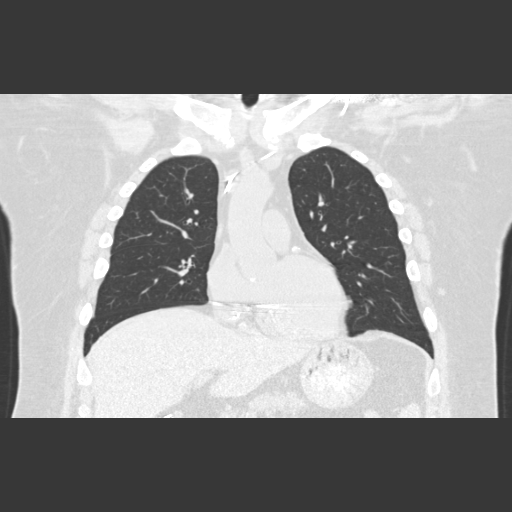
[im 83/139  lung]
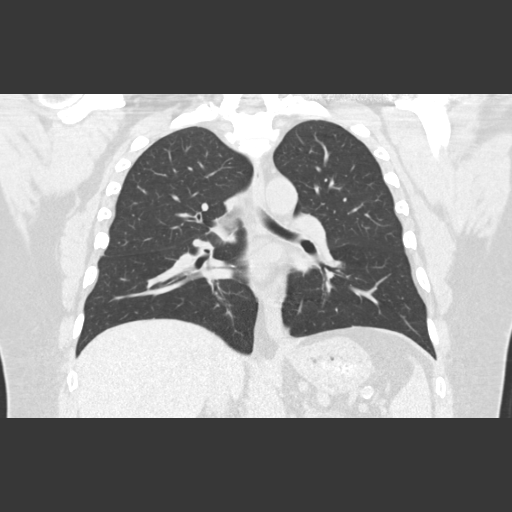

[15 of 36 positions shown; findings below may reference images not displayed]

FINDINGS: No pathologically enlarged mediastinal, hilar or axillary lymph
nodes. ICD lead tip terminates in the right ventricle. Heart size
normal. No pericardial effusion.

Mild subpleural scarring in the medial right lower lobe. Previously
seen 2 mm left lower lobe nodule is no longer visualized. A 3 mm
anterior left lower lobe nodule (series 3, image 40) is unchanged
and therefore benign. No pleural fluid. Airway is unremarkable.

Incidental imaging of the upper abdomen shows the visualized
portions of the liver, adrenal glands, spleen, pancreas, stomach and
bowel to be grossly unremarkable. Cholecystectomy. No upper
abdominal adenopathy. No worrisome lytic or sclerotic lesions.
Degenerative changes are seen in the spine.
IMPRESSION: Resolved or stable pulmonary nodules, benign.

## 2014-11-10 ENCOUNTER — Ambulatory Visit (INDEPENDENT_AMBULATORY_CARE_PROVIDER_SITE_OTHER): Payer: BC Managed Care – PPO | Admitting: *Deleted

## 2014-11-10 DIAGNOSIS — I5022 Chronic systolic (congestive) heart failure: Secondary | ICD-10-CM | POA: Diagnosis not present

## 2014-11-10 DIAGNOSIS — Z95810 Presence of automatic (implantable) cardiac defibrillator: Secondary | ICD-10-CM

## 2014-11-11 ENCOUNTER — Encounter: Payer: Self-pay | Admitting: *Deleted

## 2014-11-11 NOTE — Progress Notes (Signed)
EPIC Encounter for ICM Monitoring  Patient Name: Gary Brady is a 64 y.o. male Date: 11/11/2014 Primary Care Physican: Kaleen Mask, MD Primary Cardiologist: Jens Som Electrophysiologist: Allred Dry Weight: 279 lbs       In the past month, have you:  1. Gained more than 2 pounds in a day or more than 5 pounds in a week? no  2. Had changes in your medications (with verification of current medications)? no  3. Had more shortness of breath than is usual for you? no  4. Limited your activity because of shortness of breath? no  5. Not been able to sleep because of shortness of breath? no  6. Had increased swelling in your feet or ankles? no  7. Had symptoms of dehydration (dizziness, dry mouth, increased thirst, decreased urine output) no  8. Had changes in sodium restriction? no  9. Been compliant with medication? Yes   ICM trend:   Follow-up plan: ICM clinic phone appointment: 12/15/14. No changes today.  Copy of note sent to patient's primary care physician, primary cardiologist, and device following physician.  Sherri Rad, RN, BSN 11/11/2014 3:45 PM  .

## 2014-12-15 ENCOUNTER — Ambulatory Visit (INDEPENDENT_AMBULATORY_CARE_PROVIDER_SITE_OTHER): Payer: BC Managed Care – PPO | Admitting: *Deleted

## 2014-12-15 DIAGNOSIS — Z95810 Presence of automatic (implantable) cardiac defibrillator: Secondary | ICD-10-CM | POA: Diagnosis not present

## 2014-12-15 DIAGNOSIS — I428 Other cardiomyopathies: Secondary | ICD-10-CM

## 2014-12-15 DIAGNOSIS — I5022 Chronic systolic (congestive) heart failure: Secondary | ICD-10-CM | POA: Diagnosis not present

## 2014-12-15 DIAGNOSIS — I429 Cardiomyopathy, unspecified: Secondary | ICD-10-CM

## 2014-12-15 NOTE — Progress Notes (Signed)
Remote ICD transmission.   

## 2014-12-16 ENCOUNTER — Encounter: Payer: Self-pay | Admitting: *Deleted

## 2014-12-16 NOTE — Progress Notes (Signed)
EPIC Encounter for ICM Monitoring  Patient Name: Gary Brady is a 64 y.o. male Date: 12/16/2014 Primary Care Physican: Kaleen Mask, MD Primary Cardiologist: Jens Som Electrophysiologist: Allred Dry Weight: 279 lbs       In the past month, have you:  1. Gained more than 2 pounds in a day or more than 5 pounds in a week? no  2. Had changes in your medications (with verification of current medications)? no  3. Had more shortness of breath than is usual for you? no  4. Limited your activity because of shortness of breath? no  5. Not been able to sleep because of shortness of breath? no  6. Had increased swelling in your feet or ankles? no  7. Had symptoms of dehydration (dizziness, dry mouth, increased thirst, decreased urine output) no  8. Had changes in sodium restriction? no  9. Been compliant with medication? Yes   ICM trend:   Follow-up plan: ICM clinic phone appointment: 01/17/15. No changes made today.  Copy of note sent to patient's primary care physician, primary cardiologist, and device following physician.  Sherri Rad, RN, BSN 12/16/2014 4:01 PM

## 2014-12-21 LAB — CUP PACEART REMOTE DEVICE CHECK
Battery Remaining Longevity: 125 mo
Brady Statistic RV Percent Paced: 0 %
Date Time Interrogation Session: 20160811062827
HighPow Impedance: 79 Ohm
Lead Channel Impedance Value: 437 Ohm
Lead Channel Pacing Threshold Amplitude: 0.5 V
Lead Channel Pacing Threshold Pulse Width: 0.4 ms
Lead Channel Setting Pacing Pulse Width: 0.4 ms
Lead Channel Setting Sensing Sensitivity: 0.3 mV
MDC IDC MSMT BATTERY VOLTAGE: 3.01 V
MDC IDC MSMT LEADCHNL RV IMPEDANCE VALUE: 494 Ohm
MDC IDC MSMT LEADCHNL RV SENSING INTR AMPL: 7 mV
MDC IDC SET LEADCHNL RV PACING AMPLITUDE: 2 V
Zone Setting Detection Interval: 300 ms
Zone Setting Detection Interval: 330 ms
Zone Setting Detection Interval: 360 ms

## 2014-12-27 ENCOUNTER — Encounter: Payer: Self-pay | Admitting: Cardiology

## 2014-12-29 ENCOUNTER — Encounter: Payer: Self-pay | Admitting: Internal Medicine

## 2015-01-10 ENCOUNTER — Encounter: Payer: Self-pay | Admitting: Cardiology

## 2015-01-17 ENCOUNTER — Ambulatory Visit (INDEPENDENT_AMBULATORY_CARE_PROVIDER_SITE_OTHER): Payer: BC Managed Care – PPO

## 2015-01-17 DIAGNOSIS — I5022 Chronic systolic (congestive) heart failure: Secondary | ICD-10-CM

## 2015-01-17 DIAGNOSIS — Z95810 Presence of automatic (implantable) cardiac defibrillator: Secondary | ICD-10-CM

## 2015-01-19 NOTE — Progress Notes (Signed)
EPIC Encounter for ICM Monitoring  Patient Name: Gary Brady is a 64 y.o. male Date: 01/19/2015 Primary Care Physican: Kaleen Mask, MD Primary Cardiologist: Jens Som Electrophysiologist: Allred Dry Weight: 273 lbs       In the past month, have you:  1. Gained more than 2 pounds in a day or more than 5 pounds in a week? no  2. Had changes in your medications (with verification of current medications)? no  3. Had more shortness of breath than is usual for you? Yes  4. Limited your activity because of shortness of breath? no  5. Not been able to sleep because of shortness of breath? no  6. Had increased swelling in your feet or ankles? no  7. Had symptoms of dehydration (dizziness, dry mouth, increased thirst, decreased urine output) no  8. Had changes in sodium restriction? no  9. Been compliant with medication? Yes   ICM trend:   Follow-up plan: ICM clinic phone appointment 02/23/2015.  Optivol daily impedance below baseline ~01/07/2015 to 01/17/2015 and patient stated he thought he was retaining fluid.  He reported he gets SOB and hands swelling if he has fluid retention.  Patient on Lasix 40 mg 1 tablet prn and Potassium 1 tablet when Lasix is taken.  Asked if he has taken any Lasix in the last week and he said no.  He denied changes in diet and follows low sodium diet.     Advised to take Lasix 40 mg 1 tablet x 2 days with Potassium 20 meq as prescribed when taking Lasix and he verbalized understanding.  Education given to call office if symptoms do not improve or worsen.   Copy of note sent to patient's primary care physician, primary cardiologist, and device following physician.  [name], RN, CCM 01/19/2015 1:34 PM

## 2015-02-08 ENCOUNTER — Other Ambulatory Visit: Payer: Self-pay | Admitting: Cardiology

## 2015-02-15 ENCOUNTER — Other Ambulatory Visit: Payer: Self-pay | Admitting: Cardiology

## 2015-02-23 ENCOUNTER — Telehealth: Payer: Self-pay | Admitting: Cardiology

## 2015-02-23 NOTE — Telephone Encounter (Signed)
LMOVM reminding pt to send remote transmission.   

## 2015-02-28 ENCOUNTER — Telehealth: Payer: Self-pay

## 2015-02-28 NOTE — Telephone Encounter (Signed)
ICM transmission received.  Attempted call to patient and no answer.  Optivol impedance below baseline ~02/14/2015 to 02/19/2015.   Next transmission scheduled for 03/16/2015.

## 2015-03-16 ENCOUNTER — Telehealth: Payer: Self-pay | Admitting: Cardiology

## 2015-03-16 ENCOUNTER — Ambulatory Visit (INDEPENDENT_AMBULATORY_CARE_PROVIDER_SITE_OTHER): Payer: BC Managed Care – PPO | Admitting: *Deleted

## 2015-03-16 DIAGNOSIS — Z95810 Presence of automatic (implantable) cardiac defibrillator: Secondary | ICD-10-CM

## 2015-03-16 DIAGNOSIS — I5022 Chronic systolic (congestive) heart failure: Secondary | ICD-10-CM

## 2015-03-16 DIAGNOSIS — I429 Cardiomyopathy, unspecified: Secondary | ICD-10-CM

## 2015-03-16 DIAGNOSIS — I428 Other cardiomyopathies: Secondary | ICD-10-CM

## 2015-03-16 DIAGNOSIS — I509 Heart failure, unspecified: Secondary | ICD-10-CM | POA: Diagnosis not present

## 2015-03-16 NOTE — Telephone Encounter (Signed)
LMOVM reminding pt to send remote transmission.   

## 2015-03-17 NOTE — Progress Notes (Signed)
EPIC Encounter for ICM Monitoring  Patient Name: Gary Brady is a 64 y.o. male Date: 03/17/2015 Primary Care Physican: Kaleen Mask, MD Primary Cardiologist: Jens Som Electrophysiologist: Allred Dry Weight: 271 lb       In the past month, have you:  1. Gained more than 2 pounds in a day or more than 5 pounds in a week? no  2. Had changes in your medications (with verification of current medications)? no  3. Had more shortness of breath than is usual for you? no  4. Limited your activity because of shortness of breath? no  5. Not been able to sleep because of shortness of breath? no  6. Had increased swelling in your feet or ankles? no  7. Had symptoms of dehydration (dizziness, dry mouth, increased thirst, decreased urine output) no  8. Had changes in sodium restriction? no  9. Been compliant with medication? Yes   ICM trend:   Follow-up plan: ICM clinic phone appointment on 03/21/2015 for repeat transmission.  Optivol daily thoracic impedance below baseline ~03/10/2015 to 03/16/2015 with the start of trending back toward baseline on day of transmission suggesting fluid retention.  Also below baseline 02/22/2015 to 02/27/2015 and 03/01/2015 to 03/04/2015.  He reported having swelling in hands and little shortness of breath around 03/01/2015 to 03/04/2015 and took Lasix and Potassium as ordered prn x 2 days.  Optivol above baseline after taking Lasix.  He stated he will take a Lasix and Potassium tomorrow as prescribed for prn use and see how he feels.  He reported he will be leaving on cruise to [place] on 03/23/2015 and wants to make sure he is not retaining fluid before he goes.  Will do a repeat transmission on 03/21/2015 to check he for any fluid retention.    Copy of note sent to patient's primary care physician, primary cardiologist, and device following physician.  [name], RN, CCM 03/17/2015 1:46 PM

## 2015-03-21 ENCOUNTER — Ambulatory Visit (INDEPENDENT_AMBULATORY_CARE_PROVIDER_SITE_OTHER): Payer: BC Managed Care – PPO

## 2015-03-21 DIAGNOSIS — Z95810 Presence of automatic (implantable) cardiac defibrillator: Secondary | ICD-10-CM

## 2015-03-21 DIAGNOSIS — I5022 Chronic systolic (congestive) heart failure: Secondary | ICD-10-CM

## 2015-03-21 NOTE — Progress Notes (Signed)
EPIC Encounter for ICM Monitoring  Patient Name: Gary Brady is a 64 y.o. male Date: 03/21/2015 Primary Care Physican: Kaleen Mask, MD Primary Cardiologist: Jens Som Electrophysiologist: Allred Dry Weight: 270 lbs       In the past month, have you:  1. Gained more than 2 pounds in a day or more than 5 pounds in a week? no  2. Had changes in your medications (with verification of current medications)? no  3. Had more shortness of breath than is usual for you? no  4. Limited your activity because of shortness of breath? no  5. Not been able to sleep because of shortness of breath? no  6. Had increased swelling in your feet or ankles? no  7. Had symptoms of dehydration (dizziness, dry mouth, increased thirst, decreased urine output) no  8. Had changes in sodium restriction? no  9. Been compliant with medication? Yes   ICM trend:  03/21/2015   Follow-up plan: ICM clinic phone appointment 04/17/2015.  Optivol thoracic impedance back to baseline within the last 2 days after taking prn Lasix and Potassium on 03/2015 and 03/18/2015.   He reported he is feeling fine.  No problems today.  He leaves for [place] cruise this week and confirmed he will be taking the prn Lasix and Potassium.  No changes today.    Copy of note sent to patient's primary care physician, primary cardiologist, and device following physician.  [name], RN, CCM 03/21/2015 1:21 PM

## 2015-04-17 ENCOUNTER — Ambulatory Visit (INDEPENDENT_AMBULATORY_CARE_PROVIDER_SITE_OTHER): Payer: BC Managed Care – PPO

## 2015-04-17 DIAGNOSIS — I5022 Chronic systolic (congestive) heart failure: Secondary | ICD-10-CM

## 2015-04-17 DIAGNOSIS — Z95810 Presence of automatic (implantable) cardiac defibrillator: Secondary | ICD-10-CM

## 2015-04-17 MED ORDER — FUROSEMIDE 40 MG PO TABS: 20.0000 mg | ORAL_TABLET | Freq: Every day | ORAL | Status: DC | PRN

## 2015-04-17 NOTE — Progress Notes (Signed)
EPIC Encounter for ICM Monitoring  Patient Name: Gary Brady is a 64 y.o. male Date: 04/17/2015 Primary Care Physican: Kaleen Mask, MD Primary Cardiologist: Jens Som Electrophysiologist: Allred Dry Weight: 274 lb       In the past month, have you:  Gained more than 2 pounds in a day or more than 5 pounds in a week?  Gained 4 lbs over the last 2 weeks.  1. Had changes in your medications (with verification of current medications)? no  2. Had more shortness of breath than is usual for you? Yes  3. Limited your activity because of shortness of breath? no  4. Not been able to sleep because of shortness of breath? no  5. Had increased swelling in your feet or ankles? no  6. Had symptoms of dehydration (dizziness, dry mouth, increased thirst, decreased urine output) no  7. Had changes in sodium restriction? no  8. Been compliant with medication? Yes   ICM trend: 04/17/2015   Follow-up plan: ICM clinic phone appointment on 05/02/2015.  Optivol thoracic impedance below baseline ~ 03/23/2015 to 04/17/2015 suggesting fluid retention.  He reported symptoms of SOB and weight gain.  He stated he was on a cruise during part of that time followed by the holiday so he ate foods higher in sodium.   He can tell he is retaining some fluid.          Advised to take Furosemide 40 mg - 1 tablet daily x 3 days and Potassium 20 meq - 1 tablet daily x 3 days.  He stated he needed a refill on Furosemide and reorder completed to [facility] as requested.      Will forward to Dr Jens Som and Dr Johney Frame for review and any further recommendations.    Copy of note sent to patient's primary care physician, primary cardiologist, and device following physician.  [name], RN, CCM 04/17/2015 2:39 PM

## 2015-05-02 ENCOUNTER — Ambulatory Visit (INDEPENDENT_AMBULATORY_CARE_PROVIDER_SITE_OTHER): Payer: BC Managed Care – PPO

## 2015-05-02 ENCOUNTER — Telehealth: Payer: Self-pay | Admitting: Cardiology

## 2015-05-02 DIAGNOSIS — Z95810 Presence of automatic (implantable) cardiac defibrillator: Secondary | ICD-10-CM

## 2015-05-02 DIAGNOSIS — I5022 Chronic systolic (congestive) heart failure: Secondary | ICD-10-CM | POA: Diagnosis not present

## 2015-05-02 NOTE — Telephone Encounter (Signed)
Spoke with pt and reminded pt of remote transmission that is due today. Pt verbalized understanding.   

## 2015-05-03 NOTE — Progress Notes (Signed)
EPIC Encounter for ICM Monitoring  Patient Name: Gary Brady is a 64 y.o. male Date: 05/03/2015 Primary Care Physican: Kaleen Mask, MD Primary Cardiologist: Jens Som Electrophysiologist: Allred Dry Weight: 276 lb       In the past month, have you:  1. Gained more than 2 pounds in a day or more than 5 pounds in a week? no  2. Had changes in your medications (with verification of current medications)? no  3. Had more shortness of breath than is usual for you? no  4. Limited your activity because of shortness of breath? no  5. Not been able to sleep because of shortness of breath? no  6. Had increased swelling in your feet or ankles? no  7. Had symptoms of dehydration (dizziness, dry mouth, increased thirst, decreased urine output) no  8. Had changes in sodium restriction? no  9. Been compliant with medication? Yes   ICM trend: 05/03/2015   Follow-up plan: ICM clinic phone appointment on 07/06/2015 and in office appointment with Dr Johney Frame on 06/05/2015.  Optivol impedance above baseline after taking extra Furosemide and Potassium following last ICM transmission on 04/17/2015.  He stated he is feeling good.   Denied any HF symptoms.  No changes today.  Will follow up remotely after office appointment.    Copy of note sent to patient's primary care physician, primary cardiologist, and device following physician.  [name], RN, CCM 05/03/2015 12:47 PM

## 2015-06-05 ENCOUNTER — Encounter: Payer: BC Managed Care – PPO | Admitting: Internal Medicine

## 2015-07-06 ENCOUNTER — Ambulatory Visit (INDEPENDENT_AMBULATORY_CARE_PROVIDER_SITE_OTHER): Payer: BC Managed Care – PPO

## 2015-07-06 DIAGNOSIS — I5022 Chronic systolic (congestive) heart failure: Secondary | ICD-10-CM

## 2015-07-06 DIAGNOSIS — Z95810 Presence of automatic (implantable) cardiac defibrillator: Secondary | ICD-10-CM

## 2015-07-19 ENCOUNTER — Ambulatory Visit (INDEPENDENT_AMBULATORY_CARE_PROVIDER_SITE_OTHER): Payer: BC Managed Care – PPO

## 2015-07-19 DIAGNOSIS — I5022 Chronic systolic (congestive) heart failure: Secondary | ICD-10-CM | POA: Diagnosis not present

## 2015-07-19 DIAGNOSIS — Z95810 Presence of automatic (implantable) cardiac defibrillator: Secondary | ICD-10-CM

## 2015-07-19 NOTE — Progress Notes (Signed)
EPIC Encounter for ICM Monitoring  Patient Name: Gary Brady is a 65 y.o. male Date: 07/19/2015 Primary Care Physican: Kaleen Mask, MD Primary Cardiologist: Jens Som Electrophysiologist: Allred Dry Weight: Unknown   In the past month, have you:  1. Gained more than 2 pounds in a day or more than 5 pounds in a week? N/A  2. Had changes in your medications (with verification of current medications)? N/A  3. Had more shortness of breath than is usual for you? N/A  4. Limited your activity because of shortness of breath? N/A  5. Not been able to sleep because of shortness of breath? N/A  6. Had increased swelling in your feet or ankles? N/A  7. Had symptoms of dehydration (dizziness, dry mouth, increased thirst, decreased urine output) N/A  8. Had changes in sodium restriction? N/A  9. Been compliant with medication? N/A   ICM trend: 3 month view for 07/06/2015  ICM trend: 1 year view for 07/06/2015   Follow-up plan: ICM clinic phone appointment 07/19/2015.  Spoke with patient today regarding 07/06/2015 transmission.  He stated he feels like he may have some fluid and I requested he send new transmission today, 07/19/2015.  Will call him back once transmission received.   [name], RN, CCM 07/19/2015 9:10 AM

## 2015-07-19 NOTE — Progress Notes (Signed)
  EPIC Encounter for ICM Monitoring  Patient Name: Gary Brady is a 65 y.o. male Date: 07/19/2015 Primary Care Physican: Kaleen Mask, MD Primary Cardiologist: Jens Som Electrophysiologist: Allred Dry Weight: 275 lbs   In the past month, have you:  1. Gained more than 2 pounds in a day or more than 5 pounds in a week? no  2. Had changes in your medications (with verification of current medications)? no  3. Had more shortness of breath than is usual for you? no  4. Limited your activity because of shortness of breath? no  5. Not been able to sleep because of shortness of breath? no  6. Had increased swelling in your feet or ankles? no  7. Had symptoms of dehydration (dizziness, dry mouth, increased thirst, decreased urine output) no  8. Had changes in sodium restriction? no  9. Been compliant with medication? Yes   ICM trend: 3 month view for 07/19/2015   ICM trend: 1 year view for 07/19/2015   Follow-up plan: ICM clinic phone appointment 08/30/2015 and has an office appointment with Dr Johney Frame 07/31/2015.  Patient canceled office appointment with Dr Johney Frame on 06/05/2015.  Thoracic impedance below reference line from 07/04/2015 to 07/09/2015 suggesting fluid accumulation.  Patient reported he has been having chest congestion but unsure if it is related to his cold or fluid.  Advised thoracic impedance returned to his baseline 07/10/2015 and fluid levels appear to be stable.   He reported no other fluid symptoms.  He has not needed to take additional Furosemide in the last month but was advised by Dr Jens Som he can take if needed for fluid weight gain.  Encouraged to call for any fluid symptoms.  No changes today.    [name], RN, CCM 07/19/2015 9:31 AM

## 2015-07-24 ENCOUNTER — Other Ambulatory Visit: Payer: Self-pay | Admitting: Cardiology

## 2015-07-24 NOTE — Telephone Encounter (Signed)
Rx request sent to pharmacy.  

## 2015-07-31 ENCOUNTER — Ambulatory Visit (INDEPENDENT_AMBULATORY_CARE_PROVIDER_SITE_OTHER): Payer: BC Managed Care – PPO | Admitting: Internal Medicine

## 2015-07-31 ENCOUNTER — Encounter: Payer: Self-pay | Admitting: Internal Medicine

## 2015-07-31 VITALS — BP 134/88 | HR 92 | Ht 67.0 in | Wt 280.0 lb

## 2015-07-31 DIAGNOSIS — I519 Heart disease, unspecified: Secondary | ICD-10-CM

## 2015-07-31 DIAGNOSIS — Z95810 Presence of automatic (implantable) cardiac defibrillator: Secondary | ICD-10-CM | POA: Diagnosis not present

## 2015-07-31 DIAGNOSIS — I429 Cardiomyopathy, unspecified: Secondary | ICD-10-CM

## 2015-07-31 DIAGNOSIS — I428 Other cardiomyopathies: Secondary | ICD-10-CM
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
## 2015-07-31 NOTE — Patient Instructions (Signed)
Medication Instructions:  Your physician recommends that you continue on your current medications as directed. Please refer to the Current Medication list given to you today.   Labwork: None ordered   Testing/Procedures: None ordered   Follow-Up: Your physician recommends that you schedule a follow-up appointment in: Dr Jens Som next available    Your physician wants you to follow-up in: 12 months with Dr Johney Frame Bonita Quin will receive a reminder letter in the mail two months in advance. If you don't receive a letter, please call our office to schedule the follow-up appointment.  Remote monitoring is used to monitor your  ICD from home. This monitoring reduces the number of office visits required to check your device to one time per year. It allows [place] to keep an eye on the functioning of your device to ensure it is working properly. You are scheduled for a device check from home on 10/30/15. You may send your transmission at any time that day. If you have a wireless device, the transmission will be sent automatically. After your physician reviews your transmission, you will receive a postcard with your next transmission date.     Any Other Special Instructions Will Be Listed Below (If Applicable).     If you need a refill on your cardiac medications before your next appointment, please call your pharmacy.
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
## 2015-08-04 NOTE — Addendum Note (Signed)
Addended by: Reesa Chew on: 08/04/2015 08:32 AM   Modules accepted: Orders

## 2015-08-10 ENCOUNTER — Encounter: Payer: Self-pay | Admitting: Cardiology

## 2015-08-10 ENCOUNTER — Ambulatory Visit (INDEPENDENT_AMBULATORY_CARE_PROVIDER_SITE_OTHER): Payer: BC Managed Care – PPO | Admitting: Cardiology

## 2015-08-10 DIAGNOSIS — I2583 Coronary atherosclerosis due to lipid rich plaque: Principal | ICD-10-CM

## 2015-08-10 DIAGNOSIS — I10 Essential (primary) hypertension: Secondary | ICD-10-CM | POA: Diagnosis not present

## 2015-08-10 DIAGNOSIS — E785 Hyperlipidemia, unspecified: Secondary | ICD-10-CM

## 2015-08-10 DIAGNOSIS — Z95810 Presence of automatic (implantable) cardiac defibrillator: Secondary | ICD-10-CM

## 2015-08-10 DIAGNOSIS — I2510 Atherosclerotic heart disease of native coronary artery without angina pectoris: Secondary | ICD-10-CM

## 2015-08-10 DIAGNOSIS — I5022 Chronic systolic (congestive) heart failure: Secondary | ICD-10-CM

## 2015-08-10 NOTE — Patient Instructions (Signed)
Medication Instructions:  Your physician recommends that you continue on your current medications as directed. Please refer to the Current Medication list given to you today.   Labwork: Your physician recommends that you return for lab work in: FASTING (lipid/liver/bmet) The lab can be found on the FIRST FLOOR of out building in [address]   Testing/Procedures: none  Follow-Up: Your physician wants you to follow-up in: Dr. Jens Som in 12 months. You will receive a reminder letter in the mail two months in advance. If you don't receive a letter, please call our office to schedule the follow-up appointment.   Any Other Special Instructions Will Be Listed Below (If Applicable).     If you need a refill on your cardiac medications before your next appointment, please call your pharmacy.

## 2015-08-10 NOTE — Assessment & Plan Note (Signed)
Continue ARB and beta blocker. 

## 2015-08-10 NOTE — Assessment & Plan Note (Signed)
Blood pressure controlled. Continue present medications. 

## 2015-08-10 NOTE — Assessment & Plan Note (Signed)
Followed by electrophysiology. 

## 2015-08-10 NOTE — Assessment & Plan Note (Signed)
Continue statin. Check lipids and liver. 

## 2015-08-10 NOTE — Assessment & Plan Note (Signed)
Continue aspirin and statin. 

## 2015-08-10 NOTE — Assessment & Plan Note (Signed)
Euvolemic on examination. Continue present dose of diuretics. Check potassium and renal function. 

## 2015-08-30 ENCOUNTER — Ambulatory Visit (INDEPENDENT_AMBULATORY_CARE_PROVIDER_SITE_OTHER): Payer: BC Managed Care – PPO

## 2015-08-30 DIAGNOSIS — Z95810 Presence of automatic (implantable) cardiac defibrillator: Secondary | ICD-10-CM

## 2015-08-30 DIAGNOSIS — I5022 Chronic systolic (congestive) heart failure: Secondary | ICD-10-CM

## 2015-08-30 NOTE — Progress Notes (Signed)
EPIC Encounter for ICM Monitoring  Patient Name: Gary Brady is a 65 y.o. male Date: 08/30/2015 Primary Care Physican: Kaleen Mask, MD Primary Cardiologist: Jens Som Electrophysiologist: Allred Dry Weight: 275 lbs   In the past month, have you:  1. Gained more than 2 pounds in a day or more than 5 pounds in a week? no  2. Had changes in your medications (with verification of current medications)? no  3. Had more shortness of breath than is usual for you? no  4. Limited your activity because of shortness of breath? no  5. Not been able to sleep because of shortness of breath? no  6. Had increased swelling in your feet or ankles? no  7. Had symptoms of dehydration (dizziness, dry mouth, increased thirst, decreased urine output) no  8. Had changes in sodium restriction? no  9. Been compliant with medication? Yes  ICM trend: 3 month view for 08/29/2015  ICM trend: 1 year view for 08/29/2015  Follow-up plan: ICM clinic phone appointment 10/03/2015.    FLUID LEVELS: Optivol thoracic impedance decreased 08/19/2015 to 08/25/2015 suggesting fluid accumulation and returned to reference line on 08/26/2015 suggesting stabilization of fluid levels.   SYMPTOMS: None. Encouraged to call for any fluid symptoms.   No changes today.    [name], RN, CCM 08/30/2015 11:55 AM

## 2015-08-31 ENCOUNTER — Other Ambulatory Visit: Payer: Self-pay | Admitting: Cardiology

## 2015-09-01 NOTE — Telephone Encounter (Signed)
Rx(s) sent to pharmacy electronically.  

## 2015-10-03 ENCOUNTER — Ambulatory Visit (INDEPENDENT_AMBULATORY_CARE_PROVIDER_SITE_OTHER): Payer: BC Managed Care – PPO

## 2015-10-03 DIAGNOSIS — Z95810 Presence of automatic (implantable) cardiac defibrillator: Secondary | ICD-10-CM | POA: Diagnosis not present

## 2015-10-03 DIAGNOSIS — I5022 Chronic systolic (congestive) heart failure: Secondary | ICD-10-CM | POA: Diagnosis not present

## 2015-10-03 NOTE — Progress Notes (Addendum)
EPIC Encounter for ICM Monitoring  Patient Name: Gary Brady is a 65 y.o. male Date: 10/03/2015 Primary Care Physican: Kaleen Mask, MD Primary Cardiologist: Jens Som Electrophysiologist: Allred Dry Weight: unknown   In the past month, have you:  1. Gained more than 2 pounds in a day or more than 5 pounds in a week? N/A  2. Had changes in your medications (with verification of current medications)? N/A  3. Had more shortness of breath than is usual for you? N/A  4. Limited your activity because of shortness of breath? N/A  5. Not been able to sleep because of shortness of breath? N/A  6. Had increased swelling in your feet or ankles? N/A  7. Had symptoms of dehydration (dizziness, dry mouth, increased thirst, decreased urine output) N/A  8. Had changes in sodium restriction? N/A  9. Been compliant with medication? N/A   ICM trend: 3 month view for 10/03/2015   ICM trend: 1 year view for 10/03/2015   Follow-up plan: ICM clinic phone appointment on 11/08/2015.  Attempted call to patient and unable to reach.  Transmission reviewed.  Thoracic impedance below reference starting 09/26/2015 and returning to baseline 10/02/2015.    Unable to reach patient and no changes today.    [name], RN, CCM 10/03/2015 3:09 PM

## 2015-10-13 LAB — CUP PACEART REMOTE DEVICE CHECK
Battery Remaining Longevity: 123 mo
Date Time Interrogation Session: 20161110231619
HIGH POWER IMPEDANCE MEASURED VALUE: 80 Ohm
Implantable Lead Location: 753860
Lead Channel Impedance Value: 437 Ohm
Lead Channel Pacing Threshold Pulse Width: 0.4 ms
Lead Channel Sensing Intrinsic Amplitude: 6.375 mV
Lead Channel Setting Sensing Sensitivity: 0.3 mV
MDC IDC LEAD IMPLANT DT: 20141202
MDC IDC MSMT BATTERY VOLTAGE: 3.01 V
MDC IDC MSMT LEADCHNL RV IMPEDANCE VALUE: 532 Ohm
MDC IDC MSMT LEADCHNL RV PACING THRESHOLD AMPLITUDE: 0.5 V
MDC IDC SET LEADCHNL RV PACING AMPLITUDE: 2 V
MDC IDC SET LEADCHNL RV PACING PULSEWIDTH: 0.4 ms
MDC IDC STAT BRADY RV PERCENT PACED: 0 %

## 2015-10-13 LAB — CUP PACEART INCLINIC DEVICE CHECK
Battery Remaining Longevity: 120 mo
Brady Statistic RV Percent Paced: 0.01 %
HIGH POWER IMPEDANCE MEASURED VALUE: 75 Ohm
Implantable Lead Location: 753860
Lead Channel Impedance Value: 513 Ohm
Lead Channel Pacing Threshold Pulse Width: 0.4 ms
Lead Channel Setting Pacing Amplitude: 2 V
Lead Channel Setting Pacing Pulse Width: 0.4 ms
Lead Channel Setting Sensing Sensitivity: 0.3 mV
MDC IDC LEAD IMPLANT DT: 20141202
MDC IDC MSMT BATTERY VOLTAGE: 3.01 V
MDC IDC MSMT LEADCHNL RV IMPEDANCE VALUE: 399 Ohm
MDC IDC MSMT LEADCHNL RV PACING THRESHOLD AMPLITUDE: 0.625 V
MDC IDC MSMT LEADCHNL RV SENSING INTR AMPL: 8.375 mV
MDC IDC SESS DTM: 20170327154034

## 2015-11-08 ENCOUNTER — Ambulatory Visit (INDEPENDENT_AMBULATORY_CARE_PROVIDER_SITE_OTHER): Payer: BC Managed Care – PPO | Admitting: *Deleted

## 2015-11-08 DIAGNOSIS — Z95810 Presence of automatic (implantable) cardiac defibrillator: Secondary | ICD-10-CM | POA: Diagnosis not present

## 2015-11-08 DIAGNOSIS — I5022 Chronic systolic (congestive) heart failure: Secondary | ICD-10-CM

## 2015-11-08 DIAGNOSIS — I428 Other cardiomyopathies: Secondary | ICD-10-CM

## 2015-11-08 DIAGNOSIS — I429 Cardiomyopathy, unspecified: Secondary | ICD-10-CM

## 2015-11-08 NOTE — Progress Notes (Signed)
EPIC Encounter for ICM Monitoring  Patient Name: Gary Brady is a 65 y.o. male Date: 11/08/2015 Primary Care Physican: Kaleen Mask, MD Primary Cardiologist: Jens Som Electrophysiologist: Allred Dry Weight: 275 lb   Heart Failure questions reviewed, pt asymptomatic  Thoracic impedence stable.  Patient manages fluid symptoms with prn Lasix but has not needed in the last month.  Low sodium diet education provided   ICM trend: 11/08/2015     Follow-up plan: ICM clinic phone appointment on 12/12/2015.  Copy of ICM check sent to device physician.   [name], RN 11/08/2015 11:12 AM

## 2015-11-08 NOTE — Progress Notes (Signed)
Remote ICD transmission.   

## 2015-11-10 LAB — CUP PACEART REMOTE DEVICE CHECK
Battery Voltage: 3.01 V
Brady Statistic RV Percent Paced: 0.01 %
Date Time Interrogation Session: 20170703073427
HIGH POWER IMPEDANCE MEASURED VALUE: 73 Ohm
Implantable Lead Implant Date: 20141202
Implantable Lead Location: 753860
Lead Channel Impedance Value: 494 Ohm
Lead Channel Pacing Threshold Amplitude: 0.5 V
Lead Channel Pacing Threshold Pulse Width: 0.4 ms
Lead Channel Sensing Intrinsic Amplitude: 8.125 mV
Lead Channel Sensing Intrinsic Amplitude: 8.125 mV
Lead Channel Setting Pacing Amplitude: 2 V
MDC IDC MSMT BATTERY REMAINING LONGEVITY: 117 mo
MDC IDC MSMT LEADCHNL RV IMPEDANCE VALUE: 380 Ohm
MDC IDC SET LEADCHNL RV PACING PULSEWIDTH: 0.4 ms
MDC IDC SET LEADCHNL RV SENSING SENSITIVITY: 0.3 mV

## 2015-11-15 ENCOUNTER — Encounter: Payer: Self-pay | Admitting: Cardiology

## 2015-11-24 ENCOUNTER — Encounter (INDEPENDENT_AMBULATORY_CARE_PROVIDER_SITE_OTHER): Payer: BC Managed Care – PPO | Admitting: Ophthalmology

## 2015-11-24 DIAGNOSIS — I10 Essential (primary) hypertension: Secondary | ICD-10-CM | POA: Diagnosis not present

## 2015-11-24 DIAGNOSIS — H33302 Unspecified retinal break, left eye: Secondary | ICD-10-CM

## 2015-11-24 DIAGNOSIS — H43813 Vitreous degeneration, bilateral: Secondary | ICD-10-CM

## 2015-11-24 DIAGNOSIS — H35033 Hypertensive retinopathy, bilateral: Secondary | ICD-10-CM | POA: Diagnosis not present

## 2015-11-30 ENCOUNTER — Ambulatory Visit (INDEPENDENT_AMBULATORY_CARE_PROVIDER_SITE_OTHER): Payer: BC Managed Care – PPO | Admitting: Ophthalmology

## 2015-11-30 DIAGNOSIS — H33302 Unspecified retinal break, left eye: Secondary | ICD-10-CM

## 2015-12-12 ENCOUNTER — Ambulatory Visit (INDEPENDENT_AMBULATORY_CARE_PROVIDER_SITE_OTHER): Payer: BC Managed Care – PPO

## 2015-12-12 ENCOUNTER — Telehealth: Payer: Self-pay | Admitting: Cardiology

## 2015-12-12 ENCOUNTER — Telehealth: Payer: Self-pay

## 2015-12-12 DIAGNOSIS — I5022 Chronic systolic (congestive) heart failure: Secondary | ICD-10-CM

## 2015-12-12 DIAGNOSIS — Z95810 Presence of automatic (implantable) cardiac defibrillator: Secondary | ICD-10-CM

## 2015-12-12 NOTE — Progress Notes (Signed)
EPIC Encounter for ICM Monitoring  Patient Name: Gary Brady is a 65 y.o. male Date: 12/12/2015 Primary Care Physican: Kaleen Mask, MD Primary Cardiologist: Jens Som Electrophysiologist: Allred Dry Weight: 272 lb       Heart Failure questions reviewed, pt symptomatic with SOB and tiredness in the last few days.   Thoracic impedance abnormal suggesting fluid accumulation 11/04/2015 to 11/25/2015 and 12/05/2015 to 12/12/2015.  Recommendations:  Recommended to take Furosemide 40 mg 1/2 tablet x 3 days and then return to prn.  Also take Potassium 20 mEq on the days he takes Lasix and then return to prn as directed.  Recheck fluid levels on 12/18/2015.  ICM trend: 12/12/2015     Follow-up plan: ICM clinic phone appointment on 12/18/2015.  Copy of ICM check sent to primary cardiologist and device physician.   [name], RN 12/12/2015 2:45 PM

## 2015-12-12 NOTE — Telephone Encounter (Signed)
Remote ICM transmission received.  Attempted patient call and left message with wife to return call  

## 2015-12-12 NOTE — Telephone Encounter (Signed)
Confirmed remote transmission w/ pt wife.   

## 2015-12-18 ENCOUNTER — Ambulatory Visit (INDEPENDENT_AMBULATORY_CARE_PROVIDER_SITE_OTHER): Payer: BC Managed Care – PPO

## 2015-12-18 DIAGNOSIS — Z95810 Presence of automatic (implantable) cardiac defibrillator: Secondary | ICD-10-CM

## 2015-12-18 DIAGNOSIS — I5022 Chronic systolic (congestive) heart failure: Secondary | ICD-10-CM

## 2015-12-18 NOTE — Progress Notes (Signed)
EPIC Encounter for ICM Monitoring  Patient Name: Gary Brady is a 65 y.o. male Date: 12/18/2015 Primary Care Physican: Kaleen Mask, MD Primary Cardiologist: Jens Som Electrophysiologist: Allred Dry Weight: unknown        Heart Failure questions reviewed, pt asymptomatic  Since ICM transmission on 12/12/2015 and after taking Furosemide x 3 days with Potassium, thoracic impedance returned to baseline but dropped below baseline again on 12/14/2015 to 12/18/2015 suggesting fluid accumulation.  He reported he thinks he is drinking too much fluids.    Recommendations:  Take Furosemide 40 mg 0.5 tablet x 3 days and then return to prn dosage.  Recommended to take Potassium 20 mEq 1 tablet x 3 days while taking Lasix and then return to prn dosage. Limit fluid intake to 64 oz daily.     Follow-up plan: ICM clinic phone appointment on 12/22/2015 to recheck fluid levels.  Copy of ICM check sent to primary cardiologist and device physician for review.   ICM trend: 12/18/2015       Gary Soda, RN 12/18/2015 10:16 AM

## 2015-12-22 ENCOUNTER — Ambulatory Visit (INDEPENDENT_AMBULATORY_CARE_PROVIDER_SITE_OTHER): Payer: BC Managed Care – PPO

## 2015-12-22 DIAGNOSIS — I5022 Chronic systolic (congestive) heart failure: Secondary | ICD-10-CM

## 2015-12-22 DIAGNOSIS — Z95810 Presence of automatic (implantable) cardiac defibrillator: Secondary | ICD-10-CM

## 2015-12-22 NOTE — Progress Notes (Signed)
EPIC Encounter for ICM Monitoring  Patient Name: Gary Brady is a 65 y.o. male Date: 12/22/2015 Primary Care Physican: Kaleen Mask, MD Primary Cardiologist: Jens Som Electrophysiologist: Allred Dry Weight: unknown      Heart Failure questions reviewed, pt asymptomatic  Since 12/18/2015 transmission and taking 3 days of extra Furosemide, thoracic impedance returned to normal.  Recommendations: No changes.  Low sodium diet education provided.    Follow-up plan: ICM clinic phone appointment on 01/22/2016.  Copy of ICM check sent to primary cardiologist and device physician.   ICM trend: 12/22/2015       [name], RN 12/22/2015 11:59 AM

## 2016-01-22 ENCOUNTER — Ambulatory Visit (INDEPENDENT_AMBULATORY_CARE_PROVIDER_SITE_OTHER): Payer: BC Managed Care – PPO

## 2016-01-22 ENCOUNTER — Telehealth: Payer: Self-pay | Admitting: Cardiology

## 2016-01-22 DIAGNOSIS — I5022 Chronic systolic (congestive) heart failure: Secondary | ICD-10-CM | POA: Diagnosis not present

## 2016-01-22 DIAGNOSIS — Z95810 Presence of automatic (implantable) cardiac defibrillator: Secondary | ICD-10-CM | POA: Diagnosis not present

## 2016-01-22 NOTE — Telephone Encounter (Signed)
Spoke with pt and reminded pt of remote transmission that is due today. Pt verbalized understanding.   

## 2016-01-23 ENCOUNTER — Telehealth: Payer: Self-pay

## 2016-01-23 NOTE — Telephone Encounter (Signed)
Remote ICM transmission received.  Attempted patient call and left message for return call.   

## 2016-01-23 NOTE — Progress Notes (Signed)
EPIC Encounter for ICM Monitoring  Patient Name: Gary Brady is a 65 y.o. male Date: 01/23/2016 Primary Care Physican: Kaleen Mask, MD Primary Cardiologist:Crenshaw Electrophysiologist: Allred Dry Weight: unknown       Attempted ICM call and unable to reach.  Transmission reviewed.   Thoracic impedance abnormal suggesting fluid accumulation.  Recommendations:  Unable to reach.  Follow-up plan: ICM clinic phone appointment on 02/08/2016 to recheck fluid level.  Copy of ICM check sent to primary cardiologist and device physician.   ICM trend: 01/23/2016       [name], RN 01/23/2016 9:59 AM

## 2016-02-04 ENCOUNTER — Other Ambulatory Visit: Payer: Self-pay | Admitting: Cardiology

## 2016-02-05 NOTE — Telephone Encounter (Signed)
Rx request sent to pharmacy.  

## 2016-02-08 ENCOUNTER — Encounter: Payer: BC Managed Care – PPO | Admitting: *Deleted

## 2016-02-08 ENCOUNTER — Telehealth: Payer: Self-pay | Admitting: Cardiology

## 2016-02-08 NOTE — Telephone Encounter (Signed)
LMOVM reminding pt to send remote transmission.   

## 2016-02-09 ENCOUNTER — Encounter: Payer: Self-pay | Admitting: Cardiology

## 2016-02-13 NOTE — Progress Notes (Signed)
No ICM remote transmission received on 02/08/2016.   Next ICM transmission scheduled for 02/28/2016.

## 2016-02-15 ENCOUNTER — Ambulatory Visit (INDEPENDENT_AMBULATORY_CARE_PROVIDER_SITE_OTHER): Payer: BC Managed Care – PPO | Admitting: *Deleted

## 2016-02-15 DIAGNOSIS — I5022 Chronic systolic (congestive) heart failure: Secondary | ICD-10-CM

## 2016-02-15 DIAGNOSIS — I428 Other cardiomyopathies: Secondary | ICD-10-CM

## 2016-02-15 DIAGNOSIS — Z95810 Presence of automatic (implantable) cardiac defibrillator: Secondary | ICD-10-CM

## 2016-02-16 ENCOUNTER — Encounter: Payer: Self-pay | Admitting: Cardiology

## 2016-02-16 ENCOUNTER — Telehealth: Payer: Self-pay

## 2016-02-16 NOTE — Telephone Encounter (Signed)
Remote ICM transmission received.  Attempted patient call and left detailed message regarding transmission and next ICM scheduled for 03/18/2016.  Advised to return call for any fluid symptoms or questions.    

## 2016-02-16 NOTE — Progress Notes (Signed)
Remote ICD transmission.   

## 2016-02-16 NOTE — Progress Notes (Signed)
EPIC Encounter for ICM Monitoring  Patient Name: Gary Brady is a 65 y.o. male Date: 02/16/2016 Primary Care Physican: Kaleen Mask, MD Primary Cardiologist:Crenshaw Electrophysiologist: Allred Dry Weight:    unknown      Attempted ICM call and unable to reach. Left detailed message regarding transmission. Transmission reviewed.   Thoracic impedance normal     Follow-up plan: ICM clinic phone appointment on 03/18/2016.  Copy of ICM check sent to device physician.   ICM trend: 02/15/2016     [name], RN 02/16/2016 12:02 PM

## 2016-03-18 ENCOUNTER — Ambulatory Visit (INDEPENDENT_AMBULATORY_CARE_PROVIDER_SITE_OTHER): Payer: BC Managed Care – PPO

## 2016-03-18 DIAGNOSIS — Z95810 Presence of automatic (implantable) cardiac defibrillator: Secondary | ICD-10-CM | POA: Diagnosis not present

## 2016-03-18 DIAGNOSIS — I5022 Chronic systolic (congestive) heart failure: Secondary | ICD-10-CM | POA: Diagnosis not present

## 2016-03-18 LAB — CUP PACEART REMOTE DEVICE CHECK
Battery Remaining Longevity: 113 mo
Battery Voltage: 3.01 V
HighPow Impedance: 78 Ohm
Implantable Lead Location: 753860
Implantable Lead Model: 6935
Implantable Pulse Generator Implant Date: 20141202
Lead Channel Impedance Value: 399 Ohm
Lead Channel Pacing Threshold Pulse Width: 0.4 ms
Lead Channel Sensing Intrinsic Amplitude: 6 mV
Lead Channel Sensing Intrinsic Amplitude: 6 mV
Lead Channel Setting Pacing Amplitude: 2 V
Lead Channel Setting Pacing Pulse Width: 0.4 ms
Lead Channel Setting Sensing Sensitivity: 0.3 mV
MDC IDC LEAD IMPLANT DT: 20141202
MDC IDC MSMT LEADCHNL RV IMPEDANCE VALUE: 494 Ohm
MDC IDC MSMT LEADCHNL RV PACING THRESHOLD AMPLITUDE: 0.5 V
MDC IDC SESS DTM: 20171012210805
MDC IDC STAT BRADY RV PERCENT PACED: 0.01 %

## 2016-03-18 NOTE — Progress Notes (Signed)
Normal remote reviewed.  Next [facility] 05/16/16. Also followed in ICM clinic.  

## 2016-03-18 NOTE — Progress Notes (Signed)
EPIC Encounter for ICM Monitoring  Patient Name: Gary Brady is a 65 y.o. male Date: 03/18/2016 Primary Care Physican: Kaleen Mask, MD Primary Cardiologist:Crenshaw Electrophysiologist: Allred Dry Weight: 272 lb        Heart Failure questions reviewed, pt asymptomatic   Thoracic impedance abnormal suggesting fluid accumulation which he correlates with having a cold the last couple of weeks.  Recommendations:  Take Furosemide 40 mg 1/2 tablet (20 mg total) x 3 days and then return to prn.  Take Klor Con 20 mEq 1 tablet x 3 days when taking Lasix and then return to prn.    Follow-up plan: ICM clinic phone appointment on 03/25/2016 to recheck fluid levels.  Copy of ICM check sent to primary cardiologist and device physician.   ICM trend: 03/18/2016       [name], RN 03/18/2016 1:25 PM

## 2016-03-25 ENCOUNTER — Ambulatory Visit (INDEPENDENT_AMBULATORY_CARE_PROVIDER_SITE_OTHER): Payer: BC Managed Care – PPO

## 2016-03-25 DIAGNOSIS — Z95810 Presence of automatic (implantable) cardiac defibrillator: Secondary | ICD-10-CM

## 2016-03-25 DIAGNOSIS — I5022 Chronic systolic (congestive) heart failure: Secondary | ICD-10-CM

## 2016-03-25 NOTE — Progress Notes (Signed)
EPIC Encounter for ICM Monitoring  Patient Name: Gary Brady is a 65 y.o. male Date: 03/25/2016 Primary Care Physican: Kaleen Mask, MD Primary Cardiologist:Crenshaw Electrophysiologist: Allred Dry Weight:    268 lb        Heart Failure questions reviewed, pt asymptomatic   Thoracic impedance returned to normal after taking extra Furosemide.   Recommendations: No changes.  Reinforced low salt food choices and limiting fluid intake to < 2 liters per day. Encouraged to call for fluid symptoms.    Follow-up plan: ICM clinic phone appointment on 04/25/2016.  Copy of ICM check sent to device physician.   ICM trend: 03/25/2016       [name], RN 03/25/2016 3:38 PM

## 2016-04-03 ENCOUNTER — Ambulatory Visit (INDEPENDENT_AMBULATORY_CARE_PROVIDER_SITE_OTHER): Payer: BC Managed Care – PPO | Admitting: Ophthalmology

## 2016-04-11 ENCOUNTER — Ambulatory Visit (INDEPENDENT_AMBULATORY_CARE_PROVIDER_SITE_OTHER): Payer: BC Managed Care – PPO | Admitting: Ophthalmology

## 2016-04-11 DIAGNOSIS — I10 Essential (primary) hypertension: Secondary | ICD-10-CM | POA: Diagnosis not present

## 2016-04-11 DIAGNOSIS — H35033 Hypertensive retinopathy, bilateral: Secondary | ICD-10-CM

## 2016-04-11 DIAGNOSIS — H43813 Vitreous degeneration, bilateral: Secondary | ICD-10-CM | POA: Diagnosis not present

## 2016-04-11 DIAGNOSIS — H33302 Unspecified retinal break, left eye: Secondary | ICD-10-CM

## 2016-04-11 DIAGNOSIS — H2513 Age-related nuclear cataract, bilateral: Secondary | ICD-10-CM | POA: Diagnosis not present

## 2016-04-16 ENCOUNTER — Telehealth: Payer: Self-pay | Admitting: *Deleted

## 2016-04-16 ENCOUNTER — Encounter: Payer: Self-pay | Admitting: Podiatry

## 2016-04-16 ENCOUNTER — Ambulatory Visit (INDEPENDENT_AMBULATORY_CARE_PROVIDER_SITE_OTHER): Payer: BC Managed Care – PPO | Admitting: Podiatry

## 2016-04-16 DIAGNOSIS — E119 Type 2 diabetes mellitus without complications: Secondary | ICD-10-CM

## 2016-04-16 DIAGNOSIS — B070 Plantar wart: Secondary | ICD-10-CM

## 2016-04-16 MED ORDER — NONFORMULARY OR COMPOUNDED ITEM
3 refills | Status: DC
Start: 2016-04-16 — End: 2017-12-05

## 2016-04-16 MED ORDER — NONFORMULARY OR COMPOUNDED ITEM: 2 refills | Status: DC

## 2016-04-16 NOTE — Progress Notes (Signed)
   Subjective:    Patient ID: Gary Brady, male    DOB: Jan 19, 1951, 65 y.o.   MRN: [id number]  HPI: He presents today with a chief complaint is some fifth metatarsal phalangeal joint fifth metatarsal base area on the side of the foot is painful as he refers to the right foot. He states his Multiple callused areas and has had them for several years he states they seem to be getting larger and is no treatment diabetes is under control he states last hemoglobin A1c was 6.8.    Review of Systems  All other systems reviewed and are negative.      Objective:   Physical Exam: Vital signs are stable he's alert and oriented 3. Pulses are palpable. Neurologic sensorium is intact. Deep tendon reflexes are intact. Muscle strength is normal bilateral. Orthopedic evaluation of systems unable for active motion without crepitation. Cutaneous evaluation doesn't straight dry xerotic skin possible tinea pedis to the right foot. Onychomycosis does appear to be prominent. Very large thick reactive warts to the lateral aspect of the foot several scattered throughout the plantar heel on the right foot as well as on the forefoot left.        Assessment & Plan:  Verruca plantaris with history of diabetes mellitus.  Plan: Efudex cream was prescribed today and a compounded material. Recommended daily debridement with a clean file. Follow-up with him in 6 weeks.

## 2016-04-25 ENCOUNTER — Ambulatory Visit (INDEPENDENT_AMBULATORY_CARE_PROVIDER_SITE_OTHER): Payer: BC Managed Care – PPO

## 2016-04-25 DIAGNOSIS — Z95810 Presence of automatic (implantable) cardiac defibrillator: Secondary | ICD-10-CM

## 2016-04-25 DIAGNOSIS — I5022 Chronic systolic (congestive) heart failure: Secondary | ICD-10-CM | POA: Diagnosis not present

## 2016-04-25 NOTE — Progress Notes (Signed)
EPIC Encounter for ICM Monitoring  Patient Name: Gary Brady is a 65 y.o. male Date: 04/25/2016 Primary Care Physican: Kaleen Mask, MD Primary Cardiologist:Crenshaw Electrophysiologist: Allred Dry Weight:270lb                 Heart Failure questions reviewed, pt symptomatic with 2 lb weight gain and chest congestion.  He reported 2 days ago he felt so lightheaded that he thought he would pass out.  He stated it went away and not had further episodes.  Advised if happens again to call and to check BP at home.    Thoracic impedance abnormal suggesting fluid accumulation.  Recommendations:   Recommended to take Furosemide 40 mg 0.5 tablet (20 mg total) x 3 days with Potassium 20 meq x 3 days and return to Furosemide and Potassium prn as prescribed.      Follow-up plan: ICM clinic phone appointment on 05/02/2016 to recheck fluid levels.  Copy of ICM check sent to primary cardiologist and device physician.   ICM trend: 04/25/2016       [name], RN 04/25/2016 2:46 PM   ]

## 2016-05-02 ENCOUNTER — Ambulatory Visit (INDEPENDENT_AMBULATORY_CARE_PROVIDER_SITE_OTHER): Payer: BC Managed Care – PPO

## 2016-05-02 DIAGNOSIS — Z95810 Presence of automatic (implantable) cardiac defibrillator: Secondary | ICD-10-CM

## 2016-05-02 DIAGNOSIS — I5022 Chronic systolic (congestive) heart failure: Secondary | ICD-10-CM

## 2016-05-02 NOTE — Progress Notes (Signed)
EPIC Encounter for ICM Monitoring  Patient Name: Gary Brady is a 65 y.o. male Date: 05/02/2016 Primary Care Physican: Kaleen Mask, MD Primary Cardiologist:Crenshaw Electrophysiologist: Allred Dry Weight:unknown                                                         Spoke with wife. Heart Failure questions reviewed, pt asymptomatic   Thoracic impedance returned to normal after taking Furosemide 40 mg 0.5 tablet (20 mg total) x 3 days with Potassium 20 meq x 3 days   Recommendations: No changes.  Reinforced to limit low salt food choices to 2000 mg day and limiting fluid intake to < 2 liters per day. Encouraged to call for fluid symptoms.    Follow-up plan: ICM clinic phone appointment on 05/27/2016.  Copy of ICM check sent to device physician.   3 month ICM trend : 05/02/2016   1 Year ICM trend:     [name], RN 05/02/2016 10:48 AM

## 2016-05-27 ENCOUNTER — Telehealth: Payer: Self-pay | Admitting: Cardiology

## 2016-05-27 ENCOUNTER — Ambulatory Visit (INDEPENDENT_AMBULATORY_CARE_PROVIDER_SITE_OTHER): Payer: BC Managed Care – PPO | Admitting: *Deleted

## 2016-05-27 DIAGNOSIS — Z95810 Presence of automatic (implantable) cardiac defibrillator: Secondary | ICD-10-CM

## 2016-05-27 DIAGNOSIS — I5022 Chronic systolic (congestive) heart failure: Secondary | ICD-10-CM

## 2016-05-27 DIAGNOSIS — I428 Other cardiomyopathies: Secondary | ICD-10-CM

## 2016-05-27 NOTE — Progress Notes (Signed)
EPIC Encounter for ICM Monitoring  Patient Name: Gary Brady is a 66 y.o. male Date: 05/27/2016 Primary Care Physican: Kaleen Mask, MD Primary Cardiologist:Crenshaw Electrophysiologist: Allred Dry Weight:unknown       Spoke with wife, (DPR).  Heart Failure questions reviewed, pt asymptomatic   Thoracic impedance normal   Recommendations: No changes. Encouraged to call for fluid symptoms.  Follow-up plan: ICM clinic phone appointment on 06/27/2016.  Copy of ICM check sent to device physician.   3 month ICM trend: 05/27/2016   1 Year ICM trend:      [name], RN 05/27/2016 4:00 PM

## 2016-05-27 NOTE — Telephone Encounter (Signed)
Spoke with pt and reminded pt of remote transmission that is due today. Pt verbalized understanding.   

## 2016-05-28 ENCOUNTER — Encounter: Payer: Self-pay | Admitting: Podiatry

## 2016-05-28 ENCOUNTER — Ambulatory Visit (INDEPENDENT_AMBULATORY_CARE_PROVIDER_SITE_OTHER): Payer: BC Managed Care – PPO | Admitting: Podiatry

## 2016-05-28 DIAGNOSIS — E119 Type 2 diabetes mellitus without complications: Secondary | ICD-10-CM

## 2016-05-28 DIAGNOSIS — B070 Plantar wart: Secondary | ICD-10-CM

## 2016-05-28 NOTE — Progress Notes (Signed)
He presents today for follow-up of his verruca plantaris to the plantar aspect of bilateral foot over the lateral aspect of the fifth metatarsal seems to be doing much better he states he states they all seem to be improving.  Objective: Verruca plantaris bilateral foot appears to be slowly resolving.  Assessment well-healing verruca plantaris.  Plan: I encouraged him to continue Efudex treatment I did apply Secure under occlusion today today early tomorrow to the most prominent area fifth right.  Remember asking how his trip went.

## 2016-05-29 ENCOUNTER — Encounter: Payer: Self-pay | Admitting: Cardiology

## 2016-05-29 LAB — CUP PACEART REMOTE DEVICE CHECK
Battery Remaining Longevity: 108 mo
Battery Voltage: 3.01 V
Brady Statistic RV Percent Paced: 0 %
HighPow Impedance: 88 Ohm
Implantable Lead Model: 6935
Implantable Pulse Generator Implant Date: 20141202
Lead Channel Impedance Value: 437 Ohm
Lead Channel Impedance Value: 513 Ohm
Lead Channel Setting Pacing Amplitude: 2 V
Lead Channel Setting Pacing Pulse Width: 0.4 ms
Lead Channel Setting Sensing Sensitivity: 0.3 mV
MDC IDC LEAD IMPLANT DT: 20141202
MDC IDC LEAD LOCATION: 753860
MDC IDC MSMT LEADCHNL RV PACING THRESHOLD AMPLITUDE: 0.5 V
MDC IDC MSMT LEADCHNL RV PACING THRESHOLD PULSEWIDTH: 0.4 ms
MDC IDC MSMT LEADCHNL RV SENSING INTR AMPL: 7.75 mV
MDC IDC MSMT LEADCHNL RV SENSING INTR AMPL: 7.75 mV
MDC IDC SESS DTM: 20180122204835

## 2016-05-29 NOTE — Progress Notes (Signed)
Remote ICD transmission.   

## 2016-06-27 ENCOUNTER — Ambulatory Visit (INDEPENDENT_AMBULATORY_CARE_PROVIDER_SITE_OTHER): Payer: BC Managed Care – PPO

## 2016-06-27 DIAGNOSIS — I5022 Chronic systolic (congestive) heart failure: Secondary | ICD-10-CM

## 2016-06-27 DIAGNOSIS — Z95810 Presence of automatic (implantable) cardiac defibrillator: Secondary | ICD-10-CM | POA: Diagnosis not present

## 2016-06-28 NOTE — Progress Notes (Signed)
EPIC Encounter for ICM Monitoring  Patient Name: Gary Brady is a 65 y.o. male Date: 06/28/2016 Primary Care Physican: Kaleen Mask, MD Primary Cardiologist:Crenshaw Electrophysiologist: Allred Dry Weight:272 lbs      Heart Failure questions reviewed, pt asymptomatic.   Thoracic impedance normal but was abnormal suggesting fluid accumulation from 06/06/2016 to 06/19/2016.  Patient stated he was on a cruise during time impedance was abnormal.  Prescribed dosage Furosemide 0.5 tablets (20 mg total) by mouth daily as needed (fluid) and Potassium 1 tablet by mouth as directed only take on days that Lasix is taken.  Recommendations: No changes. Reminded to limit dietary salt intake to 2000 mg/day and fluid intake to < 2 liters/day. Encouraged to call for fluid symptoms.  Follow-up plan: ICM clinic phone appointment on 07/29/2016.  Defib office check with Dr Johney Frame 08/12/2016  Copy of ICM check sent to device physician.   3 month ICM trend: 06/28/2016   1 Year ICM trend:      [name], RN 06/28/2016 8:53 AM

## 2016-07-09 ENCOUNTER — Ambulatory Visit: Payer: BC Managed Care – PPO | Admitting: Podiatry

## 2016-07-29 ENCOUNTER — Ambulatory Visit (INDEPENDENT_AMBULATORY_CARE_PROVIDER_SITE_OTHER): Payer: BC Managed Care – PPO

## 2016-07-29 DIAGNOSIS — Z95810 Presence of automatic (implantable) cardiac defibrillator: Secondary | ICD-10-CM | POA: Diagnosis not present

## 2016-07-29 DIAGNOSIS — I5022 Chronic systolic (congestive) heart failure: Secondary | ICD-10-CM | POA: Diagnosis not present

## 2016-07-30 NOTE — Progress Notes (Signed)
EPIC Encounter for ICM Monitoring  Patient Name: Gary Brady is a 66 y.o. male Date: 07/30/2016 Primary Care Physican: Kaleen Mask, MD Primary Cardiologist:Crenshaw Electrophysiologist: Allred Dry Weight:265 lbs         Heart Failure questions reviewed, pt asymptomatic for fluid symptoms.  He has a respiratory infection and physician told him to let the infection run its course.    Thoracic impedance normal.  Prescribed dosage Furosemide 0.5 tablets (20 mg total) by mouth daily as needed (fluid) and Potassium 1 tablet by mouth as directed only take on days that Lasix is taken.  Recommendations: No changes. Reminded to limit dietary salt intake to 2000 mg/day and fluid intake to < 2 liters/day. Encouraged to call for fluid symptoms.  Follow-up plan: ICM clinic phone appointment on 09/12/2016.  Defib office check with Dr Johney Frame 08/12/2016  Copy of ICM check sent to device physician.   3 month ICM trend: 07/29/2016   1 Year ICM trend:      [name], RN 07/30/2016 8:07 AM

## 2016-08-12 ENCOUNTER — Other Ambulatory Visit: Payer: Self-pay

## 2016-08-12 ENCOUNTER — Ambulatory Visit (INDEPENDENT_AMBULATORY_CARE_PROVIDER_SITE_OTHER): Payer: BC Managed Care – PPO | Admitting: Internal Medicine

## 2016-08-12 ENCOUNTER — Encounter: Payer: Self-pay | Admitting: Internal Medicine

## 2016-08-12 VITALS — BP 126/80 | HR 84 | Ht 67.0 in | Wt 272.4 lb

## 2016-08-12 DIAGNOSIS — I5022 Chronic systolic (congestive) heart failure: Secondary | ICD-10-CM

## 2016-08-12 DIAGNOSIS — I10 Essential (primary) hypertension: Secondary | ICD-10-CM

## 2016-08-12 DIAGNOSIS — I519 Heart disease, unspecified: Secondary | ICD-10-CM

## 2016-08-12 DIAGNOSIS — I428 Other cardiomyopathies: Secondary | ICD-10-CM

## 2016-08-12 DIAGNOSIS — R0602 Shortness of breath: Secondary | ICD-10-CM | POA: Diagnosis not present

## 2016-08-12 LAB — CUP PACEART INCLINIC DEVICE CHECK
Battery Remaining Longevity: 105 mo
Battery Voltage: 3 V
Brady Statistic RV Percent Paced: 0.01 %
HighPow Impedance: 79 Ohm
Implantable Lead Implant Date: 20141202
Implantable Lead Location: 753860
Implantable Lead Model: 6935
Implantable Pulse Generator Implant Date: 20141202
Lead Channel Impedance Value: 532 Ohm
Lead Channel Pacing Threshold Pulse Width: 0.4 ms
Lead Channel Setting Pacing Pulse Width: 0.4 ms
Lead Channel Setting Sensing Sensitivity: 0.3 mV
MDC IDC MSMT LEADCHNL RV IMPEDANCE VALUE: 456 Ohm
MDC IDC MSMT LEADCHNL RV PACING THRESHOLD AMPLITUDE: 0.75 V
MDC IDC MSMT LEADCHNL RV SENSING INTR AMPL: 8 mV
MDC IDC SESS DTM: 20180409153122
MDC IDC SET LEADCHNL RV PACING AMPLITUDE: 2 V

## 2016-08-12 NOTE — Patient Instructions (Signed)
Medication Instructions:  Your physician recommends that you continue on your current medications as directed. Please refer to the Current Medication list given to you today.   Labwork:  Your physician recommends that you return for lab work today: BMP/BNP   Testing/Procedures: None ordered   Follow-Up: Your physician wants you to follow-up in: 6 months with Dr Jens Som and 12 months with Gypsy Balsam, NP You will receive a reminder letter in the mail two months in advance. If you don't receive a letter, please call our office to schedule the follow-up appointment  Remote monitoring is used to monitor your  ICD from home. This monitoring reduces the number of office visits required to check your device to one time per year. It allows [place] to keep an eye on the functioning of your device to ensure it is working properly. You are scheduled for a device check from home on 11/11/16. You may send your transmission at any time that day. If you have a wireless device, the transmission will be sent automatically. After your physician reviews your transmission, you will receive a postcard with your next transmission date.     Any Other Special Instructions Will Be Listed Below (If Applicable).     If you need a refill on your cardiac medications before your next appointment, please call your pharmacy.
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
## 2016-08-13 LAB — BASIC METABOLIC PANEL
BUN/Creatinine Ratio: 13 (ref 10–24)
BUN: 17 mg/dL (ref 8–27)
CALCIUM: 9.4 mg/dL (ref 8.6–10.2)
CHLORIDE: 98 mmol/L (ref 96–106)
CO2: 23 mmol/L (ref 18–29)
Creatinine, Ser: 1.27 mg/dL (ref 0.76–1.27)
GFR calc Af Amer: 69 mL/min/{1.73_m2} (ref >59)
GFR calc non Af Amer: 60 mL/min/{1.73_m2} (ref >59)
Glucose: 179 mg/dL — ABNORMAL HIGH (ref 65–99)
Potassium: 4.4 mmol/L (ref 3.5–5.2)
Sodium: 138 mmol/L (ref 134–144)

## 2016-08-13 LAB — PRO B NATRIURETIC PEPTIDE: NT-Pro BNP: 195 pg/mL (ref 0–210)

## 2016-09-12 ENCOUNTER — Telehealth: Payer: Self-pay | Admitting: Cardiology

## 2016-09-12 ENCOUNTER — Other Ambulatory Visit: Payer: Self-pay | Admitting: Cardiology

## 2016-09-12 ENCOUNTER — Ambulatory Visit (INDEPENDENT_AMBULATORY_CARE_PROVIDER_SITE_OTHER): Payer: BC Managed Care – PPO

## 2016-09-12 DIAGNOSIS — I519 Heart disease, unspecified: Secondary | ICD-10-CM | POA: Diagnosis not present

## 2016-09-12 DIAGNOSIS — Z95810 Presence of automatic (implantable) cardiac defibrillator: Secondary | ICD-10-CM | POA: Diagnosis not present

## 2016-09-12 NOTE — Telephone Encounter (Signed)
Confirmed remote transmission w/ pt wife.   

## 2016-09-12 NOTE — Progress Notes (Signed)
EPIC Encounter for ICM Monitoring  Patient Name: Gary Brady is a 66 y.o. male Date: 09/12/2016 Primary Care Physican: Leonard Downing, MD Primary Cardiologist:Crenshaw Electrophysiologist: Allred Dry Weight:last ICM [id number] lbs      Heart Failure questions reviewed, pt symptomatic with hand swelling.   Thoracic impedance abnormal suggesting fluid accumulation. He does not pay attention to how much salt is in the foods he is eating.  Prescribed dosage Furosemide 0.5 tablets (20 mg total) by mouth daily as needed (fluid) and Potassium 1 tablet by mouth as directed only take on days that Lasix is taken.  Labs: 08/12/2016 Creatinine 1.27, BUN 17, Potassium 4.4, Sodium 138, EGFR 60-69  Recommendations: Advised to take PRN Furosemide x 2-3 days along with Potassium as prescribed for the days he takes Lasix.  Advised to review food labels and limit salt.   Follow-up plan: ICM clinic phone appointment on 09/17/2016 to recheck fluid levels.    Copy of ICM check sent to primary cardiologist and device physician.   3 month ICM trend: 09/12/2008   1 Year ICM trend:      Rosalene Billings, RN 09/12/2016 12:39 PM

## 2016-09-17 ENCOUNTER — Ambulatory Visit (INDEPENDENT_AMBULATORY_CARE_PROVIDER_SITE_OTHER): Payer: Self-pay

## 2016-09-17 ENCOUNTER — Telehealth: Payer: Self-pay

## 2016-09-17 DIAGNOSIS — I519 Heart disease, unspecified: Secondary | ICD-10-CM

## 2016-09-17 DIAGNOSIS — Z95810 Presence of automatic (implantable) cardiac defibrillator: Secondary | ICD-10-CM

## 2016-09-17 NOTE — Progress Notes (Signed)
EPIC Encounter for ICM Monitoring  Patient Name: Gary Brady is a 66 y.o. male Date: 09/17/2016 Primary Care Physican: Leonard Downing, MD Primary Cardiologist:Crenshaw Electrophysiologist: Allred Dry Weight:last ICM weight265lbs      Attempted call to patient and unable to reach.  Left message to return call regarding transmission.  Transmission reviewed. .   Thoracic impedance improved after taking prn Furosemide x 3 days but still decreased suggesting fluid accumulation.  Prescribed dosage Furosemide 0.5 tablets (20 mg total) by mouth daily as needed (fluid) and Potassium 1 tablet by mouth as directed only take on days that Lasix is taken.  Labs: 08/12/2016 Creatinine 1.27, BUN 17, Potassium 4.4, Sodium 138, EGFR 60-69  Recommendations: NONE - Unable to reach patient   Follow-up plan: ICM clinic phone appointment on 10/01/2016 to recheck fluid levels and 10/14/2016.    Copy of ICM check sent to primary cardiologist and device physician.   3 month ICM trend: 09/17/2016   1 Year ICM trend:      Rosalene Billings, RN 09/17/2016 7:58 AM

## 2016-09-17 NOTE — Telephone Encounter (Signed)
Remote ICM transmission received.  Attempted patient call and left message to return call regarding transmission.    

## 2016-10-01 ENCOUNTER — Ambulatory Visit (INDEPENDENT_AMBULATORY_CARE_PROVIDER_SITE_OTHER): Payer: BC Managed Care – PPO

## 2016-10-01 DIAGNOSIS — I519 Heart disease, unspecified: Secondary | ICD-10-CM

## 2016-10-01 DIAGNOSIS — Z95810 Presence of automatic (implantable) cardiac defibrillator: Secondary | ICD-10-CM

## 2016-10-01 NOTE — Progress Notes (Signed)
EPIC Encounter for ICM Monitoring  Patient Name: Gary Brady is a 66 y.o. male Date: 10/01/2016 Primary Care Physican: Leonard Downing, MD Primary Cardiologist:Crenshaw Electrophysiologist: Allred Dry Weight: 563JSH      Heart Failure questions reviewed, pt asymptomatic   Thoracic impedance normal.  Prescribed dosage Furosemide 0.5 tablets (20 mg total) by mouth daily as needed (fluid) and Potassium 1 tablet by mouth as directed only take on days that Lasix is taken.  Labs: 08/12/2016 Creatinine 1.27, BUN 17, Potassium 4.4, Sodium 138, EGFR 60-69  Recommendations: No changes. Discussed to limit salt intake to 2000 mg/day and fluid intake to < 2 liters/day.  Encouraged to call for fluid symptoms or use local ER for any urgent symptoms.  Follow-up plan: ICM clinic phone appointment on 10/14/2016.   Patient is overdue to make an appt with Dr Stanford Breed, last visit was 08/10/2015.  Copy of ICM check sent to device physician.   3 month ICM trend: 10/01/2016   1 Year ICM trend:      Rosalene Billings, RN 10/01/2016 11:45 AM

## 2016-10-14 ENCOUNTER — Ambulatory Visit (INDEPENDENT_AMBULATORY_CARE_PROVIDER_SITE_OTHER): Payer: BC Managed Care – PPO

## 2016-10-14 DIAGNOSIS — Z95810 Presence of automatic (implantable) cardiac defibrillator: Secondary | ICD-10-CM

## 2016-10-14 DIAGNOSIS — I5022 Chronic systolic (congestive) heart failure: Secondary | ICD-10-CM

## 2016-10-14 NOTE — Progress Notes (Signed)
EPIC Encounter for ICM Monitoring  Patient Name: Gary Brady is a 66 y.o. male Date: 10/14/2016 Primary Care Physican: Leonard Downing, MD Primary Cardiologist:Crenshaw Electrophysiologist: Allred Dry Weight: last known weight was267lbs             Spoke with wife.  Heart Failure questions reviewed, pt asymptomatic    Thoracic impedance at normal today but has been abnormal suggesting fluid accumulation from 10/07/2016 to today.  Prescribed dosage Furosemide 0.5 tablets (20 mg total) by mouth daily as needed (fluid) and Potassium 1 tablet by mouth as directed only take on days that Lasix is taken.  Labs: 08/12/2016 Creatinine 1.27, BUN 17, Potassium 4.4, Sodium 138, EGFR 60-69  Recommendations: Advised to limit salt intake to 2000 mg/day and fluid intake to < 2 liters/day and he can take PRN Furosemide for any fluid symptoms.  Encouraged to call for fluid symptoms.  Follow-up plan: ICM clinic phone appointment on 11/14/2016.    Copy of ICM check sent to primary cardiologist and device physician.   3 month ICM trend: 10/14/2016   1 Year ICM trend:      Rosalene Billings, RN 10/14/2016 10:44 AM

## 2016-11-14 ENCOUNTER — Ambulatory Visit: Payer: BC Managed Care – PPO | Admitting: *Deleted

## 2016-11-14 DIAGNOSIS — Z95810 Presence of automatic (implantable) cardiac defibrillator: Secondary | ICD-10-CM

## 2016-11-14 DIAGNOSIS — I428 Other cardiomyopathies: Secondary | ICD-10-CM

## 2016-11-14 DIAGNOSIS — I5022 Chronic systolic (congestive) heart failure: Secondary | ICD-10-CM

## 2016-11-14 NOTE — Progress Notes (Signed)
EPIC Encounter for ICM Monitoring  Patient Name: Gary Brady is a 66 y.o. male Date: 11/14/2016 Primary Care Physican: Elkins, Wilson Oliver, MD Primary Cardiologist:Crenshaw Electrophysiologist: Allred Dry Weight: last known weight was267lbs                                       Spoke with wife.  Heart Failure questions reviewed, pt asymptomatic    Thoracic impedance is normal but has been abnormal suggesting fluid accumulation from 10/24/2016 to 11/11/2016. Wife stated they were on a cruise during that time and not on a low salt diet.  Prescribed dosage Furosemide 0.5 tablets (20 mg total) by mouth daily as needed (fluid) and Potassium 1 tablet by mouth as directed only take on days that Lasix is taken.  Labs: 08/12/2016 Creatinine 1.27, BUN 17, Potassium 4.4, Sodium 138, EGFR 60-69  Recommendations: No changes.   Encouraged to call for fluid symptoms.  Follow-up plan: ICM clinic phone appointment on 12/17/2016.    Copy of ICM check sent to device physician.   3 month ICM trend: 11/14/2016   1 Year ICM trend:       S , RN 11/14/2016 3:14 PM    

## 2016-11-19 ENCOUNTER — Encounter: Payer: Self-pay | Admitting: Cardiology

## 2016-12-11 LAB — CUP PACEART REMOTE DEVICE CHECK
Battery Remaining Longevity: 97 mo
HighPow Impedance: 72 Ohm
Implantable Lead Implant Date: 20141202
Implantable Pulse Generator Implant Date: 20141202
Lead Channel Impedance Value: 380 Ohm
Lead Channel Pacing Threshold Amplitude: 0.5 V
Lead Channel Pacing Threshold Pulse Width: 0.4 ms
Lead Channel Setting Pacing Pulse Width: 0.4 ms
Lead Channel Setting Sensing Sensitivity: 0.3 mV
MDC IDC LEAD LOCATION: 753860
MDC IDC MSMT BATTERY VOLTAGE: 3 V
MDC IDC MSMT LEADCHNL RV IMPEDANCE VALUE: 456 Ohm
MDC IDC MSMT LEADCHNL RV SENSING INTR AMPL: 7.75 mV
MDC IDC MSMT LEADCHNL RV SENSING INTR AMPL: 7.75 mV
MDC IDC SESS DTM: 20180712073627
MDC IDC SET LEADCHNL RV PACING AMPLITUDE: 2 V
MDC IDC STAT BRADY RV PERCENT PACED: 0 %

## 2016-12-17 ENCOUNTER — Other Ambulatory Visit: Payer: Self-pay

## 2016-12-17 ENCOUNTER — Ambulatory Visit (INDEPENDENT_AMBULATORY_CARE_PROVIDER_SITE_OTHER): Payer: BC Managed Care – PPO

## 2016-12-17 DIAGNOSIS — Z95810 Presence of automatic (implantable) cardiac defibrillator: Secondary | ICD-10-CM

## 2016-12-17 DIAGNOSIS — I5022 Chronic systolic (congestive) heart failure: Secondary | ICD-10-CM | POA: Diagnosis not present

## 2016-12-17 MED ORDER — FUROSEMIDE 40 MG PO TABS: 20.0000 mg | ORAL_TABLET | Freq: Every day | ORAL | 0 refills | Status: DC | PRN

## 2016-12-17 NOTE — Progress Notes (Signed)
EPIC Encounter for ICM Monitoring  Patient Name: Gary Brady is a 66 y.o. male Date: 12/17/2016 Primary Care Physican: Leonard Downing, MD Primary Cardiologist:Crenshaw Electrophysiologist: Allred Dry Weight:  271lbs      Heart Failure questions reviewed, pt's weight increased by 3 lbs.    Thoracic impedance abnormal suggesting fluid accumulation since 12/08/2016.  Prescribed dosage: Furosemide 40 mg 0.5 tablet (20 mg total) by mouth daily as needed (fluid) and Potassium 1 tablet by mouth as directed only take on days that Lasix is taken.  Labs: 08/12/2016 Creatinine 1.27, BUN 17, Potassium 4.4, Sodium 138, EGFR 60-69  Recommendations: Advised to take Furosemide 20 mg x 3 days with Potassium 1 tablet x 3 days and then return to PRN for Furosemide and Potassium as prescribed.   Patient requested refill for Furosemide and message sent to [facility] refill department.   Follow-up plan: ICM clinic phone appointment on 12/23/2016 to recheck fluid levels.    Copy of ICM check sent to Dr Stanford Breed and Dr Rayann Heman.   3 month ICM trend: 12/17/2016   1 Year ICM trend:      Rosalene Billings, RN 12/17/2016 11:26 AM

## 2016-12-17 NOTE — Telephone Encounter (Signed)
Patient requested refill of Furosemide 40 mg tablets.  Confirmed he is taking 0.5 tablet (20mg  total) daily as needed.  He request [facility] [address] in [place]. Advised would sent to refill department.

## 2016-12-23 ENCOUNTER — Ambulatory Visit (INDEPENDENT_AMBULATORY_CARE_PROVIDER_SITE_OTHER): Payer: Self-pay

## 2016-12-23 ENCOUNTER — Telehealth: Payer: Self-pay

## 2016-12-23 DIAGNOSIS — I5022 Chronic systolic (congestive) heart failure: Secondary | ICD-10-CM

## 2016-12-23 DIAGNOSIS — Z95810 Presence of automatic (implantable) cardiac defibrillator: Secondary | ICD-10-CM

## 2016-12-23 NOTE — Telephone Encounter (Signed)
Remote ICM transmission received.  Attempted patient call and left detailed message regarding transmission and next ICM scheduled for 01/17/2017.  Advised to return call for any fluid symptoms or questions.

## 2016-12-23 NOTE — Progress Notes (Signed)
EPIC Encounter for ICM Monitoring  Patient Name: Gary Brady is a 66 y.o. male Date: 12/23/2016 Primary Care Physican: Leonard Downing, MD Primary Cardiologist:Crenshaw Electrophysiologist: Allred Dry Weight: Previous ICM weight 271lbs        Attempted call to patient and unable to reach.  Left detailed message regarding transmission.  Transmission reviewed.    Thoracic impedance returned to normal after taking 3 days of PRN Furosemide.   Prescribed dosage: Furosemide 40 mg 0.5 tablet (20 mg total) by mouth daily as needed (fluid) and Potassium 1 tablet by mouth as directed only take on days that Lasix is taken.  Labs: 08/12/2016 Creatinine 1.27, BUN 17, Potassium 4.4, Sodium 138, EGFR 60-69  Recommendations: Left voice mail with ICM number and encouraged to call for fluid symptoms.  Follow-up plan: ICM clinic phone appointment on 01/17/2017.  Office appointment scheduled 03/05/2017 with Dr. Stanford Breed.  Copy of ICM check sent to device physician.   3 month ICM trend: 12/23/2016   1 Year ICM trend:      Rosalene Billings, RN 12/23/2016 2:32 PM

## 2017-01-17 ENCOUNTER — Ambulatory Visit (INDEPENDENT_AMBULATORY_CARE_PROVIDER_SITE_OTHER): Payer: BC Managed Care – PPO

## 2017-01-17 DIAGNOSIS — Z95810 Presence of automatic (implantable) cardiac defibrillator: Secondary | ICD-10-CM | POA: Diagnosis not present

## 2017-01-17 DIAGNOSIS — I5022 Chronic systolic (congestive) heart failure: Secondary | ICD-10-CM | POA: Diagnosis not present

## 2017-01-17 NOTE — Progress Notes (Signed)
EPIC Encounter for ICM Monitoring  Patient Name: Gary Brady is a 66 y.o. male Date: 01/17/2017 Primary Care Physican: Leonard Downing, MD Primary Cardiologist:Crenshaw Electrophysiologist: Allred Dry Weight:  270lbs       Heart Failure questions reviewed, pt asymptomatic.   Thoracic impedance normal.  Prescribed dosage: Furosemide 40 mg 0.5 tablet (20 mg total) by mouth daily as needed (fluid) and Potassium 1 tablet by mouth as directed only take on days that Lasix is taken.  Labs: 08/12/2016 Creatinine 1.27, BUN 17, Potassium 4.4, Sodium 138, EGFR 60-69  Recommendations: No changes.    Encouraged to call for fluid symptoms.  Follow-up plan: ICM clinic phone appointment on 02/17/2017.   Copy of ICM check sent to Dr. Rayann Heman.   3 month ICM trend: 01/17/2017   1 Year ICM trend:      Rosalene Billings, RN 01/17/2017 8:36 AM

## 2017-02-17 ENCOUNTER — Telehealth: Payer: Self-pay | Admitting: Cardiology

## 2017-02-17 ENCOUNTER — Ambulatory Visit (INDEPENDENT_AMBULATORY_CARE_PROVIDER_SITE_OTHER): Payer: BC Managed Care – PPO | Admitting: *Deleted

## 2017-02-17 DIAGNOSIS — I428 Other cardiomyopathies: Secondary | ICD-10-CM | POA: Diagnosis not present

## 2017-02-17 NOTE — Telephone Encounter (Signed)
Entered in error

## 2017-02-17 NOTE — Telephone Encounter (Signed)
Spoke with pt and reminded pt of remote transmission that is due today. Pt verbalized understanding.   

## 2017-02-19 ENCOUNTER — Encounter: Payer: Self-pay | Admitting: Cardiology

## 2017-02-20 ENCOUNTER — Ambulatory Visit (INDEPENDENT_AMBULATORY_CARE_PROVIDER_SITE_OTHER): Payer: BC Managed Care – PPO

## 2017-02-20 ENCOUNTER — Telehealth: Payer: Self-pay

## 2017-02-20 DIAGNOSIS — Z95810 Presence of automatic (implantable) cardiac defibrillator: Secondary | ICD-10-CM

## 2017-02-20 DIAGNOSIS — I5022 Chronic systolic (congestive) heart failure: Secondary | ICD-10-CM

## 2017-02-20 NOTE — Progress Notes (Signed)
EPIC Encounter for ICM Monitoring  Patient Name: Gary Brady is a 66 y.o. male Date: 02/20/2017 Primary Care Physican: Kaleen Mask, MD Primary Cardiologist:Crenshaw Electrophysiologist: Allred Dry Weight:  Last weight 270lbs        Attempted call to patient and unable to reach.  Left detailed message regarding transmission.  Transmission reviewed.    Thoracic impedance normal but has been abnormal since end of October suggesting fluid accumulation.  Back at baseline day of transmission.  Prescribed dosage: Furosemide 40 mg 0.5 tablet (20 mg total) by mouth daily as needed (fluid) and Potassium 1 tablet by mouth as directed only take on days that Lasix is taken.  Recommendations: Left voice mail with ICM number and encouraged to call if experiencing any fluid symptoms.  Follow-up plan: ICM clinic phone appointment on 03/24/2017.  Office appointment scheduled 03/05/2017 with Dr. Jens Som.  Copy of ICM check sent to Dr. Johney Frame.   3 month ICM trend: 02/19/2017   1 Year ICM trend:      [name], RN 02/20/2017 10:11 AM

## 2017-02-20 NOTE — Telephone Encounter (Signed)
Remote ICM transmission received.  Attempted call to patient and left detailed message per DPR regarding transmission and next ICM scheduled for 03/24/2017.  Advised to return call for any fluid symptoms or questions.    

## 2017-02-24 NOTE — Progress Notes (Signed)
Remote ICD transmission.   
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
## 2017-02-26 LAB — CUP PACEART REMOTE DEVICE CHECK
Battery Remaining Longevity: 98 mo
Battery Voltage: 3 V
Date Time Interrogation Session: 20181017022633
HighPow Impedance: 88 Ohm
Implantable Lead Implant Date: 20141202
Implantable Lead Location: 753860
Implantable Lead Model: 6935
Lead Channel Impedance Value: 494 Ohm
Lead Channel Impedance Value: 532 Ohm
Lead Channel Pacing Threshold Amplitude: 0.5 V
Lead Channel Sensing Intrinsic Amplitude: 8.875 mV
Lead Channel Setting Pacing Amplitude: 2 V
Lead Channel Setting Pacing Pulse Width: 0.4 ms
Lead Channel Setting Sensing Sensitivity: 0.3 mV
MDC IDC MSMT LEADCHNL RV PACING THRESHOLD PULSEWIDTH: 0.4 ms
MDC IDC MSMT LEADCHNL RV SENSING INTR AMPL: 8.875 mV
MDC IDC PG IMPLANT DT: 20141202
MDC IDC STAT BRADY RV PERCENT PACED: 0 %

## 2017-02-28 ENCOUNTER — Encounter: Payer: Self-pay | Admitting: Cardiology

## 2017-03-05 ENCOUNTER — Encounter: Payer: Self-pay | Admitting: Cardiology

## 2017-03-05 ENCOUNTER — Ambulatory Visit (INDEPENDENT_AMBULATORY_CARE_PROVIDER_SITE_OTHER): Payer: BC Managed Care – PPO | Admitting: Cardiology

## 2017-03-05 VITALS — BP 120/80 | HR 78 | Ht 67.0 in | Wt 277.0 lb

## 2017-03-05 DIAGNOSIS — I10 Essential (primary) hypertension: Secondary | ICD-10-CM | POA: Diagnosis not present

## 2017-03-05 DIAGNOSIS — I428 Other cardiomyopathies: Secondary | ICD-10-CM | POA: Diagnosis not present

## 2017-03-05 DIAGNOSIS — I2510 Atherosclerotic heart disease of native coronary artery without angina pectoris: Secondary | ICD-10-CM | POA: Diagnosis not present

## 2017-03-05 DIAGNOSIS — I5022 Chronic systolic (congestive) heart failure: Secondary | ICD-10-CM

## 2017-03-05 NOTE — Patient Instructions (Signed)
Medication Instructions:   ENTRESTO  Labwork:  Your physician recommends that you RETURN FOR LAB WORK PRIOR TO EATING  Follow-Up:  Your physician wants you to follow-up in: ONE YEAR WITH DR Shelda Pal will receive a reminder letter in the mail two months in advance. If you don't receive a letter, please call our office to schedule the follow-up appointment.   If you need a refill on your cardiac medications before your next appointment, please call your pharmacy.

## 2017-03-24 ENCOUNTER — Ambulatory Visit (INDEPENDENT_AMBULATORY_CARE_PROVIDER_SITE_OTHER): Payer: BC Managed Care – PPO

## 2017-03-24 DIAGNOSIS — Z95810 Presence of automatic (implantable) cardiac defibrillator: Secondary | ICD-10-CM | POA: Diagnosis not present

## 2017-03-24 DIAGNOSIS — I5022 Chronic systolic (congestive) heart failure: Secondary | ICD-10-CM

## 2017-03-24 NOTE — Progress Notes (Signed)
EPIC Encounter for ICM Monitoring  Patient Name: Gary Brady is a 66 y.o. male Date: 03/24/2017 Primary Care Physican: Kaleen Mask, MD Primary Cardiologist:Crenshaw Electrophysiologist: Allred Dry Weight:  185BMZ      Heart Failure questions reviewed, pt asymptomatic.   Thoracic impedance normal.  Prescribed dosage: Furosemide 40 mg 0.5 tablet (20 mg total) by mouth daily as needed (fluid). Klor Con 1 tablet by mouth as directed only take on days that Lasix is taken.  Recommendations: No changes.   Encouraged to call for fluid symptoms.  Follow-up plan: ICM clinic phone appointment on 04/24/2017.    Copy of ICM check sent to Dr. Johney Frame.   3 month ICM trend: 03/24/2017    1 Year ICM trend:       [name], RN 03/24/2017 11:16 AM

## 2017-04-03 ENCOUNTER — Encounter: Payer: Self-pay | Admitting: *Deleted

## 2017-04-24 ENCOUNTER — Ambulatory Visit (INDEPENDENT_AMBULATORY_CARE_PROVIDER_SITE_OTHER): Payer: BC Managed Care – PPO

## 2017-04-24 DIAGNOSIS — I5022 Chronic systolic (congestive) heart failure: Secondary | ICD-10-CM | POA: Diagnosis not present

## 2017-04-24 DIAGNOSIS — Z95810 Presence of automatic (implantable) cardiac defibrillator: Secondary | ICD-10-CM | POA: Diagnosis not present

## 2017-04-24 LAB — HEPATIC FUNCTION PANEL
ALT: 25 IU/L (ref 0–44)
AST: 24 IU/L (ref 0–40)
Albumin: 4 g/dL (ref 3.6–4.8)
Alkaline Phosphatase: 73 IU/L (ref 39–117)
BILIRUBIN TOTAL: 0.5 mg/dL (ref 0.0–1.2)
BILIRUBIN, DIRECT: 0.17 mg/dL (ref 0.00–0.40)
Total Protein: 6.5 g/dL (ref 6.0–8.5)

## 2017-04-24 LAB — BASIC METABOLIC PANEL
BUN/Creatinine Ratio: 14 (ref 10–24)
BUN: 17 mg/dL (ref 8–27)
CO2: 26 mmol/L (ref 20–29)
Calcium: 8.9 mg/dL (ref 8.6–10.2)
Chloride: 102 mmol/L (ref 96–106)
Creatinine, Ser: 1.18 mg/dL (ref 0.76–1.27)
GFR calc Af Amer: 75 mL/min/{1.73_m2} (ref >59)
GFR calc non Af Amer: 65 mL/min/{1.73_m2} (ref >59)
Glucose: 151 mg/dL — ABNORMAL HIGH (ref 65–99)
POTASSIUM: 4.8 mmol/L (ref 3.5–5.2)
SODIUM: 139 mmol/L (ref 134–144)

## 2017-04-24 LAB — LIPID PANEL
Chol/HDL Ratio: 2.7 ratio (ref 0.0–5.0)
Cholesterol, Total: 106 mg/dL (ref 100–199)
HDL: 39 mg/dL — ABNORMAL LOW (ref >39)
LDL Calculated: 52 mg/dL (ref 0–99)
TRIGLYCERIDES: 74 mg/dL (ref 0–149)
VLDL Cholesterol Cal: 15 mg/dL (ref 5–40)

## 2017-04-25 NOTE — Progress Notes (Signed)
EPIC Encounter for ICM Monitoring  Patient Name: Gary Brady is a 66 y.o. male Date: 04/25/2017 Primary Care Physican: Kaleen Mask, MD Primary Cardiologist:Crenshaw Electrophysiologist: Allred Dry Weight:Previous weight264lbs      Transmission received.   Thoracic impedance normal.  Prescribed dosage: Furosemide 40 mg 0.5 tablet (20 mg total) by mouth daily as needed (fluid). Klor Con 1 tablet by mouth as directed only take on days that Lasix is taken.  Recommendations: None.  Follow-up plan: ICM clinic phone appointment on 05/26/2017.   Copy of ICM check sent to Dr. Johney Frame.   3 month ICM trend: 04/24/2017    1 Year ICM trend:       [name], RN 04/25/2017 2:48 PM

## 2017-05-26 ENCOUNTER — Ambulatory Visit (INDEPENDENT_AMBULATORY_CARE_PROVIDER_SITE_OTHER): Payer: BC Managed Care – PPO | Admitting: *Deleted

## 2017-05-26 ENCOUNTER — Telehealth: Payer: Self-pay | Admitting: Cardiology

## 2017-05-26 DIAGNOSIS — Z95810 Presence of automatic (implantable) cardiac defibrillator: Secondary | ICD-10-CM

## 2017-05-26 DIAGNOSIS — I428 Other cardiomyopathies: Secondary | ICD-10-CM

## 2017-05-26 DIAGNOSIS — I5022 Chronic systolic (congestive) heart failure: Secondary | ICD-10-CM

## 2017-05-26 NOTE — Telephone Encounter (Signed)
LMOVM reminding pt to send remote transmission.   

## 2017-05-27 ENCOUNTER — Encounter: Payer: Self-pay | Admitting: Cardiology

## 2017-05-27 NOTE — Progress Notes (Signed)
Remote ICD transmission.   

## 2017-05-27 NOTE — Progress Notes (Signed)
EPIC Encounter for ICM Monitoring  Patient Name: JAYSEON NEARHOOD is a 67 y.o. male Date: 05/27/2017 Primary Care Physican: Kaleen Mask, MD Primary Cardiologist:Crenshaw Electrophysiologist: Allred Dry Weight: Previous weight264lbs      Heart Failure questions reviewed, pt asymptomatic.  Patient returned from cruise about 2 hours ago.  Has not weighed since he has been on vacation.   Thoracic impedance abnormal suggesting fluid accumulation.  Prescribed dosage: Furosemide 40 mg 0.5 tablet (20 mg total) by mouth daily as needed (fluid). Klor Con1 tablet by mouth as directed only take on days that Lasix is taken.  Recommendations:  Advised to take Furosemide 20 mg x 3 days and then return to PRN  Follow-up plan: ICM clinic phone appointment on 06/05/2017.    Copy of ICM check sent to Dr. Johney Frame and Dr. Jens Som.   3 month ICM trend: 05/27/2017    1 Year ICM trend:       [name], RN 05/27/2017 3:36 PM

## 2017-05-28 LAB — CUP PACEART REMOTE DEVICE CHECK
Battery Remaining Longevity: 96 mo
Battery Voltage: 2.99 V
Brady Statistic RV Percent Paced: 0 %
Date Time Interrogation Session: 20190122210608
HighPow Impedance: 75 Ohm
Implantable Lead Implant Date: 20141202
Implantable Lead Location: 753860
Implantable Pulse Generator Implant Date: 20141202
Lead Channel Impedance Value: 494 Ohm
Lead Channel Pacing Threshold Pulse Width: 0.4 ms
Lead Channel Sensing Intrinsic Amplitude: 7.375 mV
Lead Channel Sensing Intrinsic Amplitude: 7.375 mV
Lead Channel Setting Pacing Amplitude: 2 V
Lead Channel Setting Pacing Pulse Width: 0.4 ms
MDC IDC MSMT LEADCHNL RV IMPEDANCE VALUE: 437 Ohm
MDC IDC MSMT LEADCHNL RV PACING THRESHOLD AMPLITUDE: 0.5 V
MDC IDC SET LEADCHNL RV SENSING SENSITIVITY: 0.3 mV

## 2017-06-05 ENCOUNTER — Telehealth: Payer: Self-pay

## 2017-06-05 NOTE — Telephone Encounter (Signed)
Spoke with pt and reminded pt of remote transmission that is due today. Pt verbalized understanding.   

## 2017-06-05 NOTE — Telephone Encounter (Signed)
Patient called back and stated the monitor is not working and unable to send remote.  The wire-x only has one green light and advised the signal may not be strong enough to send.  The monitor is showing green light.  Advised to try and send later this afternoon.  If it does not work to Engineer, manufacturing number for assistance.

## 2017-06-12 NOTE — Progress Notes (Signed)
No ICM remote transmission received for 06/05/2017 and next ICM transmission scheduled for 06/30/2017.    

## 2017-06-30 ENCOUNTER — Telehealth: Payer: Self-pay | Admitting: Cardiology

## 2017-06-30 NOTE — Telephone Encounter (Signed)
Confirmed remote transmission w/ pt wife.   

## 2017-07-08 NOTE — Progress Notes (Signed)
No ICM remote transmission received for 06/30/2017 and next ICM transmission scheduled for 07/24/2017.

## 2017-07-24 ENCOUNTER — Ambulatory Visit (INDEPENDENT_AMBULATORY_CARE_PROVIDER_SITE_OTHER): Payer: BC Managed Care – PPO

## 2017-07-24 DIAGNOSIS — Z95810 Presence of automatic (implantable) cardiac defibrillator: Secondary | ICD-10-CM | POA: Diagnosis not present

## 2017-07-24 DIAGNOSIS — I5022 Chronic systolic (congestive) heart failure: Secondary | ICD-10-CM | POA: Diagnosis not present

## 2017-07-24 NOTE — Progress Notes (Signed)
EPIC Encounter for ICM Monitoring  Patient Name: Gary Brady is a 67 y.o. male Date: 07/24/2017 Primary Care Physican: Kaleen Mask, MD Primary Cardiologist:Crenshaw Electrophysiologist: Allred Dry Weight:268lbs       Heart Failure questions reviewed, pt said he has been sluggish when impedance is decreased.   Thoracic impedance normal.  He has taken Furosemide a couple of times this month.   Prescribed dosage: Furosemide 40 mg 0.5 tablet (20 mg total) by mouth daily as needed (fluid). Klor Con1 tablet by mouth as directed only take on days that Lasix is taken  Recommendations: No changes.   Encouraged to call for fluid symptoms.  Follow-up plan: ICM clinic phone appointment on 08/25/2017.   Copy of ICM check sent to Dr. Johney Frame.   3 month ICM trend: 07/24/2017    1 Year ICM trend:       [name], RN 07/24/2017 5:10 PM

## 2017-08-25 ENCOUNTER — Ambulatory Visit (INDEPENDENT_AMBULATORY_CARE_PROVIDER_SITE_OTHER): Payer: BC Managed Care – PPO | Admitting: *Deleted

## 2017-08-25 DIAGNOSIS — I428 Other cardiomyopathies: Secondary | ICD-10-CM

## 2017-08-25 DIAGNOSIS — Z95810 Presence of automatic (implantable) cardiac defibrillator: Secondary | ICD-10-CM

## 2017-08-25 DIAGNOSIS — I5022 Chronic systolic (congestive) heart failure: Secondary | ICD-10-CM

## 2017-08-25 NOTE — Progress Notes (Signed)
EPIC Encounter for ICM Monitoring  Patient Name: Gary Brady is a 67 y.o. male Date: 08/25/2017 Primary Care Physican: Kaleen Mask, MD Primary Cardiologist:Crenshaw Electrophysiologist: Allred Dry Weight:267lbs       Heart Failure questions reviewed, pt asymptomatic.  He has taken PRN Furosemide once in the last month.    Thoracic impedance normal.  Prescribed dosage:  Furosemide 40 mg 0.5 tablet (20 mg total) by mouth daily as needed (fluid). Klor Con1 tablet by mouth as directed only take on days that Lasix is taken  Recommendations: No changes.   Encouraged to call for fluid symptoms.  Follow-up plan: ICM clinic phone appointment on 09/25/2017.  He is due to schedule appointment with Gypsy Balsam, NP and will have scheduler call him to set up an appointment.   Copy of ICM check sent to Dr. Johney Frame.   3 month ICM trend: 08/25/2017    1 Year ICM trend:       [name], RN 08/25/2017 11:45 AM

## 2017-08-25 NOTE — Progress Notes (Signed)
Remote ICD transmission.   

## 2017-08-26 ENCOUNTER — Encounter: Payer: Self-pay | Admitting: Cardiology

## 2017-08-26 LAB — CUP PACEART REMOTE DEVICE CHECK
Battery Remaining Longevity: 89 mo
Brady Statistic RV Percent Paced: 0 %
Date Time Interrogation Session: 20190422052503
HighPow Impedance: 89 Ohm
Implantable Lead Implant Date: 20141202
Implantable Lead Location: 753860
Implantable Lead Model: 6935
Implantable Pulse Generator Implant Date: 20141202
Lead Channel Impedance Value: 513 Ohm
Lead Channel Pacing Threshold Amplitude: 0.5 V
Lead Channel Pacing Threshold Pulse Width: 0.4 ms
Lead Channel Sensing Intrinsic Amplitude: 7.25 mV
Lead Channel Sensing Intrinsic Amplitude: 7.25 mV
Lead Channel Setting Pacing Amplitude: 2 V
Lead Channel Setting Sensing Sensitivity: 0.3 mV
MDC IDC MSMT BATTERY VOLTAGE: 3 V
MDC IDC MSMT LEADCHNL RV IMPEDANCE VALUE: 437 Ohm
MDC IDC SET LEADCHNL RV PACING PULSEWIDTH: 0.4 ms

## 2017-09-13 ENCOUNTER — Other Ambulatory Visit: Payer: Self-pay | Admitting: Cardiology

## 2017-09-15 NOTE — Telephone Encounter (Signed)
REFILL 

## 2017-09-20 ENCOUNTER — Other Ambulatory Visit: Payer: Self-pay | Admitting: Cardiology

## 2017-09-22 NOTE — Telephone Encounter (Signed)
Rx has been sent to the pharmacy electronically. ° °

## 2017-09-22 NOTE — Telephone Encounter (Signed)
This is Dr. Crenshaw's pt 

## 2017-09-25 ENCOUNTER — Ambulatory Visit (INDEPENDENT_AMBULATORY_CARE_PROVIDER_SITE_OTHER): Payer: BC Managed Care – PPO

## 2017-09-25 DIAGNOSIS — Z95810 Presence of automatic (implantable) cardiac defibrillator: Secondary | ICD-10-CM | POA: Diagnosis not present

## 2017-09-25 DIAGNOSIS — I5022 Chronic systolic (congestive) heart failure: Secondary | ICD-10-CM

## 2017-09-26 NOTE — Progress Notes (Signed)
EPIC Encounter for ICM Monitoring  Patient Name: Gary Brady is a 67 y.o. male Date: 09/26/2017 Primary Care Physican: Kaleen Mask, MD Primary Cardiologist:Crenshaw Electrophysiologist: Allred Dry Weight:267lbs      Heart Failure questions reviewed, pt asymptomatic.   Thoracic impedance normal but just starting a downward trend on 09/25/17.  Prescribed dosage: Furosemide 40 mg 0.5 tablet (20 mg total) by mouth daily as needed (fluid). Klor Con1 tablet by mouth as directed only take on days that Lasix is taken  Recommendations: No changes.  Encouraged to call for fluid symptoms.  Follow-up plan: ICM clinic phone appointment on 12/04/2017.  Office appointment scheduled 10/29/2017 with Gypsy Balsam, NP.  Copy of ICM check sent to Dr. Johney Frame.   3 month ICM trend: 09/25/2017    1 Year ICM trend:       [name], RN 09/26/2017 11:48 AM

## 2017-10-29 ENCOUNTER — Ambulatory Visit: Payer: BC Managed Care – PPO | Admitting: Nurse Practitioner

## 2017-12-04 ENCOUNTER — Encounter: Payer: Self-pay | Admitting: Cardiology

## 2017-12-04 ENCOUNTER — Ambulatory Visit (INDEPENDENT_AMBULATORY_CARE_PROVIDER_SITE_OTHER): Payer: Medicare Other | Admitting: *Deleted

## 2017-12-04 DIAGNOSIS — I428 Other cardiomyopathies: Secondary | ICD-10-CM

## 2017-12-04 NOTE — Progress Notes (Signed)
Remote ICD transmission.   
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
## 2017-12-05 ENCOUNTER — Ambulatory Visit (INDEPENDENT_AMBULATORY_CARE_PROVIDER_SITE_OTHER): Payer: Medicare Other | Admitting: Nurse Practitioner

## 2017-12-05 VITALS — BP 110/70 | HR 76 | Ht 67.0 in | Wt 273.2 lb

## 2017-12-05 DIAGNOSIS — I5022 Chronic systolic (congestive) heart failure: Secondary | ICD-10-CM

## 2017-12-05 DIAGNOSIS — I2510 Atherosclerotic heart disease of native coronary artery without angina pectoris: Secondary | ICD-10-CM

## 2017-12-05 DIAGNOSIS — I10 Essential (primary) hypertension: Secondary | ICD-10-CM

## 2017-12-05 LAB — CUP PACEART INCLINIC DEVICE CHECK
Date Time Interrogation Session: 20190802085619
Implantable Lead Implant Date: 20141202
Implantable Lead Model: 6935
Implantable Pulse Generator Implant Date: 20141202
MDC IDC LEAD LOCATION: 753860

## 2017-12-05 MED ORDER — SPIRONOLACTONE 25 MG PO TABS: 12.5000 mg | ORAL_TABLET | Freq: Two times a day (BID) | ORAL | 3 refills | Status: DC

## 2017-12-05 NOTE — Patient Instructions (Addendum)
Medication Instructions:   Your physician recommends that you continue on your current medications as directed. Please refer to the Current Medication list given to you today.   If you need a refill on your cardiac medications before your next appointment, please call your pharmacy.  Labwork: NONE ORDERED  TODAY    Testing/Procedures: NONE ORDERED  TODAY    Follow-Up: Your physician wants you to follow-up in: ONE YEAR WITH  SEILER You will receive a reminder letter in the mail two months in advance. If you don't receive a letter, please call our office to schedule the follow-up appointment.    Remote monitoring is used to monitor your Pacemaker of ICD from home. This monitoring reduces the number of office visits required to check your device to one time per year. It allows [place] to keep an eye on the functioning of your device to ensure it is working properly. You are scheduled for a device check from home on .  02-15-18  You may send your transmission at any time that day. If you have a wireless device, the transmission will be sent automatically. After your physician reviews your transmission, you will receive a postcard with your next transmission date.     Any Other Special Instructions Will Be Listed Below (If Applicable).

## 2018-01-01 LAB — CUP PACEART REMOTE DEVICE CHECK
Battery Voltage: 2.99 V
HighPow Impedance: 79 Ohm
Implantable Lead Implant Date: 20141202
Implantable Lead Location: 753860
Implantable Pulse Generator Implant Date: 20141202
Lead Channel Impedance Value: 399 Ohm
Lead Channel Impedance Value: 456 Ohm
Lead Channel Pacing Threshold Amplitude: 0.625 V
Lead Channel Pacing Threshold Pulse Width: 0.4 ms
Lead Channel Sensing Intrinsic Amplitude: 6.25 mV
Lead Channel Sensing Intrinsic Amplitude: 6.25 mV
Lead Channel Setting Pacing Pulse Width: 0.4 ms
Lead Channel Setting Sensing Sensitivity: 0.3 mV
MDC IDC MSMT BATTERY REMAINING LONGEVITY: 85 mo
MDC IDC SESS DTM: 20190801041604
MDC IDC SET LEADCHNL RV PACING AMPLITUDE: 2 V
MDC IDC STAT BRADY RV PERCENT PACED: 0.01 %

## 2018-01-06 ENCOUNTER — Ambulatory Visit (INDEPENDENT_AMBULATORY_CARE_PROVIDER_SITE_OTHER): Payer: Medicare Other

## 2018-01-06 ENCOUNTER — Telehealth: Payer: Self-pay

## 2018-01-06 DIAGNOSIS — Z95810 Presence of automatic (implantable) cardiac defibrillator: Secondary | ICD-10-CM | POA: Diagnosis not present

## 2018-01-06 DIAGNOSIS — I5022 Chronic systolic (congestive) heart failure: Secondary | ICD-10-CM | POA: Diagnosis not present

## 2018-01-06 NOTE — Telephone Encounter (Signed)
Remote ICM transmission received.  Attempted call to patient and wife stated he was not home.  She will have him call back.

## 2018-01-06 NOTE — Progress Notes (Signed)
EPIC Encounter for ICM Monitoring  Patient Name: RAYMON GATEWOOD is a 67 y.o. male Date: 01/06/2018 Primary Care Physican: Kaleen Mask, MD Primary Cardiologist:Crenshaw Electrophysiologist: Allred Dry Weight:265lbs      Heart Failure questions reviewed, pt asymptomatic.  Played golf today and bowling tonight,    Thoracic impedance abnormal suggesting fluid accumulation starting 12/30/2017.  Prescribed dosage: Furosemide 40 mg 0.5 tablet (20 mg total) by mouth daily as needed (fluid). Klor Con1 tablet by mouth as directed only take on days that Lasix is taken  Recommendations: Advised to take PRN Furosemide 20 mg daily x days and then return to PRN.    Follow-up plan: ICM clinic phone appointment on 01/13/2018 to recheck fluid levels.       Copy of ICM check sent to Dr. Johney Frame and Dr Jens Som.   3 month ICM trend: 01/06/2018    1 Year ICM trend:       [name], RN 01/06/2018 3:15 PM

## 2018-01-13 ENCOUNTER — Ambulatory Visit (INDEPENDENT_AMBULATORY_CARE_PROVIDER_SITE_OTHER): Payer: Medicare Other

## 2018-01-13 DIAGNOSIS — Z95810 Presence of automatic (implantable) cardiac defibrillator: Secondary | ICD-10-CM

## 2018-01-13 DIAGNOSIS — I5022 Chronic systolic (congestive) heart failure: Secondary | ICD-10-CM

## 2018-01-13 NOTE — Progress Notes (Signed)
EPIC Encounter for ICM Monitoring  Patient Name: Gary Brady is a 67 y.o. male Date: 01/13/2018 Primary Care Physican: Kaleen Mask, MD Primary Cardiologist:Crenshaw Electrophysiologist: Allred Dry Weight:Previous weight 265lbs          Attempted call to patient and unable to reach.  Left detailed message, per DPR, regarding transmission.  Transmission reviewed.    Thoracic impedance returned to normal after taking PRN Furosemide x 2 days.  Prescribed dosage: Furosemide 40 mg 0.5 tablet (20 mg total) by mouth daily as needed (fluid). Klor Con1 tablet by mouth as directed only take on days that Lasix is taken  Recommendations: Left voice mail with ICM number and encouraged to call if experiencing any fluid symptoms.  Follow-up plan: ICM clinic phone appointment on 02/05/2018.    Copy of ICM check sent to Dr. Johney Frame.   3 month ICM trend: 01/13/2018    1 Year ICM trend:       [name], RN 01/13/2018 3:10 PM

## 2018-02-05 ENCOUNTER — Ambulatory Visit (INDEPENDENT_AMBULATORY_CARE_PROVIDER_SITE_OTHER): Payer: Medicare Other

## 2018-02-05 DIAGNOSIS — I5022 Chronic systolic (congestive) heart failure: Secondary | ICD-10-CM | POA: Diagnosis not present

## 2018-02-05 DIAGNOSIS — Z95810 Presence of automatic (implantable) cardiac defibrillator: Secondary | ICD-10-CM

## 2018-02-05 NOTE — Progress Notes (Signed)
EPIC Encounter for ICM Monitoring  Patient Name: Gary Brady is a 67 y.o. male Date: 02/05/2018 Primary Care Physican: Kaleen Mask, MD Primary Cardiologist:Crenshaw Electrophysiologist: Allred Dry Weight:Previous weight [id number]      Spoke with wife.  Heart Failure questions reviewed, pt asymptomatic.   Thoracic impedance returned to normal since last ICM transmission.  Prescribed: Furosemide 40 mg 0.5 tablet (20 mg total) by mouth daily as needed (fluid). Klor Con1 tablet by mouth as directed only take on days that Lasix is taken  Recommendations: No changes.    Encouraged to call for fluid symptoms.  Follow-up plan: ICM clinic phone appointment on 03/12/2018.    Copy of ICM check sent to Dr. Johney Frame.   3 month ICM trend: 02/05/2018    1 Year ICM trend:       [name], RN 02/05/2018 8:29 AM
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
## 2018-03-12 ENCOUNTER — Ambulatory Visit (INDEPENDENT_AMBULATORY_CARE_PROVIDER_SITE_OTHER): Payer: Medicare Other | Admitting: *Deleted

## 2018-03-12 ENCOUNTER — Ambulatory Visit (INDEPENDENT_AMBULATORY_CARE_PROVIDER_SITE_OTHER): Payer: Medicare Other

## 2018-03-12 DIAGNOSIS — I5022 Chronic systolic (congestive) heart failure: Secondary | ICD-10-CM

## 2018-03-12 DIAGNOSIS — Z95810 Presence of automatic (implantable) cardiac defibrillator: Secondary | ICD-10-CM

## 2018-03-12 DIAGNOSIS — I428 Other cardiomyopathies: Secondary | ICD-10-CM | POA: Diagnosis not present

## 2018-03-12 NOTE — Progress Notes (Signed)
Remote ICD transmission.   

## 2018-03-13 NOTE — Progress Notes (Signed)
EPIC Encounter for ICM Monitoring  Patient Name: Gary Brady is a 67 y.o. male Date: 03/13/2018 Primary Care Physican: Kaleen Mask, MD Primary Cardiologist:Crenshaw Electrophysiologist: Allred Last Weight: 265lbs Today's Weight: 265 lbs       Heart Failure questions reviewed, pt asymptomatic.   Thoracic impedance abnormal suggesting fluid accumulation starting 02/15/2018.   Prescribed: Furosemide 40 mg 0.5 tablet (20 mg total) by mouth daily as needed (fluid). Klor Con1 tablet by mouth as directed only take on days that Lasix is taken  Recommendations: Advised to take PRN Furosemide 0.5 tablet x 3 days and then return to PRN  Follow-up plan: ICM clinic phone appointment on 03/19/2018 to recheck fluid levels.   Office appointment scheduled 03/18/2018 with Dr. Jens Som.    Copy of ICM check sent to Dr. Johney Frame and Dr Jens Som.   3 month ICM trend: 03/12/2018    1 Year ICM trend:       [name], RN 03/13/2018 10:26 AM

## 2018-03-15 ENCOUNTER — Encounter: Payer: Self-pay | Admitting: Cardiology

## 2018-03-18 ENCOUNTER — Encounter: Payer: Self-pay | Admitting: Cardiology

## 2018-03-18 ENCOUNTER — Ambulatory Visit (INDEPENDENT_AMBULATORY_CARE_PROVIDER_SITE_OTHER): Payer: Medicare Other | Admitting: Cardiology

## 2018-03-18 VITALS — BP 108/70 | HR 74 | Ht 67.0 in | Wt 265.0 lb

## 2018-03-18 DIAGNOSIS — I5022 Chronic systolic (congestive) heart failure: Secondary | ICD-10-CM

## 2018-03-18 DIAGNOSIS — I2510 Atherosclerotic heart disease of native coronary artery without angina pectoris: Secondary | ICD-10-CM | POA: Diagnosis not present

## 2018-03-18 DIAGNOSIS — Z79899 Other long term (current) drug therapy: Secondary | ICD-10-CM

## 2018-03-18 MED ORDER — SACUBITRIL-VALSARTAN 24-26 MG PO TABS: 1.0000 | ORAL_TABLET | Freq: Two times a day (BID) | ORAL | 3 refills | Status: DC

## 2018-03-18 NOTE — Patient Instructions (Addendum)
Medication Instructions:  STOP Losartan  START Entresto 24/26 mg (1 tablet) two times daily  If you need a refill on your cardiac medications before your next appointment, please call your pharmacy.   Lab work: Please return for FASTING labs in 1 week (BMET, Lipid, Hepatic)  Our in office lab hours are Monday-Friday 8:00-4:00, closed for lunch 12:45-1:45 pm.  No appointment needed.  If you have labs (blood work) drawn today and your tests are completely normal, you will receive your results only by: Marland Kitchen MyChart Message (if you have MyChart) OR . A paper copy in the mail If you have any lab test that is abnormal or we need to change your treatment, we will call you to review the results.  Testing/Procedures: Your physician has requested that you have an echocardiogram. Echocardiography is a painless test that uses sound waves to create images of your heart. It provides your doctor with information about the size and shape of your heart and how well your heart's chambers and valves are working. This procedure takes approximately one hour. There are no restrictions for this procedure.  Follow-Up: At [facility], you and your health needs are our priority.  As part of our continuing mission to provide you with exceptional heart care, we have created designated Provider Care Teams.  These Care Teams include your primary Cardiologist (physician) and Advanced Practice Providers (APPs -  Physician Assistants and Nurse Practitioners) who all work together to provide you with the care you need, when you need it. You will need a follow up appointment in 6 months.  Please call our office 2 months in advance to schedule this appointment.  You may see [name], MD or one of the following Advanced Practice Providers on your designated Care Team:   Corine Shelter, PA-C Judy Pimple, [place] . Marjie Skiff, PA-C

## 2018-03-19 ENCOUNTER — Ambulatory Visit (INDEPENDENT_AMBULATORY_CARE_PROVIDER_SITE_OTHER): Payer: Medicare Other

## 2018-03-19 ENCOUNTER — Other Ambulatory Visit: Payer: Self-pay | Admitting: Cardiology

## 2018-03-19 DIAGNOSIS — I5022 Chronic systolic (congestive) heart failure: Secondary | ICD-10-CM

## 2018-03-19 DIAGNOSIS — Z95810 Presence of automatic (implantable) cardiac defibrillator: Secondary | ICD-10-CM

## 2018-03-20 NOTE — Progress Notes (Signed)
EPIC Encounter for ICM Monitoring  Patient Name: Gary Brady is a 67 y.o. male Date: 03/20/2018 Primary Care Physican: Kaleen Mask, MD Primary Cardiologist:Crenshaw Electrophysiologist: Allred Last Weight: 265lbs Today's Weight: 261 lbs        Heart Failure questions reviewed, pt asymptomatic.   Thoracic impedance returned to normal after 3 days of PRN Furosemide.   Prescribed: Furosemide 40 mg 0.5 tablet (20 mg total) by mouth daily as needed (fluid). Klor Con1 tablet by mouth as directed only take on days that Lasix is taken  Recommendations: No changes.   Encouraged to call for fluid symptoms.  Follow-up plan: ICM clinic phone appointment on 04/13/2018.       Copy of ICM check sent to Dr. Johney Frame.   3 month ICM trend: 03/20/2018    1 Year ICM trend:       [name], RN 03/20/2018 9:28 AM

## 2018-03-25 ENCOUNTER — Other Ambulatory Visit: Payer: Self-pay

## 2018-03-25 ENCOUNTER — Ambulatory Visit (HOSPITAL_COMMUNITY): Payer: Medicare Other | Attending: Cardiology

## 2018-03-25 DIAGNOSIS — I5022 Chronic systolic (congestive) heart failure: Secondary | ICD-10-CM | POA: Diagnosis not present

## 2018-03-25 MED ORDER — PERFLUTREN LIPID MICROSPHERE
1.0000 mL | INTRAVENOUS | Status: AC | PRN
  Administered 2018-03-25: 3 mL via INTRAVENOUS

## 2018-03-26 LAB — HEPATIC FUNCTION PANEL
ALK PHOS: 92 IU/L (ref 39–117)
ALT: 20 IU/L (ref 0–44)
AST: 17 IU/L (ref 0–40)
Albumin: 4.2 g/dL (ref 3.6–4.8)
BILIRUBIN, DIRECT: 0.15 mg/dL (ref 0.00–0.40)
Bilirubin Total: 0.5 mg/dL (ref 0.0–1.2)
TOTAL PROTEIN: 6.7 g/dL (ref 6.0–8.5)

## 2018-03-26 LAB — LIPID PANEL
CHOLESTEROL TOTAL: 88 mg/dL — AB (ref 100–199)
Chol/HDL Ratio: 2.8 ratio (ref 0.0–5.0)
HDL: 32 mg/dL — ABNORMAL LOW (ref >39)
LDL CALC: 34 mg/dL (ref 0–99)
TRIGLYCERIDES: 110 mg/dL (ref 0–149)
VLDL Cholesterol Cal: 22 mg/dL (ref 5–40)

## 2018-03-26 LAB — BASIC METABOLIC PANEL
BUN / CREAT RATIO: 11 (ref 10–24)
BUN: 14 mg/dL (ref 8–27)
CHLORIDE: 99 mmol/L (ref 96–106)
CO2: 20 mmol/L (ref 20–29)
Calcium: 8.9 mg/dL (ref 8.6–10.2)
Creatinine, Ser: 1.3 mg/dL — ABNORMAL HIGH (ref 0.76–1.27)
GFR calc non Af Amer: 57 mL/min/{1.73_m2} — ABNORMAL LOW (ref >59)
GFR, EST AFRICAN AMERICAN: 66 mL/min/{1.73_m2} (ref >59)
Glucose: 165 mg/dL — ABNORMAL HIGH (ref 65–99)
POTASSIUM: 4.7 mmol/L (ref 3.5–5.2)
Sodium: 136 mmol/L (ref 134–144)

## 2018-03-27 ENCOUNTER — Encounter: Payer: Self-pay | Admitting: *Deleted

## 2018-04-03 ENCOUNTER — Other Ambulatory Visit: Payer: Self-pay | Admitting: Cardiology

## 2018-04-13 ENCOUNTER — Ambulatory Visit (INDEPENDENT_AMBULATORY_CARE_PROVIDER_SITE_OTHER): Payer: Medicare Other

## 2018-04-13 DIAGNOSIS — I5022 Chronic systolic (congestive) heart failure: Secondary | ICD-10-CM | POA: Diagnosis not present

## 2018-04-13 DIAGNOSIS — Z95810 Presence of automatic (implantable) cardiac defibrillator: Secondary | ICD-10-CM | POA: Diagnosis not present

## 2018-04-16 NOTE — Progress Notes (Signed)
EPIC Encounter for ICM Monitoring  Patient Name: Gary Brady is a 67 y.o. male Date: 04/16/2018 Primary Care Physican: Kaleen Mask, MD Primary Cardiologist:Crenshaw Electrophysiologist: Allred Last Weight:265lbs Today's Weight: unknown                                                   Transmission reviewed   Thoracic impedance normal.  Prescribed: Furosemide 40 mg 0.5 tablet (20 mg total) by mouth daily as needed (fluid). Klor Con1 tablet by mouth as directed only take on days that Lasix is taken  Recommendations: No changes. .  Follow-up plan: ICM clinic phone appointment on 05/21/2018.       Copy of ICM check sent to Dr. Johney Frame.   3 month ICM trend: 04/13/2018    1 Year ICM trend:       [name], RN 04/16/2018 2:34 PM

## 2018-05-14 LAB — CUP PACEART REMOTE DEVICE CHECK
Battery Remaining Longevity: 79 mo
HighPow Impedance: 84 Ohm
Implantable Lead Implant Date: 20141202
Implantable Lead Location: 753860
Implantable Pulse Generator Implant Date: 20141202
Lead Channel Impedance Value: 494 Ohm
Lead Channel Impedance Value: 532 Ohm
Lead Channel Pacing Threshold Amplitude: 0.5 V
Lead Channel Sensing Intrinsic Amplitude: 7.375 mV
Lead Channel Setting Pacing Pulse Width: 0.4 ms
MDC IDC MSMT BATTERY VOLTAGE: 2.99 V
MDC IDC MSMT LEADCHNL RV PACING THRESHOLD PULSEWIDTH: 0.4 ms
MDC IDC MSMT LEADCHNL RV SENSING INTR AMPL: 7.375 mV
MDC IDC SESS DTM: 20191107072725
MDC IDC SET LEADCHNL RV PACING AMPLITUDE: 2 V
MDC IDC SET LEADCHNL RV SENSING SENSITIVITY: 0.3 mV
MDC IDC STAT BRADY RV PERCENT PACED: 0.01 %

## 2018-05-26 NOTE — Progress Notes (Signed)
No ICM remote transmission received for 05/21/2018 and next ICM transmission scheduled for 06/11/2018.   

## 2018-06-11 ENCOUNTER — Ambulatory Visit (INDEPENDENT_AMBULATORY_CARE_PROVIDER_SITE_OTHER): Payer: Medicare Other

## 2018-06-11 DIAGNOSIS — I5022 Chronic systolic (congestive) heart failure: Secondary | ICD-10-CM

## 2018-06-11 DIAGNOSIS — I428 Other cardiomyopathies: Secondary | ICD-10-CM

## 2018-06-12 ENCOUNTER — Ambulatory Visit (INDEPENDENT_AMBULATORY_CARE_PROVIDER_SITE_OTHER): Payer: Medicare Other

## 2018-06-12 ENCOUNTER — Telehealth: Payer: Self-pay

## 2018-06-12 DIAGNOSIS — I5022 Chronic systolic (congestive) heart failure: Secondary | ICD-10-CM | POA: Diagnosis not present

## 2018-06-12 DIAGNOSIS — Z95810 Presence of automatic (implantable) cardiac defibrillator: Secondary | ICD-10-CM

## 2018-06-12 LAB — CUP PACEART REMOTE DEVICE CHECK
Battery Remaining Longevity: 75 mo
Battery Voltage: 2.99 V
Brady Statistic RV Percent Paced: 0 %
Date Time Interrogation Session: 20200206062303
HighPow Impedance: 80 Ohm
Implantable Lead Location: 753860
Implantable Lead Model: 6935
Lead Channel Impedance Value: 399 Ohm
Lead Channel Impedance Value: 456 Ohm
Lead Channel Setting Pacing Amplitude: 2 V
MDC IDC LEAD IMPLANT DT: 20141202
MDC IDC MSMT LEADCHNL RV PACING THRESHOLD AMPLITUDE: 0.5 V
MDC IDC MSMT LEADCHNL RV PACING THRESHOLD PULSEWIDTH: 0.4 ms
MDC IDC MSMT LEADCHNL RV SENSING INTR AMPL: 5.625 mV
MDC IDC MSMT LEADCHNL RV SENSING INTR AMPL: 5.625 mV
MDC IDC PG IMPLANT DT: 20141202
MDC IDC SET LEADCHNL RV PACING PULSEWIDTH: 0.4 ms
MDC IDC SET LEADCHNL RV SENSING SENSITIVITY: 0.3 mV

## 2018-06-12 NOTE — Progress Notes (Signed)
EPIC Encounter for ICM Monitoring  Patient Name: Gary Brady is a 68 y.o. male Date: 06/12/2018 Primary Care Physican: Kaleen Mask, MD Primary Cardiologist:Crenshaw Electrophysiologist: Allred Last Weight:265lbs Today's Weight: unknown   Attempted call to patient and unable to reach.  Left detailed message per DPR regarding transmission. Transmission reviewed.   Thoracic impedancenormal.  Prescribed:Furosemide 40 mg 0.5 tablet (20 mg total) by mouth daily as needed (fluid). Klor Con1 tablet by mouth as directed only take on days that Lasix is taken  Recommendations: Left voice mail with ICM number and encouraged to call if experiencing any fluid symptoms.  Follow-up plan: ICM clinic phone appointment on3/02/2019.   Copy of ICM check sent to Dr.Allred.  3 month ICM trend: 06/11/2018    1 Year ICM trend:       [name], RN 06/12/2018 10:04 AM

## 2018-06-12 NOTE — Telephone Encounter (Signed)
Remote ICM transmission received.  Attempted call to patient regarding ICM remote transmission and left detailed message, per DPR, with next ICM remote transmission date of 07/14/2018.  Advised to return call for any fluid symptoms or questions.

## 2018-06-23 NOTE — Progress Notes (Signed)
Remote ICD transmission.   

## 2018-07-14 ENCOUNTER — Ambulatory Visit (INDEPENDENT_AMBULATORY_CARE_PROVIDER_SITE_OTHER): Payer: Medicare Other

## 2018-07-14 DIAGNOSIS — Z95810 Presence of automatic (implantable) cardiac defibrillator: Secondary | ICD-10-CM

## 2018-07-14 DIAGNOSIS — I5022 Chronic systolic (congestive) heart failure: Secondary | ICD-10-CM | POA: Diagnosis not present

## 2018-07-15 ENCOUNTER — Telehealth: Payer: Self-pay

## 2018-07-15 NOTE — Telephone Encounter (Signed)
Remote ICM transmission received.  Attempted call to patient regarding ICM remote transmission and left detailed message, per DPR, with next ICM remote transmission date of 07/22/2018.  Advised to return call for any fluid symptoms or questions.

## 2018-07-15 NOTE — Progress Notes (Signed)
EPIC Encounter for ICM Monitoring  Patient Name: Gary Brady is a 68 y.o. male Date: 07/15/2018 Primary Care Physican: Kaleen Mask, MD Primary Cardiologist:Crenshaw Electrophysiologist: Allred Last Weight:265lbs Today's Weight:unknown   Attempted call to patient and unable to reach.  Left detailed message per DPR regarding transmission. Transmission reviewed.   Thoracic impedanceabnormal suggesting fluid accumulation but trending toward baseline.  Prescribed:Furosemide 40 mg 0.5 tablet (20 mg total) by mouth daily as needed (fluid). Klor Con 20 mEq1 tablet by mouth as directed only take on days that Lasix is taken  Recommendations:Left voice mail with ICM number and encouraged to call if experiencing any fluid symptoms.  Advised to take PRN 20 mg Furosemide if having symptoms.  Follow-up plan: ICM clinic phone appointment on3/18/2020 to recheck fluid levels.   Copy of ICM check sent to Dr.Allred and Dr Jens Som.  3 month ICM trend: 07/14/2018    1 Year ICM trend:       [name], RN 07/15/2018 10:53 AM

## 2018-07-17 ENCOUNTER — Other Ambulatory Visit: Payer: Self-pay | Admitting: Cardiology

## 2018-07-22 ENCOUNTER — Ambulatory Visit (INDEPENDENT_AMBULATORY_CARE_PROVIDER_SITE_OTHER): Payer: Medicare Other

## 2018-07-22 ENCOUNTER — Other Ambulatory Visit: Payer: Self-pay

## 2018-07-22 DIAGNOSIS — I5022 Chronic systolic (congestive) heart failure: Secondary | ICD-10-CM

## 2018-07-22 DIAGNOSIS — Z95810 Presence of automatic (implantable) cardiac defibrillator: Secondary | ICD-10-CM

## 2018-07-22 NOTE — Progress Notes (Signed)
EPIC Encounter for ICM Monitoring  Patient Name: Gary Brady is a 68 y.o. male Date: 07/22/2018 Primary Care Physican: Kaleen Mask, MD Primary Cardiologist:Crenshaw Electrophysiologist: Allred Last Weight:265lbs Today's Weight:unknown   Heart failure questions reviewed.  He is asymptomatic.  He took PRN furosemide x 4 days   Thoracic impedancereturn to normal after last ICM remote transmission 07/14/2018  Prescribed:Furosemide 40 mg 0.5 tablet (20 mg total) by mouth daily as needed (fluid). Klor Con 20 mEq1 tablet by mouth as directed only take on days that Lasix is taken  Recommendations: No changes and encouraged to call for fluid symptoms.   Follow-up plan: ICM clinic phone appointment on4/13/2020.   Copy of ICM check sent to Dr.Allred..  3 month ICM trend: 07/22/2018    1 Year ICM trend:       [name], RN 07/22/2018 12:48 PM

## 2018-08-17 ENCOUNTER — Ambulatory Visit (INDEPENDENT_AMBULATORY_CARE_PROVIDER_SITE_OTHER): Payer: Medicare Other

## 2018-08-17 ENCOUNTER — Other Ambulatory Visit: Payer: Self-pay

## 2018-08-17 DIAGNOSIS — Z95810 Presence of automatic (implantable) cardiac defibrillator: Secondary | ICD-10-CM

## 2018-08-17 DIAGNOSIS — I5022 Chronic systolic (congestive) heart failure: Secondary | ICD-10-CM | POA: Diagnosis not present

## 2018-08-18 NOTE — Progress Notes (Signed)
EPIC Encounter for ICM Monitoring  Patient Name: Gary Brady is a 68 y.o. male Date: 08/18/2018 Primary Care Physican: Kaleen Mask, MD Primary Cardiologist:Crenshaw Electrophysiologist: Allred Last Weight:265lbs 08/19/2018 Weight:265 lbs   Heart failure questions reviewed.  He is asymptomatic.  He said he took a couple of PRN Furosemide during decreased impedance.   Thoracic impedance normal.  Prescribed:Furosemide 40 mg 0.5 tablet (20 mg total) by mouth daily as needed (fluid). Klor Con20 mEq1 tablet by mouth as directed only take on days that Lasix is taken  Recommendations: No changes and encouraged to call for fluid symptoms.   Follow-up plan: ICM clinic phone appointment on5/15/2020.   Copy of ICM check sent to Dr.Allred.   3 month ICM trend: 08/17/2018    1 Year ICM trend:       [name], RN 08/18/2018 9:26 AM

## 2018-09-17 ENCOUNTER — Other Ambulatory Visit: Payer: Self-pay

## 2018-09-17 ENCOUNTER — Ambulatory Visit (INDEPENDENT_AMBULATORY_CARE_PROVIDER_SITE_OTHER): Payer: Medicare Other | Admitting: *Deleted

## 2018-09-17 DIAGNOSIS — I428 Other cardiomyopathies: Secondary | ICD-10-CM | POA: Diagnosis not present

## 2018-09-17 LAB — CUP PACEART REMOTE DEVICE CHECK
Battery Remaining Longevity: 65 mo
Battery Voltage: 2.99 V
Brady Statistic RV Percent Paced: 0 %
Date Time Interrogation Session: 20200514062725
HighPow Impedance: 83 Ohm
Implantable Lead Implant Date: 20141202
Implantable Lead Location: 753860
Implantable Lead Model: 6935
Implantable Pulse Generator Implant Date: 20141202
Lead Channel Impedance Value: 437 Ohm
Lead Channel Impedance Value: 513 Ohm
Lead Channel Pacing Threshold Amplitude: 0.5 V
Lead Channel Pacing Threshold Pulse Width: 0.4 ms
Lead Channel Sensing Intrinsic Amplitude: 6.875 mV
Lead Channel Sensing Intrinsic Amplitude: 6.875 mV
Lead Channel Setting Pacing Amplitude: 2 V
Lead Channel Setting Pacing Pulse Width: 0.4 ms
Lead Channel Setting Sensing Sensitivity: 0.3 mV

## 2018-09-18 ENCOUNTER — Other Ambulatory Visit: Payer: Self-pay

## 2018-09-18 ENCOUNTER — Ambulatory Visit (INDEPENDENT_AMBULATORY_CARE_PROVIDER_SITE_OTHER): Payer: Medicare Other

## 2018-09-18 DIAGNOSIS — I5022 Chronic systolic (congestive) heart failure: Secondary | ICD-10-CM

## 2018-09-18 DIAGNOSIS — Z95810 Presence of automatic (implantable) cardiac defibrillator: Secondary | ICD-10-CM

## 2018-09-22 NOTE — Progress Notes (Signed)
EPIC Encounter for ICM Monitoring  Patient Name: Gary Brady is a 68 y.o. male Date: 09/22/2018 Primary Care Physican: Kaleen Mask, MD Primary Cardiologist:Crenshaw Electrophysiologist: Allred 08/19/2018 Weight:265 lbs   Transmission received.   Thoracic impedance normal.  Prescribed:Furosemide 40 mg 0.5 tablet (20 mg total) by mouth daily as needed (fluid). Klor Con20 mEq1 tablet by mouth as directed only take on days that Lasix is taken  Recommendations: None  Follow-up plan: ICM clinic phone appointment on6/16/2020.   Copy of ICM check sent to Dr.Allred.    3 month ICM trend: 09/17/2018    1 Year ICM trend:       [name], RN 09/22/2018 8:37 AM

## 2018-09-23 ENCOUNTER — Telehealth: Payer: Self-pay | Admitting: Cardiology

## 2018-09-23 NOTE — Telephone Encounter (Signed)
Mychart, no smartphone, consent (verbal), pre reg complete 09/23/18 AF
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
## 2018-09-24 ENCOUNTER — Telehealth (INDEPENDENT_AMBULATORY_CARE_PROVIDER_SITE_OTHER): Payer: Medicare Other | Admitting: Cardiology

## 2018-09-24 ENCOUNTER — Encounter: Payer: Self-pay | Admitting: Cardiology

## 2018-09-24 VITALS — BP 132/87 | HR 97 | Ht 67.0 in | Wt 256.0 lb

## 2018-09-24 DIAGNOSIS — I2510 Atherosclerotic heart disease of native coronary artery without angina pectoris: Secondary | ICD-10-CM

## 2018-09-24 DIAGNOSIS — E7800 Pure hypercholesterolemia, unspecified: Secondary | ICD-10-CM

## 2018-09-24 DIAGNOSIS — I5022 Chronic systolic (congestive) heart failure: Secondary | ICD-10-CM

## 2018-09-24 DIAGNOSIS — I10 Essential (primary) hypertension: Secondary | ICD-10-CM

## 2018-09-24 DIAGNOSIS — I428 Other cardiomyopathies: Secondary | ICD-10-CM

## 2018-09-24 NOTE — Progress Notes (Signed)
Remote ICD transmission.   

## 2018-09-24 NOTE — Patient Instructions (Signed)
Medication Instructions:  NO CHANGE If you need a refill on your cardiac medications before your next appointment, please call your pharmacy.   Lab work: Your physician recommends that you return for lab work PRIOR TO EATING If you have labs (blood work) drawn today and your tests are completely normal, you will receive your results only by: . MyChart Message (if you have MyChart) OR . A paper copy in the mail If you have any lab test that is abnormal or we need to change your treatment, we will call you to review the results.  Follow-Up: At [facility], you and your health needs are our priority.  As part of our continuing mission to provide you with exceptional heart care, we have created designated Provider Care Teams.  These Care Teams include your primary Cardiologist (physician) and Advanced Practice Providers (APPs -  Physician Assistants and Nurse Practitioners) who all work together to provide you with the care you need, when you need it. You will need a follow up appointment in 6 months.  Please call our office 2 months in advance to schedule this appointment.  You may see Brian Crenshaw, MD or one of the following Advanced Practice Providers on your designated Care Team:   Luke Kilroy, PA-C Krista Kroeger, PA-C . Callie Goodrich, PA-C     

## 2018-09-25 ENCOUNTER — Other Ambulatory Visit: Payer: Self-pay | Admitting: Nurse Practitioner

## 2018-09-25 ENCOUNTER — Other Ambulatory Visit: Payer: Self-pay | Admitting: Cardiology

## 2018-10-02 NOTE — Telephone Encounter (Signed)
Returned call to pt he states that entresto is too expensive and is in the donut hole. Can you change to something less expensive. He states that it is $147/month and he cannot afford medication

## 2018-10-02 NOTE — Telephone Encounter (Signed)
Spoke with pt, entresto patient assistance mailed to the patient to complete.

## 2018-10-02 NOTE — Telephone Encounter (Signed)
F/U Message            Patient is calling about medication that is to expensive, would like a call to discuss a new a medication.

## 2018-10-09 ENCOUNTER — Other Ambulatory Visit: Payer: Self-pay | Admitting: Cardiology

## 2018-10-20 ENCOUNTER — Telehealth: Payer: Self-pay

## 2018-10-20 NOTE — Telephone Encounter (Signed)
Left message for patient to remind of missed remote transmission.  

## 2018-11-02 NOTE — Progress Notes (Signed)
No ICM remote transmission received for 10/20/2018 and next ICM transmission scheduled for 11/23/2018.

## 2018-11-23 ENCOUNTER — Ambulatory Visit (INDEPENDENT_AMBULATORY_CARE_PROVIDER_SITE_OTHER): Payer: Medicare Other

## 2018-11-23 DIAGNOSIS — Z95810 Presence of automatic (implantable) cardiac defibrillator: Secondary | ICD-10-CM

## 2018-11-23 DIAGNOSIS — I5022 Chronic systolic (congestive) heart failure: Secondary | ICD-10-CM

## 2018-11-27 ENCOUNTER — Telehealth: Payer: Self-pay

## 2018-11-27 NOTE — Telephone Encounter (Signed)
Remote ICM transmission received.  Attempted call to patient regarding ICM remote transmission and left detailed message, per DPR, with next ICM remote transmission date of 12/29/2018.  Advised to return call for any fluid symptoms or questions.

## 2018-11-27 NOTE — Progress Notes (Signed)
EPIC Encounter for ICM Monitoring  Patient Name: Gary Brady is a 68 y.o. male Date: 11/27/2018 Primary Care Physican: Leonard Downing, MD Primary Cardiologist:Crenshaw Electrophysiologist: Allred 7/24/2020Weight: unknown   Attempted call to patient and unable to reach.  Left detailed message per DPR regarding transmission. Transmission reviewed.   Thoracic impedance normal.  Prescribed:Furosemide 40 mg 0.5 tablet (20 mg total) by mouth daily as needed (fluid). Klor Con20 mEq1 tablet by mouth as directed only take on days that Lasix is taken  Recommendations:Left voice mail with ICM number and encouraged to call if experiencing any fluid symptoms.  Follow-up plan: ICM clinic phone appointment on8/25/2020.   Copy of ICM check sent to Dr.Allred.    3 month ICM trend: 11/23/2018    1 Year ICM trend:       Rosalene Billings, RN 11/27/2018 9:33 AM

## 2018-12-17 ENCOUNTER — Ambulatory Visit (INDEPENDENT_AMBULATORY_CARE_PROVIDER_SITE_OTHER): Payer: Medicare Other | Admitting: *Deleted

## 2018-12-17 DIAGNOSIS — I428 Other cardiomyopathies: Secondary | ICD-10-CM | POA: Diagnosis not present

## 2018-12-17 LAB — CUP PACEART REMOTE DEVICE CHECK
Battery Remaining Longevity: 59 mo
Battery Voltage: 2.98 V
Brady Statistic RV Percent Paced: 0.01 %
Date Time Interrogation Session: 20200813062723
HighPow Impedance: 93 Ohm
Implantable Lead Implant Date: 20141202
Implantable Lead Location: 753860
Implantable Lead Model: 6935
Implantable Pulse Generator Implant Date: 20141202
Lead Channel Impedance Value: 456 Ohm
Lead Channel Impedance Value: 532 Ohm
Lead Channel Pacing Threshold Amplitude: 0.5 V
Lead Channel Pacing Threshold Pulse Width: 0.4 ms
Lead Channel Sensing Intrinsic Amplitude: 9 mV
Lead Channel Setting Pacing Amplitude: 2 V
Lead Channel Setting Pacing Pulse Width: 0.4 ms
Lead Channel Setting Sensing Sensitivity: 0.3 mV

## 2018-12-28 ENCOUNTER — Encounter: Payer: Self-pay | Admitting: Cardiology

## 2018-12-28 NOTE — Progress Notes (Signed)
Remote ICD transmission.   

## 2018-12-29 ENCOUNTER — Telehealth: Payer: Self-pay

## 2018-12-29 ENCOUNTER — Ambulatory Visit (INDEPENDENT_AMBULATORY_CARE_PROVIDER_SITE_OTHER): Payer: Medicare Other

## 2018-12-29 DIAGNOSIS — I5022 Chronic systolic (congestive) heart failure: Secondary | ICD-10-CM

## 2018-12-29 DIAGNOSIS — Z95810 Presence of automatic (implantable) cardiac defibrillator: Secondary | ICD-10-CM

## 2018-12-29 NOTE — Telephone Encounter (Signed)
Left message for patient to remind of missed remote transmission.  

## 2018-12-30 ENCOUNTER — Other Ambulatory Visit: Payer: Self-pay | Admitting: Cardiology

## 2019-01-01 ENCOUNTER — Telehealth: Payer: Self-pay

## 2019-01-01 NOTE — Telephone Encounter (Signed)
Remote ICM transmission received.  Attempted call to patient regarding ICM remote transmission and left detailed message per DPR.  Advised to return call for any fluid symptoms or questions. Next ICM remote transmission scheduled 02/22/2019.     

## 2019-01-01 NOTE — Progress Notes (Signed)
EPIC Encounter for ICM Monitoring  Patient Name: Gary Brady is a 68 y.o. male Date: 01/01/2019 Primary Care Physican: Leonard Downing, MD Primary Cardiologist:Crenshaw Electrophysiologist: Allred 7/24/2020Weight: unknown   Attempted call to patient and unable to reach.  Left detailed message per DPR regarding transmission. Transmission reviewed.   Optivol Thoracic impedance suggesting possible fluid accumulation 12/23/2018 and trending to baseline normal.  Prescribed:Furosemide 40 mg 0.5 tablet (20 mg total) by mouth daily as needed (fluid). Klor Con20 mEq1 tablet by mouth as directed only take on days that Lasix is taken  Recommendations:Left voice mail with ICM number and encouraged to call if experiencing any fluid symptoms.  Follow-up plan: ICM clinic phone appointment on10/19/2020. OV with Chanetta Marshall, NP 01/20/2019.  Copy of ICM check sent to Dr.Allred.  3 month ICM trend: 12/30/2018    1 Year ICM trend:       Rosalene Billings, RN 01/01/2019 2:55 PM

## 2019-01-04 ENCOUNTER — Telehealth: Payer: Self-pay | Admitting: Cardiology

## 2019-01-04 ENCOUNTER — Telehealth: Payer: Self-pay

## 2019-01-04 NOTE — Telephone Encounter (Signed)
Spoke with pt, aware samples at the front desk for pick uip, patient assistance paperwork included with the samples.

## 2019-01-04 NOTE — Telephone Encounter (Signed)
Returned patient call.  He reports he cannot afford Entresto that has a $300 monthly copay.  He asked that I send a message to Dr Stanford Breed to inform him he cannot afford the medication and asked for a replacement.   Advised I would forward the phone message to Fredia Beets, Dr Jacalyn Lefevre nurse, and she will follow up with him. He said he is feeling fine.

## 2019-01-04 NOTE — Progress Notes (Signed)
Patient returned call and said he is feeling fine.  He did feel like he may have a little fluid today so he took PRN Furosemide.  He requested I send message to Dr Stanford Breed to let him know he cannot afford a $300 monthly Entreso copay and asked if there is a replacement for it.  Advised would send information to Fredia Beets, Dr Jacalyn Lefevre nurse for follow up.  No changes and encouraged to call if experiencing any fluid symptoms.

## 2019-01-04 NOTE — Telephone Encounter (Signed)
error 

## 2019-01-20 ENCOUNTER — Encounter: Payer: Medicare Other | Admitting: Nurse Practitioner
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
## 2019-02-17 ENCOUNTER — Ambulatory Visit (INDEPENDENT_AMBULATORY_CARE_PROVIDER_SITE_OTHER): Payer: Medicare Other | Admitting: Student

## 2019-02-17 ENCOUNTER — Other Ambulatory Visit: Payer: Self-pay

## 2019-02-17 ENCOUNTER — Encounter: Payer: Medicare Other | Admitting: Nurse Practitioner

## 2019-02-17 VITALS — BP 108/74 | HR 79 | Ht 67.0 in | Wt 245.0 lb

## 2019-02-17 DIAGNOSIS — Z95810 Presence of automatic (implantable) cardiac defibrillator: Secondary | ICD-10-CM | POA: Diagnosis not present

## 2019-02-17 DIAGNOSIS — I2510 Atherosclerotic heart disease of native coronary artery without angina pectoris: Secondary | ICD-10-CM | POA: Diagnosis not present

## 2019-02-17 DIAGNOSIS — I10 Essential (primary) hypertension: Secondary | ICD-10-CM | POA: Diagnosis not present

## 2019-02-17 DIAGNOSIS — I5022 Chronic systolic (congestive) heart failure: Secondary | ICD-10-CM | POA: Diagnosis not present

## 2019-02-17 LAB — CUP PACEART INCLINIC DEVICE CHECK
Battery Remaining Longevity: 53 mo
Battery Voltage: 2.98 V
Brady Statistic RV Percent Paced: 0.01 %
Date Time Interrogation Session: 20201014085937
HighPow Impedance: 79 Ohm
Implantable Lead Implant Date: 20141202
Implantable Lead Location: 753860
Implantable Lead Model: 6935
Implantable Pulse Generator Implant Date: 20141202
Lead Channel Impedance Value: 399 Ohm
Lead Channel Impedance Value: 494 Ohm
Lead Channel Pacing Threshold Amplitude: 0.5 V
Lead Channel Pacing Threshold Pulse Width: 0.4 ms
Lead Channel Sensing Intrinsic Amplitude: 7.375 mV
Lead Channel Sensing Intrinsic Amplitude: 7.75 mV
Lead Channel Setting Pacing Amplitude: 2 V
Lead Channel Setting Pacing Pulse Width: 0.4 ms
Lead Channel Setting Sensing Sensitivity: 0.3 mV

## 2019-02-17 LAB — BASIC METABOLIC PANEL
BUN/Creatinine Ratio: 15 (ref 10–24)
BUN: 20 mg/dL (ref 8–27)
CO2: 21 mmol/L (ref 20–29)
Calcium: 9.7 mg/dL (ref 8.6–10.2)
Chloride: 100 mmol/L (ref 96–106)
Creatinine, Ser: 1.33 mg/dL — ABNORMAL HIGH (ref 0.76–1.27)
GFR calc Af Amer: 64 mL/min/{1.73_m2} (ref >59)
GFR calc non Af Amer: 55 mL/min/{1.73_m2} — ABNORMAL LOW (ref >59)
Glucose: 178 mg/dL — ABNORMAL HIGH (ref 65–99)
Potassium: 4.4 mmol/L (ref 3.5–5.2)
Sodium: 137 mmol/L (ref 134–144)

## 2019-02-17 NOTE — Patient Instructions (Addendum)
Medication Instructions:   STOP TAKING: LOSARTAN 25 MG   If you need a refill on your cardiac medications before your next appointment, please call your pharmacy.   Lab work: BMET TODAY   If you have labs (blood work) drawn today and your tests are completely normal, you will receive your results only by: Marland Kitchen MyChart Message (if you have MyChart) OR . A paper copy in the mail If you have any lab test that is abnormal or we need to change your treatment, we will call you to review the results.  Testing/Procedures: NONE ORDERED  TODAY   Follow-Up:  In 2 weeks nurse Visit for blood pressure check   At [facility], you and your health needs are our priority.  As part of our continuing mission to provide you with exceptional heart care, we have created designated Provider Care Teams.  These Care Teams include your primary Cardiologist (physician) and Advanced Practice Providers (APPs -  Physician Assistants and Nurse Practitioners) who all work together to provide you with the care you need, when you need it. You will need a follow up appointment in 1 years.  Please call our office 2 months in advance to schedule this appointment.  You may see  one of the following Advanced Practice Providers on your designated Care Team:   Chanetta Marshall, NP . Tommye Standard, PA-C . Joesph July PA-C  Any Other Special Instructions Will Be Listed Below (If Applicable).

## 2019-02-22 ENCOUNTER — Ambulatory Visit (INDEPENDENT_AMBULATORY_CARE_PROVIDER_SITE_OTHER): Payer: Medicare Other

## 2019-02-22 DIAGNOSIS — Z95810 Presence of automatic (implantable) cardiac defibrillator: Secondary | ICD-10-CM

## 2019-02-22 DIAGNOSIS — I5022 Chronic systolic (congestive) heart failure: Secondary | ICD-10-CM | POA: Diagnosis not present

## 2019-02-25 ENCOUNTER — Encounter: Payer: Medicare Other | Admitting: Student

## 2019-02-26 NOTE — Progress Notes (Signed)
EPIC Encounter for ICM Monitoring  Patient Name: [name] is a 68 y.o. male Date: 02/26/2019 Primary Care Physican: Leonard Downing, MD Primary Cardiologist:Crenshaw Electrophysiologist: Allred 10/14/2020Weight: 245 lbs   Transmission reviewed.   Optivol thoracic impedance normal.  Prescribed:Furosemide 40 mg 0.5 tablet (20 mg total) by mouth daily as needed (fluid). Klor Con20 mEq1 tablet by mouth as directed only take on days that Lasix is taken  Recommendations:  None  Follow-up plan: ICM clinic phone appointment on 04/05/2019.   91 day device clinic remote transmission 03/18/2019.  Office appt 04/22/2019 with Dr. Stanford Breed.    Copy of ICM check sent to Dr. Rayann Heman.   3 month ICM trend: 02/22/2019    1 Year ICM trend:       Rosalene Billings, RN 02/26/2019 10:37 AM

## 2019-03-03 ENCOUNTER — Ambulatory Visit (INDEPENDENT_AMBULATORY_CARE_PROVIDER_SITE_OTHER): Payer: Medicare Other

## 2019-03-03 ENCOUNTER — Other Ambulatory Visit: Payer: Self-pay

## 2019-03-03 VITALS — BP 128/70 | HR 62 | Ht 67.0 in | Wt 244.4 lb

## 2019-03-03 DIAGNOSIS — Z79899 Other long term (current) drug therapy: Secondary | ICD-10-CM | POA: Diagnosis not present

## 2019-03-03 NOTE — Progress Notes (Signed)
1.) Reason for visit: BP check after d/c of Losartan 10/14  2.) Name of MD requesting visit: Joesph July, PA-C  3.) H&P: see Epic  4.) ROS related to problem: see Epic  5.) Assessment and plan per MD:  Patient presented today for a BP check after discontinuing Losartan on 10/14 per Joesph July, PA-C. Patient states he has been feeling well and does not have any complaints. Patient's BP recordings along with todays BP were reviewed by DOD, Dr. Burt Knack, who advised that patient should continue is current medication regimen with no changes.

## 2019-03-03 NOTE — Patient Instructions (Signed)
Medication Instructions:  Continue on current medication regimen.  *If you need a refill on your cardiac medications before your next appointment, please call your pharmacy*  Lab Work: None ordered.  If you have labs (blood work) drawn today and your tests are completely normal, you will receive your results only by: Marland Kitchen MyChart Message (if you have MyChart) OR . A paper copy in the mail If you have any lab test that is abnormal or we need to change your treatment, we will call you to review the results.  Testing/Procedures: None ordered.  Follow-Up: At [facility], you and your health needs are our priority.  As part of our continuing mission to provide you with exceptional heart care, we have created designated Provider Care Teams.  These Care Teams include your primary Cardiologist (physician) and Advanced Practice Providers (APPs -  Physician Assistants and Nurse Practitioners) who all work together to provide you with the care you need, when you need it.  Your next appointment:   12 months  The format for your next appointment:   In Person  Provider:   You may see one of the following Advanced Practice Providers on your designated Care Team:    Chanetta Marshall, NP  Tommye Standard, PA-C  Legrand Como "[place]" [place], [place]   Other Instructions Blood pressure readings were reviewed today with DOD, Dr. Burt Knack. Patient here today for BP check due to Losartan stopped on 10/14 by Joesph July, PA-C. Per Dr. Burt Knack, continue current regimen. No changes to be made at this time. If any questions please call the office [phone].

## 2019-03-18 ENCOUNTER — Ambulatory Visit (INDEPENDENT_AMBULATORY_CARE_PROVIDER_SITE_OTHER): Payer: Medicare Other | Admitting: *Deleted

## 2019-03-18 DIAGNOSIS — I428 Other cardiomyopathies: Secondary | ICD-10-CM

## 2019-03-18 DIAGNOSIS — I5022 Chronic systolic (congestive) heart failure: Secondary | ICD-10-CM

## 2019-03-18 LAB — CUP PACEART REMOTE DEVICE CHECK
Battery Remaining Longevity: 55 mo
Battery Voltage: 2.98 V
Brady Statistic RV Percent Paced: 0 %
Date Time Interrogation Session: 20201112081704
HighPow Impedance: 73 Ohm
Implantable Lead Implant Date: 20141202
Implantable Lead Location: 753860
Implantable Lead Model: 6935
Implantable Pulse Generator Implant Date: 20141202
Lead Channel Impedance Value: 399 Ohm
Lead Channel Impedance Value: 456 Ohm
Lead Channel Pacing Threshold Amplitude: 0.5 V
Lead Channel Pacing Threshold Pulse Width: 0.4 ms
Lead Channel Sensing Intrinsic Amplitude: 6.125 mV
Lead Channel Sensing Intrinsic Amplitude: 6.125 mV
Lead Channel Setting Pacing Amplitude: 2 V
Lead Channel Setting Pacing Pulse Width: 0.4 ms
Lead Channel Setting Sensing Sensitivity: 0.3 mV

## 2019-03-28 ENCOUNTER — Other Ambulatory Visit: Payer: Self-pay | Admitting: Cardiology

## 2019-03-28 DIAGNOSIS — I5022 Chronic systolic (congestive) heart failure: Secondary | ICD-10-CM

## 2019-04-05 ENCOUNTER — Ambulatory Visit (INDEPENDENT_AMBULATORY_CARE_PROVIDER_SITE_OTHER): Payer: Medicare Other

## 2019-04-05 DIAGNOSIS — I5022 Chronic systolic (congestive) heart failure: Secondary | ICD-10-CM | POA: Diagnosis not present

## 2019-04-05 DIAGNOSIS — Z95810 Presence of automatic (implantable) cardiac defibrillator: Secondary | ICD-10-CM | POA: Diagnosis not present

## 2019-04-07 NOTE — Progress Notes (Signed)
EPIC Encounter for ICM Monitoring  Patient Name: Gary Brady is a 68 y.o. male Date: 04/07/2019 Primary Care Physican: Leonard Downing, MD Primary Cardiologist:Crenshaw Electrophysiologist: Allred 12/2/2020Weight:235 lbs   Spoke with patient and he is asymptomatic for fluid accumulation.  He took PRN Furosemide during decreased impedance.   Optivol thoracic impedance normal.  Prescribed:Furosemide 40 mg 0.5 tablet (20 mg total) by mouth daily as needed (fluid). Klor Con20 mEq1 tablet by mouth as directed only take on days that Lasix is taken  Recommendations: Reinforced limiting salt intake to < 2000 mg daily.  Encouraged to call if experiencing fluid symptoms.  Follow-up plan: ICM clinic phone appointment on 05/10/2019.   91 day device clinic remote transmission 06/17/2019.  Office appt 04/22/2019 with Dr. Stanford Breed.    Copy of ICM check sent to Dr. Rayann Heman.   3 month ICM trend: 04/05/2019    1 Year ICM trend:       Rosalene Billings, RN 04/07/2019 3:34 PM

## 2019-04-09 NOTE — Progress Notes (Signed)
Remote ICD transmission.   
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
## 2019-04-22 ENCOUNTER — Ambulatory Visit (INDEPENDENT_AMBULATORY_CARE_PROVIDER_SITE_OTHER): Payer: Medicare Other | Admitting: Cardiology

## 2019-04-22 ENCOUNTER — Encounter: Payer: Self-pay | Admitting: Cardiology

## 2019-04-22 ENCOUNTER — Other Ambulatory Visit: Payer: Self-pay

## 2019-04-22 VITALS — BP 118/72 | HR 79 | Ht 67.0 in | Wt 244.0 lb

## 2019-04-22 DIAGNOSIS — I2510 Atherosclerotic heart disease of native coronary artery without angina pectoris: Secondary | ICD-10-CM | POA: Diagnosis not present

## 2019-04-22 DIAGNOSIS — I5022 Chronic systolic (congestive) heart failure: Secondary | ICD-10-CM

## 2019-04-22 DIAGNOSIS — I428 Other cardiomyopathies: Secondary | ICD-10-CM | POA: Diagnosis not present

## 2019-04-22 DIAGNOSIS — I10 Essential (primary) hypertension: Secondary | ICD-10-CM

## 2019-04-22 DIAGNOSIS — E7800 Pure hypercholesterolemia, unspecified: Secondary | ICD-10-CM

## 2019-04-22 NOTE — Patient Instructions (Signed)
Medication Instructions:  NO CHANGE *If you need a refill on your cardiac medications before your next appointment, please call your pharmacy*  Lab Work: Your physician recommends that you return for lab work PRIOR TO EATING  If you have labs (blood work) drawn today and your tests are completely normal, you will receive your results only by: . MyChart Message (if you have MyChart) OR . A paper copy in the mail If you have any lab test that is abnormal or we need to change your treatment, we will call you to review the results.  Follow-Up: At [facility], you and your health needs are our priority.  As part of our continuing mission to provide you with exceptional heart care, we have created designated Provider Care Teams.  These Care Teams include your primary Cardiologist (physician) and Advanced Practice Providers (APPs -  Physician Assistants and Nurse Practitioners) who all work together to provide you with the care you need, when you need it.  Your next appointment:   12 month(s)  The format for your next appointment:   Either In Person or Virtual  Provider:   You may see Brian Crenshaw, MD or one of the following Advanced Practice Providers on your designated Care Team:    Luke Kilroy, PA-C  Callie Goodrich, PA-C  Jesse Cleaver, FNP    

## 2019-04-30 LAB — BASIC METABOLIC PANEL
BUN/Creatinine Ratio: 17 (ref 10–24)
BUN: 21 mg/dL (ref 8–27)
CO2: 18 mmol/L — ABNORMAL LOW (ref 20–29)
Calcium: 9.2 mg/dL (ref 8.6–10.2)
Chloride: 102 mmol/L (ref 96–106)
Creatinine, Ser: 1.23 mg/dL (ref 0.76–1.27)
GFR calc Af Amer: 70 mL/min/{1.73_m2} (ref >59)
GFR calc non Af Amer: 61 mL/min/{1.73_m2} (ref >59)
Glucose: 151 mg/dL — ABNORMAL HIGH (ref 65–99)
Potassium: 4.2 mmol/L (ref 3.5–5.2)
Sodium: 139 mmol/L (ref 134–144)

## 2019-04-30 LAB — LIPID PANEL
Chol/HDL Ratio: 2.8 ratio (ref 0.0–5.0)
Cholesterol, Total: 84 mg/dL — ABNORMAL LOW (ref 100–199)
HDL: 30 mg/dL — ABNORMAL LOW (ref >39)
LDL Chol Calc (NIH): 35 mg/dL (ref 0–99)
Triglycerides: 96 mg/dL (ref 0–149)
VLDL Cholesterol Cal: 19 mg/dL (ref 5–40)

## 2019-04-30 LAB — HEPATIC FUNCTION PANEL
ALT: 25 IU/L (ref 0–44)
AST: 23 IU/L (ref 0–40)
Albumin: 4.3 g/dL (ref 3.8–4.8)
Alkaline Phosphatase: 78 IU/L (ref 39–117)
Bilirubin Total: 0.8 mg/dL (ref 0.0–1.2)
Bilirubin, Direct: 0.24 mg/dL (ref 0.00–0.40)
Total Protein: 6.8 g/dL (ref 6.0–8.5)

## 2019-05-03 NOTE — Progress Notes (Signed)
Results mailed to patient;.

## 2019-05-10 ENCOUNTER — Ambulatory Visit (INDEPENDENT_AMBULATORY_CARE_PROVIDER_SITE_OTHER): Payer: Medicare Other

## 2019-05-10 DIAGNOSIS — I5022 Chronic systolic (congestive) heart failure: Secondary | ICD-10-CM

## 2019-05-10 DIAGNOSIS — Z95810 Presence of automatic (implantable) cardiac defibrillator: Secondary | ICD-10-CM | POA: Diagnosis not present

## 2019-05-11 ENCOUNTER — Telehealth: Payer: Self-pay

## 2019-05-11 NOTE — Telephone Encounter (Signed)
Remote ICM transmission received.  Attempted call to patient regarding ICM remote transmission and left detailed message per DPR.  Advised to return call for any fluid symptoms or questions. Next ICM remote transmission scheduled 05/17/2019 to recheck fluid levels.

## 2019-05-11 NOTE — Progress Notes (Signed)
EPIC Encounter for ICM Monitoring  Patient Name: CHIVAS NOTZ is a 69 y.o. male Date: 05/11/2019 Primary Care Physican: Kaleen Mask, MD Primary Cardiologist:Crenshaw Electrophysiologist: Allred 12/2/2020Weight:235 lbs   Attempted call to patient and unable to reach.  Left detailed message per DPR regarding transmission. Transmission reviewed.   Optivol thoracic impedance suggesting possible fluid accumulation since 05/02/2019.  Prescribed:Furosemide 40 mg 0.5 tablet (20 mg total) by mouth daily as needed (fluid). Klor Con20 mEq1 tablet by mouth as directed only take on days that Lasix is taken  Recommendations:  Left voice mail with ICM number and encouraged to call if experiencing any fluid symptoms.  Follow-up plan: ICM clinic phone appointment on 05/17/2019 to recheck fluid levels.   91 day device clinic remote transmission 06/17/2019.     Copy of ICM check sent to Dr. Johney Frame and Dr Jens Som.   3 month ICM trend: 05/10/2019    1 Year ICM trend:       [name], RN 05/11/2019 8:54 AM

## 2019-05-17 ENCOUNTER — Ambulatory Visit (INDEPENDENT_AMBULATORY_CARE_PROVIDER_SITE_OTHER): Payer: Medicare Other

## 2019-05-17 DIAGNOSIS — Z95810 Presence of automatic (implantable) cardiac defibrillator: Secondary | ICD-10-CM

## 2019-05-19 NOTE — Progress Notes (Signed)
EPIC Encounter for ICM Monitoring  Patient Name: Gary Brady is a 69 y.o. male Date: 05/19/2019 Primary Care Physican: Kaleen Mask, MD Primary Cardiologist:Crenshaw Electrophysiologist: Allred 1/13/2021Weight:235 lbs   Spoke with patient and denies fluid symptoms.  Optivol thoracic impedance returned to normal after taking PRN Furosemide.  Prescribed:Furosemide 40 mg 0.5 tablet (20 mg total) by mouth daily as needed (fluid). Klor Con20 mEq1 tablet by mouth as directed only take on days that Lasix is taken  Recommendations: Left voice mail with ICM number and encouraged to call if experiencing any fluid symptoms.  Follow-up plan: ICM clinic phone appointment on2/04/2020. 91 day device clinic remote transmission 06/17/2019.     Copy of ICM check sent to Dr. Johney Frame  3 month ICM trend: 05/17/2019    1 Year ICM trend:       [name], RN 05/19/2019 3:04 PM

## 2019-06-17 ENCOUNTER — Ambulatory Visit (INDEPENDENT_AMBULATORY_CARE_PROVIDER_SITE_OTHER): Payer: Medicare Other | Admitting: *Deleted

## 2019-06-17 DIAGNOSIS — I5022 Chronic systolic (congestive) heart failure: Secondary | ICD-10-CM | POA: Diagnosis not present

## 2019-06-17 LAB — CUP PACEART REMOTE DEVICE CHECK
Battery Remaining Longevity: 56 mo
Battery Voltage: 2.98 V
Brady Statistic RV Percent Paced: 0.01 %
Date Time Interrogation Session: 20210211033324
HighPow Impedance: 72 Ohm
Implantable Lead Implant Date: 20141202
Implantable Lead Location: 753860
Implantable Lead Model: 6935
Implantable Pulse Generator Implant Date: 20141202
Lead Channel Impedance Value: 380 Ohm
Lead Channel Impedance Value: 437 Ohm
Lead Channel Pacing Threshold Amplitude: 0.5 V
Lead Channel Pacing Threshold Pulse Width: 0.4 ms
Lead Channel Sensing Intrinsic Amplitude: 6.5 mV
Lead Channel Sensing Intrinsic Amplitude: 6.5 mV
Lead Channel Setting Pacing Amplitude: 2 V
Lead Channel Setting Pacing Pulse Width: 0.4 ms
Lead Channel Setting Sensing Sensitivity: 0.3 mV

## 2019-06-18 ENCOUNTER — Ambulatory Visit (INDEPENDENT_AMBULATORY_CARE_PROVIDER_SITE_OTHER): Payer: Medicare Other

## 2019-06-18 DIAGNOSIS — I5022 Chronic systolic (congestive) heart failure: Secondary | ICD-10-CM

## 2019-06-18 DIAGNOSIS — Z95810 Presence of automatic (implantable) cardiac defibrillator: Secondary | ICD-10-CM | POA: Diagnosis not present

## 2019-06-18 NOTE — Progress Notes (Signed)
EPIC Encounter for ICM Monitoring  Patient Name: Gary Brady is a 69 y.o. male Date: 06/18/2019 Primary Care Physican: Kaleen Mask, MD Primary Cardiologist:Crenshaw Electrophysiologist: Allred 1/13/2021Weight:235 lbs   Transmission Reviewed.  Optivol thoracic impedance returned to normal after taking PRN Furosemide.  Prescribed:Furosemide 40 mg 0.5 tablet (20 mg total) by mouth daily as needed (fluid). Klor Con20 mEq1 tablet by mouth as directed only take on days that Lasix is taken  Recommendations:None  Follow-up plan: ICM clinic phone appointment on 07/19/2019.   91 day device clinic remote transmission 09/16/2019.    Copy of ICM check sent to Dr. Johney Frame.   3 month ICM trend: 06/17/2019    1 Year ICM trend:       [name], RN 06/18/2019 5:10 PM

## 2019-06-18 NOTE — Progress Notes (Signed)
ICD Remote  

## 2019-06-28 ENCOUNTER — Other Ambulatory Visit: Payer: Self-pay | Admitting: Cardiology

## 2019-07-26 NOTE — Progress Notes (Signed)
No ICM remote transmission received for 07/19/2019 and next ICM transmission scheduled for 08/16/2019.   

## 2019-08-16 ENCOUNTER — Ambulatory Visit (INDEPENDENT_AMBULATORY_CARE_PROVIDER_SITE_OTHER): Payer: Medicare Other

## 2019-08-16 DIAGNOSIS — I5022 Chronic systolic (congestive) heart failure: Secondary | ICD-10-CM | POA: Diagnosis not present

## 2019-08-16 DIAGNOSIS — Z95810 Presence of automatic (implantable) cardiac defibrillator: Secondary | ICD-10-CM | POA: Diagnosis not present

## 2019-08-17 NOTE — Progress Notes (Signed)
EPIC Encounter for ICM Monitoring  Patient Name: Gary Brady is a 69 y.o. male Date: 08/17/2019 Primary Care Physican: Kaleen Mask, MD Primary Cardiologist:Crenshaw Electrophysiologist: Allred 4/13/2021Weight:235 lbs   Spoke with patient and reports feeling well at this time.  Denies fluid symptoms at this time but has taken Furosemide a few times in last couple of weeks.  Optivol thoracic impedancenormal.  Prescribed:  Furosemide 40 mg take 0.5 tablet (20 mg total) by mouth daily as needed (fluid).   Klor Con20 mEq1 tablet by mouth as directed only take on days that Lasix is taken  Recommendations:No changes and encouraged to call if experiencing any fluid symptoms.  Follow-up plan: ICM clinic phone appointment on 09/17/2019.   91 day device clinic remote transmission 09/16/2019.    Copy of ICM check sent to Dr. Johney Frame.  3 month ICM trend: 08/16/2019    1 Year ICM trend:       [name], RN 08/17/2019 9:51 AM

## 2019-09-16 ENCOUNTER — Ambulatory Visit (INDEPENDENT_AMBULATORY_CARE_PROVIDER_SITE_OTHER): Payer: Medicare Other | Admitting: *Deleted

## 2019-09-16 DIAGNOSIS — I428 Other cardiomyopathies: Secondary | ICD-10-CM

## 2019-09-16 LAB — CUP PACEART REMOTE DEVICE CHECK
Battery Remaining Longevity: 50 mo
Battery Voltage: 2.97 V
Brady Statistic RV Percent Paced: 0 %
Date Time Interrogation Session: 20210513022824
HighPow Impedance: 81 Ohm
Implantable Lead Implant Date: 20141202
Implantable Lead Location: 753860
Implantable Lead Model: 6935
Implantable Pulse Generator Implant Date: 20141202
Lead Channel Impedance Value: 456 Ohm
Lead Channel Impedance Value: 494 Ohm
Lead Channel Pacing Threshold Amplitude: 0.5 V
Lead Channel Pacing Threshold Pulse Width: 0.4 ms
Lead Channel Sensing Intrinsic Amplitude: 5.375 mV
Lead Channel Sensing Intrinsic Amplitude: 5.375 mV
Lead Channel Setting Pacing Amplitude: 2 V
Lead Channel Setting Pacing Pulse Width: 0.4 ms
Lead Channel Setting Sensing Sensitivity: 0.3 mV

## 2019-09-20 ENCOUNTER — Ambulatory Visit (INDEPENDENT_AMBULATORY_CARE_PROVIDER_SITE_OTHER): Payer: Medicare Other

## 2019-09-20 DIAGNOSIS — I5022 Chronic systolic (congestive) heart failure: Secondary | ICD-10-CM | POA: Diagnosis not present

## 2019-09-20 DIAGNOSIS — Z95810 Presence of automatic (implantable) cardiac defibrillator: Secondary | ICD-10-CM | POA: Diagnosis not present

## 2019-09-20 NOTE — Progress Notes (Signed)
Remote ICD transmission.   

## 2019-09-22 NOTE — Progress Notes (Signed)
EPIC Encounter for ICM Monitoring  Patient Name: Gary Brady is a 69 y.o. male Date: 09/22/2019 Primary Care Physican: Kaleen Mask, MD Primary Cardiologist:Crenshaw Electrophysiologist: Allred 4/13/2021Weight:235 lbs   Transmission reviewed.  Optivol thoracic impedancenormal.  Prescribed:  Furosemide 40 mg take 0.5 tablet (20 mg total) by mouth daily as needed (fluid).   Klor Con20 mEq1 tablet by mouth as directed only take on days that Lasix is taken  Spironolactone 20 mg take 0.5 tablet twice a day  Recommendations:None  Follow-up plan: ICM clinic phone appointment on6/21/2021. 91 day device clinic remote transmission 12/16/2019.   Copy of ICM check sent to Dr.Allred.  3 month ICM trend: 09/16/2019    1 Year ICM trend:       [name], RN 09/22/2019 11:07 AM

## 2019-09-27 ENCOUNTER — Other Ambulatory Visit: Payer: Self-pay | Admitting: Cardiology

## 2019-09-27 ENCOUNTER — Other Ambulatory Visit: Payer: Self-pay | Admitting: Nurse Practitioner

## 2019-10-18 ENCOUNTER — Other Ambulatory Visit: Payer: Self-pay | Admitting: Cardiology

## 2019-10-25 ENCOUNTER — Ambulatory Visit (INDEPENDENT_AMBULATORY_CARE_PROVIDER_SITE_OTHER): Payer: Medicare Other

## 2019-10-25 DIAGNOSIS — Z95810 Presence of automatic (implantable) cardiac defibrillator: Secondary | ICD-10-CM | POA: Diagnosis not present

## 2019-10-25 DIAGNOSIS — I5022 Chronic systolic (congestive) heart failure: Secondary | ICD-10-CM

## 2019-10-26 NOTE — Progress Notes (Signed)
EPIC Encounter for ICM Monitoring  Patient Name: Gary Brady is a 69 y.o. male Date: 10/26/2019 Primary Care Physican: Kaleen Mask, MD Primary Cardiologist:Crenshaw Electrophysiologist: Allred 4/13/2021Weight:235 lbs   Spoke with patient and reports feeling well at this time.  Denies fluid symptoms.   [place] the [place] and not following low salt diet and had increased fluid intake.    Optivol thoracic impedancenormal.  Prescribed:  Furosemide 40 mgtake0.5 tablet (20 mg total) by mouth daily as needed (fluid).   Klor Con20 mEq1 tablet by mouth as directed only take on days that Lasix is taken  Spironolactone 20 mg take 0.5 tablet twice a day  Recommendations:Patient will take PRN Furosemide x 2 days.  Follow-up plan: ICM clinic phone appointment on6/25/2021 (manual send) to recheck fluid levels. 91 day device clinic remote transmission 12/16/2019.   Copy of ICM check sent to Dr.Allred.  3 month ICM trend: 10/25/2019    1 Year ICM trend:       [name], RN 10/26/2019 4:55 PM

## 2019-10-29 ENCOUNTER — Ambulatory Visit (INDEPENDENT_AMBULATORY_CARE_PROVIDER_SITE_OTHER): Payer: Medicare Other

## 2019-10-29 DIAGNOSIS — I5022 Chronic systolic (congestive) heart failure: Secondary | ICD-10-CM

## 2019-10-29 DIAGNOSIS — Z95810 Presence of automatic (implantable) cardiac defibrillator: Secondary | ICD-10-CM

## 2019-10-29 NOTE — Progress Notes (Signed)
EPIC Encounter for ICM Monitoring  Patient Name: Gary Brady is a 69 y.o. male Date: 10/29/2019 Primary Care Physican: Kaleen Mask, MD Primary Cardiologist:Crenshaw Electrophysiologist: Allred 4/13/2021Weight:235 lbs  Attempted call to patient and unable to reach.  Left detailed message per DPR regarding transmission. Transmission reviewed.   Optivol thoracic impedancereturned to normal.  Prescribed:  Furosemide 40 mgtake0.5 tablet (20 mg total) by mouth daily as needed (fluid).   Klor Con20 mEq1 tablet by mouth as directed only take on days that Lasix is taken  Spironolactone 20 mg take 0.5 tablet twice a day  Recommendations:Left voice mail with ICM number and encouraged to call if experiencing any fluid symptoms.  Follow-up plan: ICM clinic phone appointment on7/26/2021. 91 day device clinic remote transmission8/04/2020.   Copy of ICM check sent to Dr.Allred.  3 month ICM trend: 10/29/2019    1 Year ICM trend:       [name], RN 10/29/2019 4:15 PM

## 2019-11-29 ENCOUNTER — Ambulatory Visit (INDEPENDENT_AMBULATORY_CARE_PROVIDER_SITE_OTHER): Payer: Medicare Other

## 2019-11-29 DIAGNOSIS — I5022 Chronic systolic (congestive) heart failure: Secondary | ICD-10-CM | POA: Diagnosis not present

## 2019-11-29 DIAGNOSIS — Z95810 Presence of automatic (implantable) cardiac defibrillator: Secondary | ICD-10-CM

## 2019-11-29 NOTE — Progress Notes (Signed)
EPIC Encounter for ICM Monitoring  Patient Name: Gary Brady is a 69 y.o. male Date: 11/29/2019 Primary Care Physican: Kaleen Mask, MD Primary Cardiologist:Crenshaw Electrophysiologist: Allred 4/13/2021Weight:235 lbs  Attempted call to patient and unable to reach.  Left detailed message per DPR regarding transmission. Transmission reviewed.   Optivol thoracic impedancesuggesting possible fluid accumulation since 11/22/2019.  Prescribed:  Furosemide 40 mgtake0.5 tablet (20 mg total) by mouth daily as needed (fluid).   Klor Con20 mEq1 tablet by mouth as directed only take on days that Lasix is taken  Spironolactone 20 mg take 0.5 tablet twice a day  Recommendations: Left voice mail with ICM number and encouraged to call if experiencing any fluid symptoms.  Follow-up plan: ICM clinic phone appointment on 12/03/2019 to recheck fluid levels.  91 day device clinic remote transmission 12/16/2019.    EP/Cardiology Office Visits: Recalls for 02/17/2020 with Otilio Saber, PA and 04/21/2020 with Dr Jens Som.    Copy of ICM check sent to Dr. Johney Frame and Dr Jens Som.   3 month ICM trend: 11/29/2019    1 Year ICM trend:       [name], RN 11/29/2019 1:40 PM

## 2019-12-07 NOTE — Progress Notes (Signed)
No ICM remote transmission received for 12/03/2019 and next ICM transmission scheduled for 12/16/2019.

## 2019-12-15 ENCOUNTER — Telehealth: Payer: Self-pay | Admitting: Cardiology

## 2019-12-15 NOTE — Telephone Encounter (Signed)
Patient is requesting to speak with Stanton Kidney to discuss [employer] assistance.

## 2019-12-15 NOTE — Telephone Encounter (Signed)
Spoke with pt, he is going to drop off his patient assistance form for [employer].

## 2019-12-16 ENCOUNTER — Ambulatory Visit (INDEPENDENT_AMBULATORY_CARE_PROVIDER_SITE_OTHER): Payer: Medicare Other | Admitting: *Deleted

## 2019-12-16 DIAGNOSIS — I5022 Chronic systolic (congestive) heart failure: Secondary | ICD-10-CM | POA: Diagnosis not present

## 2019-12-16 LAB — CUP PACEART REMOTE DEVICE CHECK
Battery Remaining Longevity: 43 mo
Battery Voltage: 2.97 V
Brady Statistic RV Percent Paced: 0.01 %
Date Time Interrogation Session: 20210812044224
HighPow Impedance: 80 Ohm
Implantable Lead Implant Date: 20141202
Implantable Lead Location: 753860
Implantable Lead Model: 6935
Implantable Pulse Generator Implant Date: 20141202
Lead Channel Impedance Value: 437 Ohm
Lead Channel Impedance Value: 494 Ohm
Lead Channel Pacing Threshold Amplitude: 0.5 V
Lead Channel Pacing Threshold Pulse Width: 0.4 ms
Lead Channel Sensing Intrinsic Amplitude: 7.75 mV
Lead Channel Sensing Intrinsic Amplitude: 7.75 mV
Lead Channel Setting Pacing Amplitude: 2 V
Lead Channel Setting Pacing Pulse Width: 0.4 ms
Lead Channel Setting Sensing Sensitivity: 0.3 mV

## 2019-12-17 ENCOUNTER — Ambulatory Visit (INDEPENDENT_AMBULATORY_CARE_PROVIDER_SITE_OTHER): Payer: Medicare Other

## 2019-12-17 DIAGNOSIS — I5022 Chronic systolic (congestive) heart failure: Secondary | ICD-10-CM

## 2019-12-17 DIAGNOSIS — Z95810 Presence of automatic (implantable) cardiac defibrillator: Secondary | ICD-10-CM

## 2019-12-17 NOTE — Progress Notes (Signed)
EPIC Encounter for ICM Monitoring  Patient Name: Gary Brady is a 69 y.o. male Date: 12/17/2019 Primary Care Physican: Kaleen Mask, MD Primary Cardiologist:Crenshaw Electrophysiologist: Allred Last MEQAST:419 lbs  Transmission reviewed.  Optivol thoracic impedancereturned close to baseline normal.  Prescribed:  Furosemide 40 mgtake0.5 tablet (20 mg total) by mouth daily as needed (fluid).   Klor Con20 mEq1 tablet by mouth as directed only take on days that Lasix is taken  Spironolactone 20 mg take 0.5 tablet twice a day  Recommendations: No changes  Follow-up plan: ICM clinic phone appointment on 01/03/2020.  91 day device clinic remote transmission 12/16/2019.    EP/Cardiology Office Visits: 02/18/2020 with Otilio Saber, PA and 05/08/2020 with Dr Jens Som.    Copy of ICM check sent to Dr. Johney Frame   3 month ICM trend: 12/16/2019    1 Year ICM trend:       [name], RN 12/17/2019 4:58 PM

## 2019-12-20 NOTE — Progress Notes (Signed)
Remote ICD transmission.   

## 2019-12-31 ENCOUNTER — Other Ambulatory Visit: Payer: Self-pay | Admitting: Cardiology

## 2019-12-31 DIAGNOSIS — I5022 Chronic systolic (congestive) heart failure: Secondary | ICD-10-CM

## 2020-01-03 ENCOUNTER — Ambulatory Visit (INDEPENDENT_AMBULATORY_CARE_PROVIDER_SITE_OTHER): Payer: Medicare Other

## 2020-01-03 DIAGNOSIS — I5022 Chronic systolic (congestive) heart failure: Secondary | ICD-10-CM

## 2020-01-03 DIAGNOSIS — Z95810 Presence of automatic (implantable) cardiac defibrillator: Secondary | ICD-10-CM | POA: Diagnosis not present

## 2020-01-05 NOTE — Progress Notes (Signed)
EPIC Encounter for ICM Monitoring  Patient Name: Gary Brady is a 69 y.o. male Date: 01/05/2020 Primary Care Physican: Kaleen Mask, MD Primary Cardiologist:Crenshaw Electrophysiologist: Allred Last Weight:235 lbs  Spoke with patient and reports feeling well at this time.  Denies fluid symptoms.    Optivol thoracic impedancesuggesting normal fluid levels.  Prescribed:  Furosemide 40 mgtake0.5 tablet (20 mg total) by mouth daily as needed (fluid).   Klor Con20 mEq1 tablet by mouth as directed only take on days that Lasix is taken  Spironolactone 20 mg take 0.5 tablet twice a day  Recommendations:No changes and encouraged to call if experiencing any fluid symptoms.  Follow-up plan: ICM clinic phone appointment on10/08/2019. 91 day device clinic remote transmission 03/16/2020.   EP/Cardiology Office Visits:02/18/2020 Marlyne Beards 05/08/2020 with Dr Jens Som.   Copy of ICM check sent to Dr.Allred    3 month ICM trend: 01/03/2020    1 Year ICM trend:       [name], RN 01/05/2020 10:11 AM

## 2020-02-07 ENCOUNTER — Ambulatory Visit (INDEPENDENT_AMBULATORY_CARE_PROVIDER_SITE_OTHER): Payer: Medicare Other

## 2020-02-07 DIAGNOSIS — I5022 Chronic systolic (congestive) heart failure: Secondary | ICD-10-CM | POA: Diagnosis not present

## 2020-02-07 DIAGNOSIS — Z95810 Presence of automatic (implantable) cardiac defibrillator: Secondary | ICD-10-CM

## 2020-02-10 NOTE — Progress Notes (Signed)
EPIC Encounter for ICM Monitoring  Patient Name: Gary Brady is a 69 y.o. male Date: 02/10/2020 Primary Care Physican: Kaleen Mask, MD Primary Cardiologist:Crenshaw Electrophysiologist: Allred LastWeight:235 lbs  Transmission reviewed.  Optivol thoracic impedancesuggesting normal fluid levels.  Prescribed:  Furosemide 40 mgtake0.5 tablet (20 mg total) by mouth daily as needed (fluid).   Klor Con20 mEq1 tablet by mouth as directed only take on days that Lasix is taken  Spironolactone 20 mg take 0.5 tablet twice a day  Recommendations:No changes.  Follow-up plan: ICM clinic phone appointment on11/04/2020. 91 day device clinic remote transmission 03/16/2020.   EP/Cardiology Office Visits:02/18/2020 withAndy Tillery,PAand1/3/2022with Dr Jens Som.   Copy of ICM check sent to Dr.Allred    3 month ICM trend: 02/07/2020    1 Year ICM trend:       [name], RN 02/10/2020 3:53 PM

## 2020-02-18 ENCOUNTER — Ambulatory Visit (INDEPENDENT_AMBULATORY_CARE_PROVIDER_SITE_OTHER): Payer: Medicare Other | Admitting: Student

## 2020-02-18 ENCOUNTER — Encounter: Payer: Self-pay | Admitting: Student

## 2020-02-18 ENCOUNTER — Other Ambulatory Visit: Payer: Self-pay

## 2020-02-18 VITALS — BP 136/86 | HR 97 | Ht 67.0 in | Wt 240.2 lb

## 2020-02-18 DIAGNOSIS — I10 Essential (primary) hypertension: Secondary | ICD-10-CM

## 2020-02-18 DIAGNOSIS — I5022 Chronic systolic (congestive) heart failure: Secondary | ICD-10-CM | POA: Diagnosis not present

## 2020-02-18 DIAGNOSIS — I2510 Atherosclerotic heart disease of native coronary artery without angina pectoris: Secondary | ICD-10-CM

## 2020-02-18 LAB — CUP PACEART INCLINIC DEVICE CHECK
Battery Remaining Longevity: 37 mo
Battery Voltage: 2.97 V
Brady Statistic RV Percent Paced: 0.01 %
Date Time Interrogation Session: 20211015081213
HighPow Impedance: 78 Ohm
Implantable Lead Implant Date: 20141202
Implantable Lead Location: 753860
Implantable Lead Model: 6935
Implantable Pulse Generator Implant Date: 20141202
Lead Channel Impedance Value: 494 Ohm
Lead Channel Impedance Value: 532 Ohm
Lead Channel Pacing Threshold Amplitude: 0.5 V
Lead Channel Pacing Threshold Pulse Width: 0.4 ms
Lead Channel Sensing Intrinsic Amplitude: 7 mV
Lead Channel Sensing Intrinsic Amplitude: 7 mV
Lead Channel Setting Pacing Amplitude: 2 V
Lead Channel Setting Pacing Pulse Width: 0.4 ms
Lead Channel Setting Sensing Sensitivity: 0.3 mV
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
## 2020-02-18 NOTE — Patient Instructions (Addendum)
Medication Instructions:  Your physician recommends that you continue on your current medications as directed. Please refer to the Current Medication list given to you today. *If you need a refill on your cardiac medications before your next appointment, please call your pharmacy*  Lab Work: None ordered. If you have labs (blood work) drawn today and your tests are completely normal, you will receive your results only by: Marland Kitchen MyChart Message (if you have MyChart) OR . A paper copy in the mail If you have any lab test that is abnormal or we need to change your treatment, we will call you to review the results.  Testing/Procedures: None ordered.  Follow-Up: At [facility], you and your health needs are our priority.  As part of our continuing mission to provide you with exceptional heart care, we have created designated Provider Care Teams.  These Care Teams include your primary Cardiologist (physician) and Advanced Practice Providers (APPs -  Physician Assistants and Nurse Practitioners) who all work together to provide you with the care you need, when you need it.  We recommend signing up for the patient portal called "MyChart".  Sign up information is provided on this After Visit Summary.  MyChart is used to connect with patients for Virtual Visits (Telemedicine).  Patients are able to view lab/test results, encounter notes, upcoming appointments, etc.  Non-urgent messages can be sent to your provider as well.   To learn more about what you can do with MyChart, go to [email or web address].    Your next appointment:   Your physician wants you to follow-up in: one year with Dr. Melanee Spry Tillery/Renee [place]a [place]. You will receive a reminder letter in the mail two months in advance. If you don't receive a letter, please call our office to schedule the follow-up appointment.  Remote monitoring is used to monitor your ICD from home. This monitoring reduces the number of office visits required to  check your device to one time per year. It allows [place] to keep an eye on the functioning of your device to ensure it is working properly. You are scheduled for a device check from home on 03/16/2020. You may send your transmission at any time that day. If you have a wireless device, the transmission will be sent automatically. After your physician reviews your transmission, you will receive a postcard with your next transmission date.

## 2020-03-15 ENCOUNTER — Telehealth: Payer: Self-pay | Admitting: Cardiology

## 2020-03-15 NOTE — Telephone Encounter (Signed)
Patient said he had some forms that need filled out for his FARXIGA 10 MG TABS tablet. He is in a patient assistance program and will be coming up for renewal next month. There is a form that Dr. Jens Som needs to fill out so he can get approval. He will be faxing the form to Dr. Jens Som today or tomorrow. Please let the patient know what he needs to do with the forms after Dr. Jens Som fills out his part

## 2020-03-15 NOTE — Telephone Encounter (Signed)
Spoke to patient he stated he faxed a patient assistance form for Farxiga to Dr.Crenshaw to complete.When completed he would like form faxed back to him or mailed.Advised I will make Dr.Crenshaw's RN aware.

## 2020-03-16 ENCOUNTER — Ambulatory Visit (INDEPENDENT_AMBULATORY_CARE_PROVIDER_SITE_OTHER): Payer: Medicare Other

## 2020-03-16 DIAGNOSIS — I428 Other cardiomyopathies: Secondary | ICD-10-CM

## 2020-03-16 DIAGNOSIS — I5022 Chronic systolic (congestive) heart failure: Secondary | ICD-10-CM

## 2020-03-16 LAB — CUP PACEART REMOTE DEVICE CHECK
Battery Remaining Longevity: 41 mo
Battery Voltage: 2.97 V
Brady Statistic RV Percent Paced: 0.01 %
Date Time Interrogation Session: 20211111043724
HighPow Impedance: 75 Ohm
Implantable Lead Implant Date: 20141202
Implantable Lead Location: 753860
Implantable Lead Model: 6935
Implantable Pulse Generator Implant Date: 20141202
Lead Channel Impedance Value: 456 Ohm
Lead Channel Impedance Value: 513 Ohm
Lead Channel Pacing Threshold Amplitude: 0.375 V
Lead Channel Pacing Threshold Pulse Width: 0.4 ms
Lead Channel Sensing Intrinsic Amplitude: 6.125 mV
Lead Channel Sensing Intrinsic Amplitude: 6.125 mV
Lead Channel Setting Pacing Amplitude: 2 V
Lead Channel Setting Pacing Pulse Width: 0.4 ms
Lead Channel Setting Sensing Sensitivity: 0.3 mV

## 2020-03-16 NOTE — Telephone Encounter (Signed)
Left message for patient, assistance form for entresto received, signed and faxed to the number provided.

## 2020-03-17 ENCOUNTER — Ambulatory Visit (INDEPENDENT_AMBULATORY_CARE_PROVIDER_SITE_OTHER): Payer: Medicare Other

## 2020-03-17 DIAGNOSIS — I5022 Chronic systolic (congestive) heart failure: Secondary | ICD-10-CM

## 2020-03-17 DIAGNOSIS — Z95810 Presence of automatic (implantable) cardiac defibrillator: Secondary | ICD-10-CM | POA: Diagnosis not present

## 2020-03-17 NOTE — Progress Notes (Signed)
EPIC Encounter for ICM Monitoring  Patient Name: Gary Brady is a 69 y.o. male Date: 03/17/2020 Primary Care Physican: Kaleen Mask, MD Primary Cardiologist:Crenshaw Electrophysiologist: Allred LastWeight:235 lbs  Transmission reviewed.  Optivol thoracic impedancesuggestingnormalfluid levels.  Prescribed:  Furosemide 40 mgtake0.5 tablet (20 mg total) by mouth daily as needed (fluid).   Klor Con20 mEq1 tablet by mouth as directed only take on days that Lasix is taken  Spironolactone 20 mg take 0.5 tablet twice a day  Recommendations:No changes.  Follow-up plan: ICM clinic phone appointment on12/13/2021. 91 day device clinic remote transmission2/02/2021.   EP/Cardiology Office Visits:1/3/2022with Dr Jens Som.   Copy of ICM check sent to Dr.Allred   3 month ICM trend: 03/16/2020    1 Year ICM trend:       [name], RN 03/17/2020 4:16 PM

## 2020-03-17 NOTE — Progress Notes (Signed)
Remote pacemaker transmission.   

## 2020-04-17 ENCOUNTER — Ambulatory Visit (INDEPENDENT_AMBULATORY_CARE_PROVIDER_SITE_OTHER): Payer: Medicare Other

## 2020-04-17 DIAGNOSIS — Z95810 Presence of automatic (implantable) cardiac defibrillator: Secondary | ICD-10-CM

## 2020-04-17 DIAGNOSIS — I5022 Chronic systolic (congestive) heart failure: Secondary | ICD-10-CM

## 2020-04-19 ENCOUNTER — Telehealth: Payer: Self-pay

## 2020-04-19 NOTE — Progress Notes (Signed)
EPIC Encounter for ICM Monitoring  Patient Name: Gary Brady is a 69 y.o. male Date: 04/19/2020 Primary Care Physican: Kaleen Mask, MD Primary Cardiologist:Crenshaw Electrophysiologist: Allred LastWeight:235 lbs  Attempted call to patient and unable to reach.  Left detailed message per DPR regarding transmission. Transmission reviewed.   Optivol thoracic impedancesuggestingnormalfluid levels.  Prescribed:  Furosemide 40 mgtake0.5 tablet (20 mg total) by mouth daily as needed (fluid).   Klor Con20 mEq1 tablet by mouth as directed only take on days that Lasix is taken  Spironolactone 20 mg take 0.5 tablet twice a day  Recommendations:Left voice mail with ICM number and encouraged to call if experiencing any fluid symptoms.  Follow-up plan: ICM clinic phone appointment on1/17/2022. 91 day device clinic remote transmission2/02/2021.   EP/Cardiology Office Visits:1/3/2022with Dr Jens Som.   Copy of ICM check sent to Dr.Allred  3 month ICM trend: 04/17/2020    1 Year ICM trend:       [name], RN 04/19/2020 10:39 AM

## 2020-04-19 NOTE — Telephone Encounter (Signed)
Remote ICM transmission received.  Attempted call to patient regarding ICM remote transmission and left detailed message per DPR.  Advised to return call for any fluid symptoms or questions. Next ICM remote transmission scheduled 03/12/2021.    

## 2020-04-20 ENCOUNTER — Other Ambulatory Visit: Payer: Self-pay | Admitting: Cardiovascular Disease
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
## 2020-05-08 ENCOUNTER — Encounter: Payer: Self-pay | Admitting: Cardiology

## 2020-05-08 ENCOUNTER — Other Ambulatory Visit: Payer: Self-pay

## 2020-05-08 ENCOUNTER — Encounter: Payer: Self-pay | Admitting: *Deleted

## 2020-05-08 ENCOUNTER — Ambulatory Visit (INDEPENDENT_AMBULATORY_CARE_PROVIDER_SITE_OTHER): Payer: Medicare Other | Admitting: Cardiology

## 2020-05-08 VITALS — BP 96/68 | HR 76 | Ht 67.0 in | Wt 239.0 lb

## 2020-05-08 DIAGNOSIS — I2510 Atherosclerotic heart disease of native coronary artery without angina pectoris: Secondary | ICD-10-CM

## 2020-05-08 DIAGNOSIS — I10 Essential (primary) hypertension: Secondary | ICD-10-CM | POA: Diagnosis not present

## 2020-05-08 DIAGNOSIS — I428 Other cardiomyopathies: Secondary | ICD-10-CM

## 2020-05-08 DIAGNOSIS — I5022 Chronic systolic (congestive) heart failure: Secondary | ICD-10-CM

## 2020-05-08 DIAGNOSIS — E7800 Pure hypercholesterolemia, unspecified: Secondary | ICD-10-CM

## 2020-05-08 LAB — BASIC METABOLIC PANEL
BUN/Creatinine Ratio: 14 (ref 10–24)
BUN: 19 mg/dL (ref 8–27)
CO2: 24 mmol/L (ref 20–29)
Calcium: 9.3 mg/dL (ref 8.6–10.2)
Chloride: 100 mmol/L (ref 96–106)
Creatinine, Ser: 1.33 mg/dL — ABNORMAL HIGH (ref 0.76–1.27)
GFR calc Af Amer: 63 mL/min/{1.73_m2} (ref >59)
GFR calc non Af Amer: 55 mL/min/{1.73_m2} — ABNORMAL LOW (ref >59)
Glucose: 147 mg/dL — ABNORMAL HIGH (ref 65–99)
Potassium: 4.4 mmol/L (ref 3.5–5.2)
Sodium: 138 mmol/L (ref 134–144)

## 2020-05-08 NOTE — Patient Instructions (Signed)
Medication Instructions:   NO CHANGE  *If you need a refill on your cardiac medications before your next appointment, please call your pharmacy*   Lab Work:  Your physician recommends that you HAVE LAB WORK TODAY  If you have labs (blood work) drawn today and your tests are completely normal, you will receive your results only by: . MyChart Message (if you have MyChart) OR . A paper copy in the mail If you have any lab test that is abnormal or we need to change your treatment, we will call you to review the results.   Follow-Up: At [facility], you and your health needs are our priority.  As part of our continuing mission to provide you with exceptional heart care, we have created designated Provider Care Teams.  These Care Teams include your primary Cardiologist (physician) and Advanced Practice Providers (APPs -  Physician Assistants and Nurse Practitioners) who all work together to provide you with the care you need, when you need it.  We recommend signing up for the patient portal called "MyChart".  Sign up information is provided on this After Visit Summary.  MyChart is used to connect with patients for Virtual Visits (Telemedicine).  Patients are able to view lab/test results, encounter notes, upcoming appointments, etc.  Non-urgent messages can be sent to your provider as well.   To learn more about what you can do with MyChart, go to [email or web address].    Your next appointment:   12 month(s)  The format for your next appointment:   In Person  Provider:   Brian Crenshaw, MD     

## 2020-05-22 ENCOUNTER — Ambulatory Visit (INDEPENDENT_AMBULATORY_CARE_PROVIDER_SITE_OTHER): Payer: Medicare Other

## 2020-05-22 DIAGNOSIS — Z95810 Presence of automatic (implantable) cardiac defibrillator: Secondary | ICD-10-CM

## 2020-05-22 DIAGNOSIS — I5022 Chronic systolic (congestive) heart failure: Secondary | ICD-10-CM

## 2020-05-22 NOTE — Progress Notes (Signed)
EPIC Encounter for ICM Monitoring  Patient Name: Gary Brady is a 70 y.o. male Date: 05/22/2020 Primary Care Physican: Kaleen Mask, MD Primary Cardiologist:Crenshaw Electrophysiologist: Allred LastWeight:235 lbs  Transmission reviewed.   Optivol thoracic impedancesuggestingnormalfluid levels.  Prescribed:  Furosemide 40 mgtake0.5 tablet (20 mg total) by mouth daily as needed (fluid).   Klor Con20 mEq1 tablet by mouth as directed only take on days that Lasix is taken  Spironolactone 20 mg take 0.5 tablet twice a day  Recommendations:No changes  Follow-up plan: ICM clinic phone appointment on2/21/2022. 91 day device clinic remote transmission2/02/2021.   EP/Cardiology Office Visits:Recall 02/12/2021 with Dr Johney Frame.  Recall12/292022 with Dr Jens Som.   Copy of ICM check sent to Dr.Allred   3 month ICM trend: 05/22/2020.    1 Year ICM trend:       [name], RN 05/22/2020 2:15 PM

## 2020-06-15 ENCOUNTER — Ambulatory Visit (INDEPENDENT_AMBULATORY_CARE_PROVIDER_SITE_OTHER): Payer: Medicare Other

## 2020-06-15 DIAGNOSIS — I5022 Chronic systolic (congestive) heart failure: Secondary | ICD-10-CM | POA: Diagnosis not present

## 2020-06-15 DIAGNOSIS — I428 Other cardiomyopathies: Secondary | ICD-10-CM

## 2020-06-15 LAB — CUP PACEART REMOTE DEVICE CHECK
Battery Remaining Longevity: 38 mo
Battery Voltage: 2.97 V
Brady Statistic RV Percent Paced: 0.01 %
Date Time Interrogation Session: 20220210012304
HighPow Impedance: 82 Ohm
Implantable Lead Implant Date: 20141202
Implantable Lead Location: 753860
Implantable Lead Model: 6935
Implantable Pulse Generator Implant Date: 20141202
Lead Channel Impedance Value: 456 Ohm
Lead Channel Impedance Value: 513 Ohm
Lead Channel Pacing Threshold Amplitude: 0.5 V
Lead Channel Pacing Threshold Pulse Width: 0.4 ms
Lead Channel Sensing Intrinsic Amplitude: 6 mV
Lead Channel Sensing Intrinsic Amplitude: 6 mV
Lead Channel Setting Pacing Amplitude: 2 V
Lead Channel Setting Pacing Pulse Width: 0.4 ms
Lead Channel Setting Sensing Sensitivity: 0.3 mV

## 2020-06-21 NOTE — Progress Notes (Signed)
Remote ICD transmission.   

## 2020-06-26 ENCOUNTER — Ambulatory Visit (INDEPENDENT_AMBULATORY_CARE_PROVIDER_SITE_OTHER): Payer: Medicare Other

## 2020-06-26 DIAGNOSIS — I5022 Chronic systolic (congestive) heart failure: Secondary | ICD-10-CM

## 2020-06-26 DIAGNOSIS — Z95810 Presence of automatic (implantable) cardiac defibrillator: Secondary | ICD-10-CM | POA: Diagnosis not present

## 2020-07-01 ENCOUNTER — Other Ambulatory Visit: Payer: Self-pay | Admitting: Nurse Practitioner

## 2020-07-01 ENCOUNTER — Other Ambulatory Visit: Payer: Self-pay | Admitting: Cardiology

## 2020-07-05 NOTE — Progress Notes (Signed)
EPIC Encounter for ICM Monitoring  Patient Name: Gary Brady is a 70 y.o. male Date: 07/05/2020 Primary Care Physican: Kaleen Mask, MD Primary Cardiologist:Crenshaw Electrophysiologist: Allred LastWeight:235 lbs  Transmission reviewed.  Optivol thoracic impedancesuggestingnormalfluid levels.  Prescribed:  Furosemide 40 mgtake0.5 tablet (20 mg total) by mouth daily as needed (fluid).   Klor Con20 mEq1 tablet by mouth as directed only take on days that Lasix is taken  Spironolactone 20 mg take 0.5 tablet twice a day  Recommendations:No changes  Follow-up plan: ICM clinic phone appointment on4/08/2020. 91 day device clinic remote transmission5/04/2021.   EP/Cardiology Office Visits:Recall 02/12/2021 with Dr Johney Frame.  Recall12/29/2022with Dr Jens Som.   Copy of ICM check sent to Dr.Allred   3 month ICM trend: 06/26/2020.    1 Year ICM trend:       [name], RN 07/05/2020 4:20 PM

## 2020-07-22 ENCOUNTER — Other Ambulatory Visit: Payer: Self-pay | Admitting: Cardiology

## 2020-08-07 ENCOUNTER — Ambulatory Visit (INDEPENDENT_AMBULATORY_CARE_PROVIDER_SITE_OTHER): Payer: Medicare Other

## 2020-08-07 DIAGNOSIS — Z95810 Presence of automatic (implantable) cardiac defibrillator: Secondary | ICD-10-CM

## 2020-08-07 DIAGNOSIS — I5022 Chronic systolic (congestive) heart failure: Secondary | ICD-10-CM | POA: Diagnosis not present

## 2020-08-09 NOTE — Progress Notes (Signed)
EPIC Encounter for ICM Monitoring  Patient Name: Gary Brady is a 70 y.o. male Date: 08/09/2020 Primary Care Physican: Kaleen Mask, MD Primary Cardiologist:Crenshaw Electrophysiologist: Allred 08/09/2020 Weight:235 lbs  Spoke with patient and reports feeling well at this time.  Denies fluid symptoms.    Optivol thoracic impedancesuggestingnormalfluid levels.  Prescribed:  Furosemide 40 mgtake0.5 tablet (20 mg total) by mouth daily as needed (fluid).   Klor Con20 mEq1 tablet by mouth as directed only take on days that Lasix is taken  Spironolactone 20 mg take 0.5 tablet twice a day  Recommendations:No changes and encouraged to call if experiencing any fluid symptoms.  Follow-up plan: ICM clinic phone appointment on5/09/2020. 91 day device clinic remote transmission5/04/2021.   EP/Cardiology Office Visits:Recall 02/12/2021 with Dr Johney Frame. Recall12/29/2022with Dr Jens Som.   Copy of ICM check sent to Dr.Allred.   3 month ICM trend: 08/07/2020.    1 Year ICM trend:       [name], RN 08/09/2020 1:34 PM

## 2020-09-07 ENCOUNTER — Ambulatory Visit (INDEPENDENT_AMBULATORY_CARE_PROVIDER_SITE_OTHER): Payer: Medicare Other

## 2020-09-07 DIAGNOSIS — I5022 Chronic systolic (congestive) heart failure: Secondary | ICD-10-CM

## 2020-09-07 DIAGNOSIS — Z95810 Presence of automatic (implantable) cardiac defibrillator: Secondary | ICD-10-CM | POA: Diagnosis not present

## 2020-09-08 NOTE — Progress Notes (Signed)
EPIC Encounter for ICM Monitoring  Patient Name: Gary Brady is a 70 y.o. male Date: 09/08/2020 Primary Care Physican: Kaleen Mask, MD Primary Cardiologist:Crenshaw Electrophysiologist: Allred 08/09/2020 Weight:235 lbs 09/06/2020 Weight: 233 lb  Spoke with patient and reports weight gain of 6-7 lbs.  Highest weight was 241 lbs but dropped 8 lbs after taking 2 days of PRN Furosemide.  Optivol thoracic impedancesuggestingnormalfluid levels.  Prescribed:  Furosemide 40 mgtake0.5 tablet (20 mg total) by mouth daily as needed (fluid).   Klor Con20 mEq1 tablet by mouth as directed only take on days that Lasix is taken  [place] 10 mg take 1 tablet daily   Spironolactone 20 mg take 0.5 tablet twice a day  Recommendations:No changes and encouraged to call if experiencing any fluid symptoms.  Follow-up plan: ICM clinic phone appointment on6/13/2022. 91 day device clinic remote transmission5/04/2021.   EP/Cardiology Office Visits:Recall 02/12/2021 with Dr Johney Frame. Recall12/29/2022with Dr Jens Som.   Copy of ICM check sent to Dr.Allred.   3 month ICM trend: 09/07/2020.    1 Year ICM trend:       Gary Soda, RN 09/08/2020 12:43 PM

## 2020-09-14 ENCOUNTER — Ambulatory Visit (INDEPENDENT_AMBULATORY_CARE_PROVIDER_SITE_OTHER): Payer: Medicare Other

## 2020-09-14 DIAGNOSIS — I5022 Chronic systolic (congestive) heart failure: Secondary | ICD-10-CM

## 2020-09-14 DIAGNOSIS — I428 Other cardiomyopathies: Secondary | ICD-10-CM | POA: Diagnosis not present

## 2020-09-14 LAB — CUP PACEART REMOTE DEVICE CHECK
Battery Remaining Longevity: 37 mo
Battery Voltage: 2.96 V
Brady Statistic RV Percent Paced: 0.01 %
Date Time Interrogation Session: 20220512033325
HighPow Impedance: 71 Ohm
Implantable Lead Implant Date: 20141202
Implantable Lead Location: 753860
Implantable Lead Model: 6935
Implantable Pulse Generator Implant Date: 20141202
Lead Channel Impedance Value: 437 Ohm
Lead Channel Impedance Value: 494 Ohm
Lead Channel Pacing Threshold Amplitude: 0.5 V
Lead Channel Pacing Threshold Pulse Width: 0.4 ms
Lead Channel Sensing Intrinsic Amplitude: 6.25 mV
Lead Channel Sensing Intrinsic Amplitude: 6.25 mV
Lead Channel Setting Pacing Amplitude: 2 V
Lead Channel Setting Pacing Pulse Width: 0.4 ms
Lead Channel Setting Sensing Sensitivity: 0.3 mV

## 2020-10-06 NOTE — Progress Notes (Signed)
Remote ICD transmission.   

## 2020-10-16 ENCOUNTER — Ambulatory Visit (INDEPENDENT_AMBULATORY_CARE_PROVIDER_SITE_OTHER): Payer: Medicare Other

## 2020-10-16 DIAGNOSIS — I5022 Chronic systolic (congestive) heart failure: Secondary | ICD-10-CM

## 2020-10-16 DIAGNOSIS — Z95810 Presence of automatic (implantable) cardiac defibrillator: Secondary | ICD-10-CM | POA: Diagnosis not present

## 2020-10-17 NOTE — Progress Notes (Signed)
EPIC Encounter for ICM Monitoring  Patient Name: Gary Brady is a 70 y.o. male Date: 10/17/2020 Primary Care Physican: Kaleen Mask, MD Primary Cardiologist: Jens Som Electrophysiologist: Allred 09/06/2020 Weight: 233 lb   Spoke with patient and reports feeling well at this time. Heart failure questions reviewed. Pt asymptomatic.   Optivol thoracic impedance suggesting normal fluid levels.   Prescribed:  Furosemide 40 mg take 0.5 tablet (20 mg total) by mouth daily as needed (fluid). Klor Con 20 mEq 1 tablet by mouth as directed only take on days that Lasix is taken Farxiga 10 mg take 1 tablet daily  Spironolactone 20 mg take 0.5 tablet twice a day   Recommendations: No changes and encouraged to call if experiencing any fluid symptoms.   Follow-up plan: ICM clinic phone appointment on 11/20/2020.  91 day device clinic remote transmission 12/14/2020.     EP/Cardiology Office Visits: Recall 02/12/2021 with Dr Johney Frame.  Recall 05/03/2021 with Dr Jens Som.     Copy of ICM check sent to Dr. Johney Frame.   3 month ICM trend: 10/16/2020.    1 Year ICM trend:       [name], RN 10/17/2020 4:20 PM

## 2020-11-20 ENCOUNTER — Ambulatory Visit (INDEPENDENT_AMBULATORY_CARE_PROVIDER_SITE_OTHER): Payer: Medicare Other

## 2020-11-20 DIAGNOSIS — I5022 Chronic systolic (congestive) heart failure: Secondary | ICD-10-CM | POA: Diagnosis not present

## 2020-11-20 DIAGNOSIS — Z95810 Presence of automatic (implantable) cardiac defibrillator: Secondary | ICD-10-CM
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
## 2020-12-05 ENCOUNTER — Ambulatory Visit (INDEPENDENT_AMBULATORY_CARE_PROVIDER_SITE_OTHER): Payer: Medicare Other | Admitting: Podiatry

## 2020-12-05 ENCOUNTER — Encounter: Payer: Self-pay | Admitting: Podiatry

## 2020-12-05 ENCOUNTER — Other Ambulatory Visit: Payer: Self-pay

## 2020-12-05 DIAGNOSIS — E1142 Type 2 diabetes mellitus with diabetic polyneuropathy: Secondary | ICD-10-CM

## 2020-12-05 DIAGNOSIS — L600 Ingrowing nail: Secondary | ICD-10-CM

## 2020-12-05 DIAGNOSIS — I2510 Atherosclerotic heart disease of native coronary artery without angina pectoris: Secondary | ICD-10-CM | POA: Diagnosis not present

## 2020-12-05 MED ORDER — NEOMYCIN-POLYMYXIN-HC 1 % OT SOLN: OTIC | 1 refills | Status: DC
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
## 2020-12-05 NOTE — Patient Instructions (Signed)

## 2020-12-14 ENCOUNTER — Ambulatory Visit (INDEPENDENT_AMBULATORY_CARE_PROVIDER_SITE_OTHER): Payer: Medicare Other

## 2020-12-14 DIAGNOSIS — I428 Other cardiomyopathies: Secondary | ICD-10-CM

## 2020-12-14 LAB — CUP PACEART REMOTE DEVICE CHECK
Battery Remaining Longevity: 34 mo
Battery Voltage: 2.96 V
Brady Statistic RV Percent Paced: 0.01 %
Date Time Interrogation Session: 20220811033624
HighPow Impedance: 78 Ohm
Implantable Lead Implant Date: 20141202
Implantable Lead Location: 753860
Implantable Lead Model: 6935
Implantable Pulse Generator Implant Date: 20141202
Lead Channel Impedance Value: 456 Ohm
Lead Channel Impedance Value: 532 Ohm
Lead Channel Pacing Threshold Amplitude: 0.375 V
Lead Channel Pacing Threshold Pulse Width: 0.4 ms
Lead Channel Sensing Intrinsic Amplitude: 8.875 mV
Lead Channel Sensing Intrinsic Amplitude: 8.875 mV
Lead Channel Setting Pacing Amplitude: 2 V
Lead Channel Setting Pacing Pulse Width: 0.4 ms
Lead Channel Setting Sensing Sensitivity: 0.3 mV

## 2020-12-21 ENCOUNTER — Other Ambulatory Visit: Payer: Self-pay

## 2020-12-21 ENCOUNTER — Encounter: Payer: Self-pay | Admitting: Podiatry

## 2020-12-21 ENCOUNTER — Ambulatory Visit (INDEPENDENT_AMBULATORY_CARE_PROVIDER_SITE_OTHER): Payer: Medicare Other | Admitting: Podiatry

## 2020-12-21 DIAGNOSIS — L600 Ingrowing nail: Secondary | ICD-10-CM

## 2020-12-21 NOTE — Progress Notes (Signed)
Subjective:   Patient ID: Gary Brady, male   DOB: 70 y.o.   MRN: [id number]   HPI Patient states he is doing fine with some crusted tissue but overall minimal discomfort   ROS      Objective:  Physical Exam  Neurovascular status intact left hallux nail site healing well crusting over no active drainage noted     Assessment:  Doing well post nail removal permanent left     Plan:  Advised on bandage usage when he is out continuing to soak open toed shoes and will be seen back as needed

## 2020-12-25 ENCOUNTER — Ambulatory Visit (INDEPENDENT_AMBULATORY_CARE_PROVIDER_SITE_OTHER): Payer: Medicare Other

## 2020-12-25 DIAGNOSIS — I5022 Chronic systolic (congestive) heart failure: Secondary | ICD-10-CM | POA: Diagnosis not present

## 2020-12-25 DIAGNOSIS — Z95810 Presence of automatic (implantable) cardiac defibrillator: Secondary | ICD-10-CM

## 2020-12-29 NOTE — Progress Notes (Signed)
EPIC Encounter for ICM Monitoring  Patient Name: PAYAM GRIBBLE is a 70 y.o. male Date: 12/29/2020 Primary Care Physican: Kaleen Mask, MD Primary Cardiologist: Jens Som Electrophysiologist: Allred 11/24/2020 Weight: 224 lb 12/29/2020 Weight: 227 lbs   Spoke with patient and heart failure questions reviewed.  Pt asymptomatic now but did have weight gain during decreased impedance.  Optivol thoracic impedance suggesting normal fluid levels.     Prescribed:  Furosemide 40 mg take 0.5 tablet (20 mg total) by mouth daily as needed (fluid). Klor Con 20 mEq 1 tablet by mouth as directed only take on days that Lasix is taken Farxiga 10 mg take 1 tablet daily  Spironolactone 20 mg take 0.5 tablet twice a day   Recommendations:  No changes and encouraged to call if experiencing any fluid symptoms.   Follow-up plan: ICM clinic phone appointment on 02/05/2021.  91 day device clinic remote transmission 03/15/2021.     EP/Cardiology Office Visits: Recall 02/12/2021 with Dr Johney Frame.  Recall 05/03/2021 with Dr Jens Som.     Copy of ICM check sent to Dr. Johney Frame.   3 month ICM trend: 12/25/2020.    1 Year ICM trend:       [name], RN 12/29/2020 3:26 PM

## 2021-01-05 NOTE — Progress Notes (Signed)
Remote ICD transmission.   

## 2021-02-05 ENCOUNTER — Ambulatory Visit (INDEPENDENT_AMBULATORY_CARE_PROVIDER_SITE_OTHER): Payer: Medicare Other

## 2021-02-05 DIAGNOSIS — I5022 Chronic systolic (congestive) heart failure: Secondary | ICD-10-CM

## 2021-02-05 DIAGNOSIS — Z95810 Presence of automatic (implantable) cardiac defibrillator: Secondary | ICD-10-CM

## 2021-02-07 ENCOUNTER — Telehealth: Payer: Self-pay

## 2021-02-07 NOTE — Progress Notes (Signed)
EPIC Encounter for ICM Monitoring  Patient Name: Gary Brady is a 70 y.o. male Date: 02/07/2021 Primary Care Physican: Kaleen Mask, MD Primary Cardiologist: Jens Som Electrophysiologist: Allred 11/24/2020 Weight: 224 lb 12/29/2020 Weight: 227 lbs   Attempted call to patient and unable to reach.  Left message to return call with wife. Transmission reviewed.    Optivol thoracic impedance suggesting normal fluid levels.     Prescribed:  Furosemide 40 mg take 0.5 tablet (20 mg total) by mouth daily as needed (fluid). Klor Con 20 mEq 1 tablet by mouth as directed only take on days that Lasix is taken Farxiga 10 mg take 1 tablet daily  Spironolactone 20 mg take 0.5 tablet twice a day   Recommendations:  Provided ICM number to wife and patient will return call.   Follow-up plan: ICM clinic phone appointment on 03/12/2021.  91 day device clinic remote transmission 03/15/2021.     EP/Cardiology Office Visits: Recall 02/12/2021 with Dr Johney Frame.  Recall 05/03/2021 with Dr Jens Som.     Copy of ICM check sent to Dr. Johney Frame.   3 month ICM trend: 02/05/2021.    1 Year ICM trend:       [name], RN 02/07/2021 11:49 AM

## 2021-02-07 NOTE — Progress Notes (Signed)
Spoke with patient and heart failure questions reviewed.  Pt has gained a couple of pounds since 10/3 remote transmission and took PRN Furosemide today with good urine output.  He has been very busy lately.  Recommendation to limit salt intake  and fluid intake daily.  Encouraged to call if experiencing any fluid symptoms. No changes and encouraged to call if experiencing any fluid symptoms.

## 2021-02-07 NOTE — Telephone Encounter (Signed)
Remote ICM transmission received.  Attempted call to patient regarding ICM remote transmission and left message with wife for patient to return call.

## 2021-03-12 ENCOUNTER — Ambulatory Visit (INDEPENDENT_AMBULATORY_CARE_PROVIDER_SITE_OTHER): Payer: Medicare Other

## 2021-03-12 ENCOUNTER — Other Ambulatory Visit: Payer: Self-pay

## 2021-03-12 DIAGNOSIS — I5022 Chronic systolic (congestive) heart failure: Secondary | ICD-10-CM | POA: Diagnosis not present

## 2021-03-12 DIAGNOSIS — Z95810 Presence of automatic (implantable) cardiac defibrillator: Secondary | ICD-10-CM | POA: Diagnosis not present

## 2021-03-12 MED ORDER — ENTRESTO 24-26 MG PO TABS: 1.0000 | ORAL_TABLET | Freq: Two times a day (BID) | ORAL | 2 refills | Status: DC

## 2021-03-13 NOTE — Progress Notes (Signed)
EPIC Encounter for ICM Monitoring  Patient Name: Gary Brady is a 70 y.o. male Date: 03/13/2021 Primary Care Physican: Kaleen Mask, MD Primary Cardiologist: Jens Som Electrophysiologist: Allred 12/29/2020 Weight: 227 lbs   Spoke with patient and heart failure questions reviewed.  Pt asymptomatic for fluid accumulation and feeling well.     Optivol thoracic impedance suggesting possible fluid accumulation since 10/18.     Prescribed:  Furosemide 40 mg take 0.5 tablet (20 mg total) by mouth daily as needed (fluid). Klor Con 20 mEq 1 tablet by mouth as directed only take on days that Lasix is taken Farxiga 10 mg take 1 tablet daily  Spironolactone 20 mg take 0.5 tablet twice a day   Recommendations:  Advised to take PRN Furosemide 2-3 days and will recheck fluid levels.   Follow-up plan: ICM clinic phone appointment on 03/16/2021 to recheck fluid levels.  91 day device clinic remote transmission 03/15/2021.     EP/Cardiology Office Visits: Recall 02/12/2021 with Dr Johney Frame.  Recall 05/03/2021 with Dr Jens Som.     Copy of ICM check sent to Dr. Johney Frame.    3 month ICM trend: 03/12/2021.    1 Year ICM trend:       [name], RN 03/13/2021 10:46 AM

## 2021-03-15 ENCOUNTER — Ambulatory Visit (INDEPENDENT_AMBULATORY_CARE_PROVIDER_SITE_OTHER): Payer: Medicare Other

## 2021-03-15 DIAGNOSIS — I428 Other cardiomyopathies: Secondary | ICD-10-CM

## 2021-03-15 LAB — CUP PACEART REMOTE DEVICE CHECK
Battery Remaining Longevity: 32 mo
Battery Voltage: 2.95 V
Brady Statistic RV Percent Paced: 0.01 %
Date Time Interrogation Session: 20221110022707
HighPow Impedance: 86 Ohm
Implantable Lead Implant Date: 20141202
Implantable Lead Location: 753860
Implantable Lead Model: 6935
Implantable Pulse Generator Implant Date: 20141202
Lead Channel Impedance Value: 494 Ohm
Lead Channel Impedance Value: 532 Ohm
Lead Channel Pacing Threshold Amplitude: 0.5 V
Lead Channel Pacing Threshold Pulse Width: 0.4 ms
Lead Channel Sensing Intrinsic Amplitude: 6.5 mV
Lead Channel Sensing Intrinsic Amplitude: 6.5 mV
Lead Channel Setting Pacing Amplitude: 2 V
Lead Channel Setting Pacing Pulse Width: 0.4 ms
Lead Channel Setting Sensing Sensitivity: 0.3 mV

## 2021-03-16 ENCOUNTER — Ambulatory Visit (INDEPENDENT_AMBULATORY_CARE_PROVIDER_SITE_OTHER): Payer: Medicare Other

## 2021-03-16 DIAGNOSIS — Z95810 Presence of automatic (implantable) cardiac defibrillator: Secondary | ICD-10-CM

## 2021-03-16 DIAGNOSIS — I5022 Chronic systolic (congestive) heart failure: Secondary | ICD-10-CM

## 2021-03-16 NOTE — Progress Notes (Signed)
EPIC Encounter for ICM Monitoring  Patient Name: Gary Brady is a 70 y.o. male Date: 03/16/2021 Primary Care Physican: Kaleen Mask, MD Primary Cardiologist: Jens Som Electrophysiologist: Allred 03/16/2021 Weight: 228 lbs   Spoke with patient and heart failure questions reviewed.  Pt asymptomatic for fluid accumulation and feeling well.  He is taking a cruise in a couple of weeks.   Optivol thoracic impedance suggesting fluid levels returned to normal after taking PRN Furoemide x 2 days.     Prescribed:  Furosemide 40 mg take 0.5 tablet (20 mg total) by mouth daily as needed (fluid). Klor Con 20 mEq 1 tablet by mouth as directed only take on days that Lasix is taken Farxiga 10 mg take 1 tablet daily  Spironolactone 20 mg take 0.5 tablet twice a day   Recommendations:  No changes and encouraged to call if experiencing any fluid symptoms.   Follow-up plan: ICM clinic phone appointment on 04/16/2021.  91 day device clinic remote transmission 03/15/2021.     EP/Cardiology Office Visits: Recall 02/12/2021 with Dr Johney Frame.  Recall 05/03/2021 with Dr Jens Som.     Copy of ICM check sent to Dr. Johney Frame.  3 month ICM trend: 03/16/2021.    1 Year ICM trend:       [name], RN 03/16/2021 4:31 PM

## 2021-03-19 ENCOUNTER — Telehealth: Payer: Self-pay | Admitting: Cardiology

## 2021-03-19 NOTE — Telephone Encounter (Signed)
Routed to primary nurse to follow up on fax.

## 2021-03-19 NOTE — Telephone Encounter (Signed)
Left message for patient, we need to renew his patient assistance for the year 2023. He was given several options regarding how we can do that.

## 2021-03-19 NOTE — Telephone Encounter (Signed)
Follow Up:     Patient is calling to see if you received a fax from [employer]. This fax was about him getting help with his Entresto.

## 2021-03-22 NOTE — Telephone Encounter (Signed)
Spoke with pt, aware provider portion of application has been faxed.

## 2021-03-23 NOTE — Progress Notes (Signed)
Remote ICD transmission.   

## 2021-04-09 ENCOUNTER — Other Ambulatory Visit: Payer: Self-pay | Admitting: Internal Medicine

## 2021-04-10 ENCOUNTER — Other Ambulatory Visit: Payer: Self-pay | Admitting: *Deleted

## 2021-04-10 MED ORDER — ENTRESTO 24-26 MG PO TABS: 1.0000 | ORAL_TABLET | Freq: Two times a day (BID) | ORAL | 3 refills | Status: AC

## 2021-04-12 ENCOUNTER — Telehealth: Payer: Self-pay | Admitting: Cardiology

## 2021-04-12 NOTE — Telephone Encounter (Signed)
New message:     Patient said he was told by [employer], that he  needs a Stand A lone prescription for Entresto.    *STAT* If patient is at the pharmacy, call can be transferred to refill team.   1. Which medications need to be refilled? (please list name of each medication and dose if known)  a  prescription  2. Which pharmacy/location (including street and city if local pharmacy) is medication to be sent to? [facility]- Fax number  [phone]  3. Do they need a 30 day or 90 day supply? 90 days and refills

## 2021-04-13 NOTE — Telephone Encounter (Signed)
Spoke with pt. Pt stated that the pt assistance company needed a script for his Entresto. Pt gave a number to check into this ([phone]). Spoke with rep Glynda Jaeger, they received a prescription from our office as it was previously faxed by our office by nurse. Pt assistance company is waiting for the RX to be processed. RX should be processed in 1-2 days.

## 2021-04-16 ENCOUNTER — Other Ambulatory Visit: Payer: Self-pay | Admitting: Internal Medicine

## 2021-04-16 ENCOUNTER — Ambulatory Visit (INDEPENDENT_AMBULATORY_CARE_PROVIDER_SITE_OTHER): Payer: Medicare Other

## 2021-04-16 DIAGNOSIS — I5022 Chronic systolic (congestive) heart failure: Secondary | ICD-10-CM | POA: Diagnosis not present

## 2021-04-16 DIAGNOSIS — Z95810 Presence of automatic (implantable) cardiac defibrillator: Secondary | ICD-10-CM

## 2021-04-24 ENCOUNTER — Telehealth: Payer: Self-pay

## 2021-04-24 NOTE — Telephone Encounter (Signed)
Remote ICM transmission received.  Attempted call to patient regarding ICM remote transmission and no answer or voice mail option.  

## 2021-04-24 NOTE — Progress Notes (Signed)
EPIC Encounter for ICM Monitoring  Patient Name: Gary Brady is a 70 y.o. male Date: 04/24/2021 Primary Care Physican: Kaleen Mask, MD Primary Cardiologist: Jens Som Electrophysiologist: Allred 03/16/2021 Weight: 228 lbs   Attempted call to patient and unable to reach. Transmission reviewed. Pt was on cruise during decreased impedance.   Optivol thoracic impedance suggesting normal fluid levels.     Prescribed:  Furosemide 40 mg take 0.5 tablet (20 mg total) by mouth daily as needed (fluid). Klor Con 20 mEq 1 tablet by mouth as directed only take on days that Lasix is taken Farxiga 10 mg take 1 tablet daily  Spironolactone 20 mg take 0.5 tablet twice a day   Recommendations:  Unable to reach.     Follow-up plan: ICM clinic phone appointment on 05/21/2021.  91 day device clinic remote transmission 06/14/2021.     EP/Cardiology Office Visits: Recall 02/12/2021 with Dr Johney Frame.  07/04/2021 with Dr Jens Som.     Copy of ICM check sent to Dr. Johney Frame.  3 month ICM trend: 04/16/2021.    12-14 Month ICM trend:       [name], RN 04/24/2021 7:59 AM

## 2021-05-04 ENCOUNTER — Other Ambulatory Visit: Payer: Self-pay | Admitting: Internal Medicine

## 2021-05-15 NOTE — Telephone Encounter (Signed)
Patient has been approved for entresto patient assistance until 05/05/22. °

## 2021-05-21 ENCOUNTER — Ambulatory Visit (INDEPENDENT_AMBULATORY_CARE_PROVIDER_SITE_OTHER): Payer: Medicare Other

## 2021-05-21 DIAGNOSIS — Z95810 Presence of automatic (implantable) cardiac defibrillator: Secondary | ICD-10-CM

## 2021-05-21 DIAGNOSIS — I5022 Chronic systolic (congestive) heart failure: Secondary | ICD-10-CM | POA: Diagnosis not present

## 2021-05-24 NOTE — Progress Notes (Signed)
EPIC Encounter for ICM Monitoring  Patient Name: Gary Brady is a 71 y.o. male Date: 05/24/2021 Primary Care Physican: Kaleen Mask, MD Primary Cardiologist: Jens Som Electrophysiologist: Allred 05/24/2021 Weight: 228 lbs    Spoke with patient and heart failure questions reviewed.  Pt asymptomatic for fluid accumulation.  Reports feeling well at this time and voices no complaints.   Optivol thoracic impedance suggesting normal fluid levels.     Prescribed:  Furosemide 40 mg take 0.5 tablet (20 mg total) by mouth daily as needed (fluid). Klor Con 20 mEq 1 tablet by mouth as directed only take on days that Lasix is taken Farxiga 10 mg take 1 tablet daily  Spironolactone 20 mg take 0.5 tablet twice a day   Recommendations:  No changes and encouraged to call if experiencing any fluid symptoms.   Follow-up plan: ICM clinic phone appointment on 06/25/2021.  91 day device clinic remote transmission 06/14/2021.     EP/Cardiology Office Visits:  Message sent to EP scheduler to contact patient for appointment.  Recall 02/12/2021 with Dr Johney Frame.  07/04/2021 with Dr Jens Som.     Copy of ICM check sent to Dr. Johney Frame.  3 month ICM trend: 05/21/2021.    12-14 Month ICM trend:     [name], RN 05/24/2021 12:42 PM
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
## 2021-06-11 ENCOUNTER — Other Ambulatory Visit: Payer: Self-pay | Admitting: Internal Medicine

## 2021-06-13 ENCOUNTER — Other Ambulatory Visit: Payer: Self-pay

## 2021-06-13 ENCOUNTER — Ambulatory Visit (INDEPENDENT_AMBULATORY_CARE_PROVIDER_SITE_OTHER): Payer: Medicare Other | Admitting: Physician Assistant

## 2021-06-13 ENCOUNTER — Encounter: Payer: Self-pay | Admitting: Physician Assistant

## 2021-06-13 VITALS — BP 96/76 | HR 77 | Ht 67.0 in | Wt 242.0 lb

## 2021-06-13 DIAGNOSIS — I2510 Atherosclerotic heart disease of native coronary artery without angina pectoris: Secondary | ICD-10-CM

## 2021-06-13 DIAGNOSIS — I428 Other cardiomyopathies: Secondary | ICD-10-CM

## 2021-06-13 DIAGNOSIS — Z79899 Other long term (current) drug therapy: Secondary | ICD-10-CM

## 2021-06-13 DIAGNOSIS — I5022 Chronic systolic (congestive) heart failure: Secondary | ICD-10-CM

## 2021-06-13 DIAGNOSIS — Z95810 Presence of automatic (implantable) cardiac defibrillator: Secondary | ICD-10-CM | POA: Diagnosis not present

## 2021-06-13 LAB — CUP PACEART INCLINIC DEVICE CHECK
Battery Remaining Longevity: 32 mo
Battery Voltage: 2.94 V
Brady Statistic RV Percent Paced: 0.01 %
Date Time Interrogation Session: 20230208184437
HighPow Impedance: 76 Ohm
Implantable Lead Implant Date: 20141202
Implantable Lead Location: 753860
Implantable Lead Model: 6935
Implantable Pulse Generator Implant Date: 20141202
Lead Channel Impedance Value: 456 Ohm
Lead Channel Impedance Value: 513 Ohm
Lead Channel Pacing Threshold Amplitude: 0.375 V
Lead Channel Pacing Threshold Pulse Width: 0.4 ms
Lead Channel Sensing Intrinsic Amplitude: 7.5 mV
Lead Channel Sensing Intrinsic Amplitude: 8.375 mV
Lead Channel Setting Pacing Amplitude: 2 V
Lead Channel Setting Pacing Pulse Width: 0.4 ms
Lead Channel Setting Sensing Sensitivity: 0.3 mV

## 2021-06-13 LAB — BASIC METABOLIC PANEL
BUN/Creatinine Ratio: 18 (ref 10–24)
BUN: 22 mg/dL (ref 8–27)
CO2: 24 mmol/L (ref 20–29)
Calcium: 10.1 mg/dL (ref 8.6–10.2)
Chloride: 104 mmol/L (ref 96–106)
Creatinine, Ser: 1.21 mg/dL (ref 0.76–1.27)
Glucose: 142 mg/dL — ABNORMAL HIGH (ref 70–99)
Potassium: 5.4 mmol/L — ABNORMAL HIGH (ref 3.5–5.2)
Sodium: 140 mmol/L (ref 134–144)
eGFR: 65 mL/min/{1.73_m2} (ref >59)

## 2021-06-13 MED ORDER — FUROSEMIDE 40 MG PO TABS: 40.0000 mg | ORAL_TABLET | Freq: Every day | ORAL | 1 refills | Status: DC

## 2021-06-13 NOTE — Patient Instructions (Addendum)
Medication Instructions:    FOR THREE DAYS ONLY:   TAKE FUROSEMIDE 40 MG TWICE A DAY THEN GO BACK TO  FUROSEMIDE 40 ONCE A DAY    MAKE SURE YOU TAKING FUROSEMIDE 40 MG ONCE A DAY    *If you need a refill on your cardiac medications before your next appointment, please call your pharmacy*   Lab Work: BMET TODAY    If you have labs (blood work) drawn today and your tests are completely normal, you will receive your results only by: MyChart Message (if you have MyChart) OR A paper copy in the mail If you have any lab test that is abnormal or we need to change your treatment, we will call you to review the results.   Testing/Procedures: NONE ORDERED  TODAY     Follow-Up: At [facility], you and your health needs are our priority.  As part of our continuing mission to provide you with exceptional heart care, we have created designated Provider Care Teams.  These Care Teams include your primary Cardiologist (physician) and Advanced Practice Providers (APPs -  Physician Assistants and Nurse Practitioners) who all work together to provide you with the care you need, when you need it.  We recommend signing up for the patient portal called "MyChart".  Sign up information is provided on this After Visit Summary.  MyChart is used to connect with patients for Virtual Visits (Telemedicine).  Patients are able to view lab/test results, encounter notes, upcoming appointments, etc.  Non-urgent messages can be sent to your provider as well.   To learn more about what you can do with MyChart, go to [email or web address].    Your next appointment:   1 year(s)  The format for your next appointment:   In Person  Provider:   Hillis Range, MD{     Other Instructions  Low-Sodium Eating Plan Sodium, which is an element that makes up salt, helps you maintain a healthy balance of fluids in your body. Too much sodium can increase your blood pressure and cause fluid and waste to be held in  your body. Your health care provider or dietitian may recommend following this plan if you have high blood pressure (hypertension), kidney disease, liver disease, or heart failure. Eating less sodium can help lower your blood pressure, reduce swelling, and protect your heart, liver, and kidneys. What are tips for following this plan? Reading food labels The Nutrition Facts label lists the amount of sodium in one serving of the food. If you eat more than one serving, you must multiply the listed amount of sodium by the number of servings. Choose foods with less than 140 mg of sodium per serving. Avoid foods with 300 mg of sodium or more per serving. Shopping  Look for lower-sodium products, often labeled as "low-sodium" or "no salt added." Always check the sodium content, even if foods are labeled as "unsalted" or "no salt added." Buy fresh foods. Avoid canned foods and pre-made or frozen meals. Avoid canned, cured, or processed meats. Buy breads that have less than 80 mg of sodium per slice. Cooking  Eat more home-cooked food and less restaurant, buffet, and fast food. Avoid adding salt when cooking. Use salt-free seasonings or herbs instead of table salt or sea salt. Check with your health care provider or pharmacist before using salt substitutes. Cook with plant-based oils, such as canola, sunflower, or olive oil. Meal planning When eating at a restaurant, ask that your food be prepared with less salt or no  salt, if possible. Avoid dishes labeled as brined, pickled, cured, smoked, or made with soy sauce, miso, or teriyaki sauce. Avoid foods that contain MSG (monosodium glutamate). MSG is sometimes added to [place] food, bouillon, and some canned foods. Make meals that can be grilled, baked, poached, roasted, or steamed. These are generally made with less sodium. General information Most people on this plan should limit their sodium intake to 1,500-2,000 mg (milligrams) of sodium each  day. What foods should I eat? Fruits Fresh, frozen, or canned fruit. Fruit juice. Vegetables Fresh or frozen vegetables. "No salt added" canned vegetables. "No salt added" tomato sauce and paste. Low-sodium or reduced-sodium tomato and vegetable juice. Grains Low-sodium cereals, including oats, puffed wheat and rice, and shredded wheat. Low-sodium crackers. Unsalted rice. Unsalted pasta. Low-sodium bread. Whole-grain breads and whole-grain pasta. Meats and other proteins Fresh or frozen (no salt added) meat, poultry, seafood, and fish. Low-sodium canned tuna and salmon. Unsalted nuts. Dried peas, beans, and lentils without added salt. Unsalted canned beans. Eggs. Unsalted nut butters. Dairy Milk. Soy milk. Cheese that is naturally low in sodium, such as ricotta cheese, fresh mozzarella, or Swiss cheese. Low-sodium or reduced-sodium cheese. Cream cheese. Yogurt. Seasonings and condiments Fresh and dried herbs and spices. Salt-free seasonings. Low-sodium mustard and ketchup. Sodium-free salad dressing. Sodium-free light mayonnaise. Fresh or refrigerated horseradish. Lemon juice. Vinegar. Other foods Homemade, reduced-sodium, or low-sodium soups. Unsalted popcorn and pretzels. Low-salt or salt-free chips. The items listed above may not be a complete list of foods and beverages you can eat. Contact a dietitian for more information. What foods should I avoid? Vegetables Sauerkraut, pickled vegetables, and relishes. Olives. [place] fries. Onion rings. Regular canned vegetables (not low-sodium or reduced-sodium). Regular canned tomato sauce and paste (not low-sodium or reduced-sodium). Regular tomato and vegetable juice (not low-sodium or reduced-sodium). Frozen vegetables in sauces. Grains Instant hot cereals. Bread stuffing, pancake, and biscuit mixes. Croutons. Seasoned rice or pasta mixes. Noodle soup cups. Boxed or frozen macaroni and cheese. Regular salted crackers. Self-rising flour. Meats and  other proteins Meat or fish that is salted, canned, smoked, spiced, or pickled. Precooked or cured meat, such as sausages or meat loaves. Gary Brady. Ham. Pepperoni. Hot dogs. Corned beef. Chipped beef. Salt pork. Jerky. Pickled herring. Anchovies and sardines. Regular canned tuna. Salted nuts. Dairy Processed cheese and cheese spreads. Hard cheeses. Cheese curds. Blue cheese. Feta cheese. String cheese. Regular cottage cheese. Buttermilk. Canned milk. Fats and oils Salted butter. Regular margarine. Ghee. Bacon fat. Seasonings and condiments Onion salt, garlic salt, seasoned salt, table salt, and sea salt. Canned and packaged gravies. Worcestershire sauce. Tartar sauce. Barbecue sauce. Teriyaki sauce. Soy sauce, including reduced-sodium. Steak sauce. Fish sauce. Oyster sauce. Cocktail sauce. Horseradish that you find on the shelf. Regular ketchup and mustard. Meat flavorings and tenderizers. Bouillon cubes. Hot sauce. Pre-made or packaged marinades. Pre-made or packaged taco seasonings. Relishes. Regular salad dressings. Salsa. Other foods Salted popcorn and pretzels. Corn chips and puffs. Potato and tortilla chips. Canned or dried soups. Pizza. Frozen entrees and pot pies. The items listed above may not be a complete list of foods and beverages you should avoid. Contact a dietitian for more information. Summary Eating less sodium can help lower your blood pressure, reduce swelling, and protect your heart, liver, and kidneys. Most people on this plan should limit their sodium intake to 1,500-2,000 mg (milligrams) of sodium each day. Canned, boxed, and frozen foods are high in sodium. Restaurant foods, fast foods, and pizza are also very high  in sodium. You also get sodium by adding salt to food. Try to cook at home, eat more fresh fruits and vegetables, and eat less fast food and canned, processed, or prepared foods. This information is not intended to replace advice given to you by your health care  provider. Make sure you discuss any questions you have with your health care provider. Document Revised: 05/28/2019 Document Reviewed: 03/24/2019 Elsevier Patient Education  2022 [employer].

## 2021-06-14 ENCOUNTER — Other Ambulatory Visit: Payer: Self-pay | Admitting: *Deleted

## 2021-06-14 ENCOUNTER — Ambulatory Visit (INDEPENDENT_AMBULATORY_CARE_PROVIDER_SITE_OTHER): Payer: Medicare Other

## 2021-06-14 ENCOUNTER — Telehealth: Payer: Self-pay | Admitting: *Deleted

## 2021-06-14 DIAGNOSIS — I428 Other cardiomyopathies: Secondary | ICD-10-CM | POA: Diagnosis not present

## 2021-06-14 DIAGNOSIS — Z79899 Other long term (current) drug therapy: Secondary | ICD-10-CM

## 2021-06-14 LAB — CUP PACEART REMOTE DEVICE CHECK
Battery Remaining Longevity: 32 mo
Battery Voltage: 2.93 V
Brady Statistic RV Percent Paced: 0.01 %
Date Time Interrogation Session: 20230209031803
HighPow Impedance: 70 Ohm
Implantable Lead Implant Date: 20141202
Implantable Lead Location: 753860
Implantable Lead Model: 6935
Implantable Pulse Generator Implant Date: 20141202
Lead Channel Impedance Value: 437 Ohm
Lead Channel Impedance Value: 494 Ohm
Lead Channel Pacing Threshold Amplitude: 0.375 V
Lead Channel Pacing Threshold Pulse Width: 0.4 ms
Lead Channel Sensing Intrinsic Amplitude: 6.125 mV
Lead Channel Sensing Intrinsic Amplitude: 6.125 mV
Lead Channel Setting Pacing Amplitude: 2 V
Lead Channel Setting Pacing Pulse Width: 0.4 ms
Lead Channel Setting Sensing Sensitivity: 0.3 mV

## 2021-06-14 NOTE — Telephone Encounter (Signed)
Spoke with patient about results aware and verbalized understanding.. Patient scheduled for labs on 06-28-21.

## 2021-06-14 NOTE — Telephone Encounter (Signed)
-----   Message from [facility] [place], [place] sent at 06/13/2021  7:01 PM EST ----- Continue as planned to day with 3 days of extra lasix, repeat BMET as planned as well.

## 2021-06-19 NOTE — Progress Notes (Signed)
Remote ICD transmission.   

## 2021-06-28 ENCOUNTER — Other Ambulatory Visit: Payer: Medicare Other

## 2021-06-28 ENCOUNTER — Other Ambulatory Visit: Payer: Self-pay

## 2021-06-28 DIAGNOSIS — Z79899 Other long term (current) drug therapy: Secondary | ICD-10-CM

## 2021-06-28 LAB — BASIC METABOLIC PANEL
BUN/Creatinine Ratio: 23 (ref 10–24)
BUN: 39 mg/dL — ABNORMAL HIGH (ref 8–27)
CO2: 23 mmol/L (ref 20–29)
Calcium: 9.2 mg/dL (ref 8.6–10.2)
Chloride: 98 mmol/L (ref 96–106)
Creatinine, Ser: 1.68 mg/dL — ABNORMAL HIGH (ref 0.76–1.27)
Glucose: 206 mg/dL — ABNORMAL HIGH (ref 70–99)
Potassium: 4.7 mmol/L (ref 3.5–5.2)
Sodium: 135 mmol/L (ref 134–144)
eGFR: 44 mL/min/{1.73_m2} — ABNORMAL LOW (ref >59)

## 2021-06-29 ENCOUNTER — Telehealth: Payer: Self-pay | Admitting: *Deleted

## 2021-06-29 NOTE — Telephone Encounter (Signed)
-----   Message from [place], [place] sent at 06/28/2021  6:31 PM EST ----- Creat is up, likely the extra lasix Did his weight come down? Feeling OK?   Is he back in once daily lasix?  Sugar is high, needs to follow up with his PMD for DM

## 2021-06-29 NOTE — Progress Notes (Signed)
No ICM remote transmission received for 06/25/2021 and next ICM transmission scheduled for 07/16/2021.

## 2021-06-29 NOTE — Telephone Encounter (Signed)
Spoke with patient and doing well with no complaints. He currently taking Lasix  every day except Tuesday once a day. His weight has decreased some in the past 2 weeks 237 lb from 229 lb. He hasn't had any new complaints of any sort.
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
## 2021-07-04 ENCOUNTER — Other Ambulatory Visit: Payer: Self-pay

## 2021-07-04 ENCOUNTER — Telehealth: Payer: Self-pay | Admitting: *Deleted

## 2021-07-04 ENCOUNTER — Other Ambulatory Visit: Payer: Self-pay | Admitting: *Deleted

## 2021-07-04 ENCOUNTER — Encounter: Payer: Self-pay | Admitting: Cardiology

## 2021-07-04 ENCOUNTER — Ambulatory Visit (INDEPENDENT_AMBULATORY_CARE_PROVIDER_SITE_OTHER): Payer: Medicare Other | Admitting: Cardiology

## 2021-07-04 VITALS — BP 96/68 | HR 69 | Ht 67.0 in | Wt 238.8 lb

## 2021-07-04 DIAGNOSIS — Z79899 Other long term (current) drug therapy: Secondary | ICD-10-CM

## 2021-07-04 DIAGNOSIS — I2510 Atherosclerotic heart disease of native coronary artery without angina pectoris: Secondary | ICD-10-CM | POA: Diagnosis not present

## 2021-07-04 DIAGNOSIS — Z95810 Presence of automatic (implantable) cardiac defibrillator: Secondary | ICD-10-CM

## 2021-07-04 DIAGNOSIS — I5022 Chronic systolic (congestive) heart failure: Secondary | ICD-10-CM

## 2021-07-04 DIAGNOSIS — E7800 Pure hypercholesterolemia, unspecified: Secondary | ICD-10-CM

## 2021-07-04 DIAGNOSIS — I10 Essential (primary) hypertension: Secondary | ICD-10-CM

## 2021-07-04 NOTE — Patient Instructions (Signed)
?  Lab Work: ? ?Your physician recommends that you return for lab work in: ONE WEEK-FASTING ? ?If you have labs (blood work) drawn today and your tests are completely normal, you will receive your results only by: ?MyChart Message (if you have MyChart) OR ?A paper copy in the mail ?If you have any lab test that is abnormal or we need to change your treatment, we will call you to review the results. ? ? ?Follow-Up: ?At [facility], you and your health needs are our priority.  As part of our continuing mission to provide you with exceptional heart care, we have created designated Provider Care Teams.  These Care Teams include your primary Cardiologist (physician) and Advanced Practice Providers (APPs -  Physician Assistants and Nurse Practitioners) who all work together to provide you with the care you need, when you need it. ? ?We recommend signing up for the patient portal called "MyChart".  Sign up information is provided on this After Visit Summary.  MyChart is used to connect with patients for Virtual Visits (Telemedicine).  Patients are able to view lab/test results, encounter notes, upcoming appointments, etc.  Non-urgent messages can be sent to your provider as well.   ?To learn more about what you can do with MyChart, go to [email or web address].   ? ?Your next appointment:   ?6 month(s) ? ?The format for your next appointment:   ?In Person ? ?Provider:   ?Kirk Ruths, MD   ? ? ? ?

## 2021-07-04 NOTE — Telephone Encounter (Signed)
Spoke with patient aware of results and verbalized understanding Patient scheduled for redraw on 07-18-21 ?

## 2021-07-04 NOTE — Telephone Encounter (Signed)
-----   Message from [facility] sent at 07/02/2021 11:41 AM EST ----- ?Thanks repeat BMET in a week-10 days to trend his Creat please ? ?

## 2021-07-04 NOTE — Progress Notes (Signed)
Bmet 

## 2021-07-16 ENCOUNTER — Ambulatory Visit (INDEPENDENT_AMBULATORY_CARE_PROVIDER_SITE_OTHER): Payer: Medicare Other

## 2021-07-16 ENCOUNTER — Other Ambulatory Visit: Payer: Self-pay | Admitting: Cardiology

## 2021-07-16 DIAGNOSIS — Z95810 Presence of automatic (implantable) cardiac defibrillator: Secondary | ICD-10-CM

## 2021-07-16 DIAGNOSIS — I5022 Chronic systolic (congestive) heart failure: Secondary | ICD-10-CM

## 2021-07-17 NOTE — Progress Notes (Signed)
EPIC Encounter for ICM Monitoring ? ?Patient Name: Gary Brady is a 71 y.o. male ?Date: 07/17/2021 ?Primary Care Physican: Kaleen Mask, MD ?Primary Cardiologist: Jens Som ?Electrophysiologist: Allred ?05/24/2021 Weight: 228 lbs ?07/17/2021 Weight: 229 lbs ?  ?  ?Spoke with patient and heart failure questions reviewed.  Pt asymptomatic for fluid accumulation.  Reports feeling well at this time and voices no complaints.  Discussed limiting salt intake instead of taking more Lasix ?  ?Optivol thoracic impedance suggesting possible fluid accumulation starting 3/3.  ?  ?Prescribed:  ?Furosemide 40 mg take take 1 tablet daily. ?Klor Con 20 mEq 1 tablet by mouth as directed only take on days that Lasix is taken ?Farxiga 10 mg take 1 tablet daily  ?Spironolactone 20 mg take 0.5 tablet twice a day ?  ?Labs: BMET recheck scheduled for 3/15 ?06/28/2021 Creatinine 1.68, BUN 39, Potassium 4.7, Sodium 135, GFR 44 ?06/13/2021 Creatinine 1.21, BUN 22, Potassium 5.4, Sodium 140, GFR 65  ?05/08/2021 Creatinine 1.33, BUN 19, Potassium 4.4, Sodium 138, GFR 55 ?A complete set of results can be found in Results Review. ? ?Recommendations:  Advised to limit salt intake instead of adjusting diuretics since most recent creatinine was elevated and will be rechecked on 3/15 ?  ?Follow-up plan: ICM clinic phone appointment on 07/23/2021 to recheck fluid levels.  91 day device clinic remote transmission 09/13/2021.   ?  ?EP/Cardiology Office Visits:   Recall 06/08/2022 with Dr Elberta Fortis.  01/10/2022 with Dr Jens Som.   ?  ?Copy of ICM check sent to Dr. Johney Frame and Dr Jens Som for review.  ? ?3 month ICM trend: 07/17/2021. ? ? ? ?12-14 Month ICM trend:  ? ? ? ?[name], RN ?07/17/2021 ?10:37 AM ? ?

## 2021-07-18 ENCOUNTER — Other Ambulatory Visit: Payer: Self-pay

## 2021-07-18 ENCOUNTER — Other Ambulatory Visit: Payer: Medicare Other | Admitting: *Deleted

## 2021-07-18 DIAGNOSIS — Z79899 Other long term (current) drug therapy: Secondary | ICD-10-CM

## 2021-07-18 LAB — HEPATIC FUNCTION PANEL
ALT: 17 IU/L (ref 0–44)
AST: 13 IU/L (ref 0–40)
Albumin: 4.2 g/dL (ref 3.8–4.8)
Alkaline Phosphatase: 78 IU/L (ref 44–121)
Bilirubin Total: 0.6 mg/dL (ref 0.0–1.2)
Bilirubin, Direct: 0.17 mg/dL (ref 0.00–0.40)
Total Protein: 6.6 g/dL (ref 6.0–8.5)

## 2021-07-18 LAB — LIPID PANEL
Chol/HDL Ratio: 2.9 ratio (ref 0.0–5.0)
Cholesterol, Total: 90 mg/dL — ABNORMAL LOW (ref 100–199)
HDL: 31 mg/dL — ABNORMAL LOW (ref >39)
LDL Chol Calc (NIH): 34 mg/dL (ref 0–99)
Triglycerides: 141 mg/dL (ref 0–149)
VLDL Cholesterol Cal: 25 mg/dL (ref 5–40)

## 2021-07-18 LAB — BASIC METABOLIC PANEL
BUN/Creatinine Ratio: 23 (ref 10–24)
BUN: 28 mg/dL — ABNORMAL HIGH (ref 8–27)
CO2: 25 mmol/L (ref 20–29)
Calcium: 8.8 mg/dL (ref 8.6–10.2)
Chloride: 101 mmol/L (ref 96–106)
Creatinine, Ser: 1.23 mg/dL (ref 0.76–1.27)
Glucose: 173 mg/dL — ABNORMAL HIGH (ref 70–99)
Potassium: 4.6 mmol/L (ref 3.5–5.2)
Sodium: 136 mmol/L (ref 134–144)
eGFR: 64 mL/min/{1.73_m2} (ref >59)

## 2021-07-19 ENCOUNTER — Encounter: Payer: Self-pay | Admitting: *Deleted

## 2021-07-20 ENCOUNTER — Ambulatory Visit (INDEPENDENT_AMBULATORY_CARE_PROVIDER_SITE_OTHER): Payer: Medicare Other

## 2021-07-20 DIAGNOSIS — I5022 Chronic systolic (congestive) heart failure: Secondary | ICD-10-CM

## 2021-07-20 DIAGNOSIS — Z95810 Presence of automatic (implantable) cardiac defibrillator: Secondary | ICD-10-CM

## 2021-07-20 NOTE — Progress Notes (Signed)
EPIC Encounter for ICM Monitoring ? ?Patient Name: Gary Brady is a 71 y.o. male ?Date: 07/20/2021 ?Primary Care Physican: Leonard Downing, MD ?Primary Cardiologist: Stanford Breed ?Electrophysiologist: Allred ?05/24/2021 Weight: 228 lbs ?07/17/2021 Weight: 229 lbs ?07/20/2021 Weight: 235 lbs ?  ?  ?Spoke with patient and heart failure questions reviewed.  Pt's weight has increased 5 lbs this week.  No problems with breathing or lower extremity swelling.      ?  ?Optivol thoracic impedance suggesting ongoing possible fluid accumulation starting 3/3.  ?  ?Prescribed:  ?Furosemide 40 mg take take 1 tablet daily. ?Klor Con 20 mEq 1 tablet by mouth as directed only take on days that Lasix is taken ?Farxiga 10 mg take 1 tablet daily  ?Spironolactone 20 mg take 0.5 tablet twice a day ?  ?Labs:  ?07/18/2021 Creatinine 1.23, BUN 28, Potassium 4.6, Sodium 136, GFR 64 ?06/28/2021 Creatinine 1.68, BUN 39, Potassium 4.7, Sodium 135, GFR 44 ?06/13/2021 Creatinine 1.21, BUN 22, Potassium 5.4, Sodium 140, GFR 65  ?05/08/2021 Creatinine 1.33, BUN 19, Potassium 4.4, Sodium 138, GFR 55 ?A complete set of results can be found in Results Review. ?  ?Recommendations:  Copy sent to Dr Stanford Breed for review and recommendations if needed.  Advised to limit salt intake to 2000 mg daily and review food labels for sodium amounts.  ?  ?Follow-up plan: ICM clinic phone appointment on 07/23/2021 to recheck fluid levels.  91 day device clinic remote transmission 09/13/2021.   ?  ?EP/Cardiology Office Visits:   Recall 06/08/2022 with Dr Curt Bears.  01/10/2022 with Dr Stanford Breed.   ?  ?Copy of ICM check sent to Dr. Rayann Heman and Dr Stanford Breed for review.  ?  ?3 month ICM trend: 07/20/2021. ? ? ? ?12-14 Month ICM trend:  ? ? ? ?Rosalene Billings, RN ?07/20/2021 ?9:54 AM ? ?

## 2021-07-23 ENCOUNTER — Other Ambulatory Visit: Payer: Self-pay | Admitting: Cardiology

## 2021-07-23 ENCOUNTER — Telehealth: Payer: Self-pay | Admitting: Physician Assistant

## 2021-07-23 NOTE — Telephone Encounter (Signed)
Opened in error

## 2021-07-24 ENCOUNTER — Telehealth: Payer: Self-pay

## 2021-07-24 NOTE — Telephone Encounter (Signed)
Spoke with patient requested to send remote transmission to recheck fluid levels.  He will send this evening.   ?

## 2021-07-25 ENCOUNTER — Ambulatory Visit: Payer: Medicare Other

## 2021-07-25 DIAGNOSIS — Z95810 Presence of automatic (implantable) cardiac defibrillator: Secondary | ICD-10-CM

## 2021-07-25 DIAGNOSIS — I5022 Chronic systolic (congestive) heart failure: Secondary | ICD-10-CM

## 2021-07-25 NOTE — Progress Notes (Signed)
EPIC Encounter for ICM Monitoring ? ?Patient Name: Gary Brady is a 71 y.o. male ?Date: 07/25/2021 ?Primary Care Physican: Kaleen Mask, MD ?Primary Cardiologist: Jens Som ?Electrophysiologist: Allred ?05/24/2021 Weight: 228 lbs ?07/17/2021 Weight: 229 lbs ?07/20/2021 Weight: 235 lbs ?07/25/2021 Weight: 231 lbs ?  ?  ?Spoke with patient and heart failure questions reviewed.  Pt had 4 lb weight loss and feeling well.      ?  ?Optivol thoracic impedance suggesting fluid levels returned to normal without taking extra Furosemide.  ?  ?Prescribed:  ?Furosemide 40 mg take take 1 tablet daily. ?Klor Con 20 mEq 1 tablet by mouth as directed only take on days that Lasix is taken ?Farxiga 10 mg take 1 tablet daily  ?Spironolactone 20 mg take 0.5 tablet twice a day ?  ?Labs:  ?07/18/2021 Creatinine 1.23, BUN 28, Potassium 4.6, Sodium 136, GFR 64 ?06/28/2021 Creatinine 1.68, BUN 39, Potassium 4.7, Sodium 135, GFR 44 ?06/13/2021 Creatinine 1.21, BUN 22, Potassium 5.4, Sodium 140, GFR 65  ?05/08/2021 Creatinine 1.33, BUN 19, Potassium 4.4, Sodium 138, GFR 55 ?A complete set of results can be found in Results Review. ?  ?Recommendations:  No changes and encouraged to call if experiencing any fluid symptoms.  ?  ?Follow-up plan: ICM clinic phone appointment on 08/20/2021.  91 day device clinic remote transmission 09/13/2021.   ?  ?EP/Cardiology Office Visits:   Recall 06/08/2022 with Dr Elberta Fortis.  01/10/2022 with Dr Jens Som.   ?  ?Copy of ICM check sent to Dr. Johney Frame.  ? ?3 month ICM trend: 07/25/2021. ? ? ? ?12-14 Month ICM trend:  ? ? ? ?[name], RN ?07/25/2021 ?1:02 PM ? ?

## 2021-07-25 NOTE — Telephone Encounter (Signed)
Spoke with patient and advised remote transmission was not received.  He confirmed he sent transmission yesterday.  Assisted with sending remote transmission while on the phone.  See ICM note for transmission review. ?

## 2021-08-20 ENCOUNTER — Ambulatory Visit (INDEPENDENT_AMBULATORY_CARE_PROVIDER_SITE_OTHER): Payer: Medicare Other

## 2021-08-20 DIAGNOSIS — I5022 Chronic systolic (congestive) heart failure: Secondary | ICD-10-CM

## 2021-08-20 DIAGNOSIS — Z95810 Presence of automatic (implantable) cardiac defibrillator: Secondary | ICD-10-CM | POA: Diagnosis not present

## 2021-08-22 ENCOUNTER — Telehealth: Payer: Self-pay

## 2021-08-22 NOTE — Progress Notes (Signed)
EPIC Encounter for ICM Monitoring ? ?Patient Name: Gary Brady is a 71 y.o. male ?Date: 08/22/2021 ?Primary Care Physican: Kaleen Mask, MD ?Primary Cardiologist: Jens Som ?Electrophysiologist: Allred ?05/24/2021 Weight: 228 lbs ?07/17/2021 Weight: 229 lbs ?07/20/2021 Weight: 235 lbs ?07/25/2021 Weight: 231 lbs ?  ?  ?Attempted call to patient and unable to reach.  Transmission reviewed.  ?  ?Optivol thoracic impedance suggesting normal fluid levels.  ?  ?Prescribed:  ?Furosemide 40 mg take take 1 tablet daily. ?Klor Con 20 mEq 1 tablet by mouth as directed only take on days that Lasix is taken ?Farxiga 10 mg take 1 tablet daily  ?Spironolactone 20 mg take 0.5 tablet twice a day ?  ?Labs:  ?07/18/2021 Creatinine 1.23, BUN 28, Potassium 4.6, Sodium 136, GFR 64 ?06/28/2021 Creatinine 1.68, BUN 39, Potassium 4.7, Sodium 135, GFR 44 ?06/13/2021 Creatinine 1.21, BUN 22, Potassium 5.4, Sodium 140, GFR 65  ?05/08/2021 Creatinine 1.33, BUN 19, Potassium 4.4, Sodium 138, GFR 55 ?A complete set of results can be found in Results Review. ?  ?Recommendations:  Unable to reach.   ?  ?Follow-up plan: ICM clinic phone appointment on 09/24/2021.  91 day device clinic remote transmission 09/13/2021.   ?  ?EP/Cardiology Office Visits:   Recall 06/08/2022 with Dr Elberta Fortis.  01/10/2022 with Dr Jens Som.   ?  ?Copy of ICM check sent to Dr. Johney Frame.  ?  ?3 month ICM trend: 08/20/2021. ? ? ? ?12-14 Month ICM trend:  ? ? ? ?[name], RN ?08/22/2021 ?2:31 PM ? ?

## 2021-08-22 NOTE — Telephone Encounter (Signed)
Remote ICM transmission received.  Attempted call to patient regarding ICM remote transmission and wife stated he was not home.   

## 2021-09-13 ENCOUNTER — Ambulatory Visit (INDEPENDENT_AMBULATORY_CARE_PROVIDER_SITE_OTHER): Payer: Medicare Other

## 2021-09-13 DIAGNOSIS — I5022 Chronic systolic (congestive) heart failure: Secondary | ICD-10-CM

## 2021-09-13 LAB — CUP PACEART REMOTE DEVICE CHECK
Battery Remaining Longevity: 28 mo
Battery Voltage: 2.94 V
Brady Statistic RV Percent Paced: 0.01 %
Date Time Interrogation Session: 20230511063525
HighPow Impedance: 77 Ohm
Implantable Lead Implant Date: 20141202
Implantable Lead Location: 753860
Implantable Lead Model: 6935
Implantable Pulse Generator Implant Date: 20141202
Lead Channel Impedance Value: 494 Ohm
Lead Channel Impedance Value: 513 Ohm
Lead Channel Pacing Threshold Amplitude: 0.375 V
Lead Channel Pacing Threshold Pulse Width: 0.4 ms
Lead Channel Sensing Intrinsic Amplitude: 8 mV
Lead Channel Sensing Intrinsic Amplitude: 8 mV
Lead Channel Setting Pacing Amplitude: 2 V
Lead Channel Setting Pacing Pulse Width: 0.4 ms
Lead Channel Setting Sensing Sensitivity: 0.3 mV

## 2021-09-20 NOTE — Progress Notes (Signed)
Remote ICD transmission.   

## 2021-09-24 ENCOUNTER — Ambulatory Visit (INDEPENDENT_AMBULATORY_CARE_PROVIDER_SITE_OTHER): Payer: Medicare Other

## 2021-09-24 DIAGNOSIS — I5022 Chronic systolic (congestive) heart failure: Secondary | ICD-10-CM | POA: Diagnosis not present

## 2021-09-24 DIAGNOSIS — Z95810 Presence of automatic (implantable) cardiac defibrillator: Secondary | ICD-10-CM | POA: Diagnosis not present

## 2021-09-27 ENCOUNTER — Telehealth: Payer: Self-pay

## 2021-09-27 NOTE — Progress Notes (Signed)
EPIC Encounter for ICM Monitoring  Patient Name: Gary Brady is a 71 y.o. male Date: 09/27/2021 Primary Care Physican: Kaleen Mask, MD Primary Cardiologist: Jens Som Electrophysiologist: Allred 07/25/2021 Weight: 231 lbs     Attempted call to patient and unable to reach.  Transmission reviewed.    Optivol thoracic impedance suggesting normal fluid levels.    Prescribed:  Furosemide 40 mg take take 1 tablet daily. Klor Con 20 mEq 1 tablet by mouth as directed only take on days that Lasix is taken Farxiga 10 mg take 1 tablet daily  Spironolactone 20 mg take 0.5 tablet twice a day   Labs:  07/18/2021 Creatinine 1.23, BUN 28, Potassium 4.6, Sodium 136, GFR 64 06/28/2021 Creatinine 1.68, BUN 39, Potassium 4.7, Sodium 135, GFR 44 06/13/2021 Creatinine 1.21, BUN 22, Potassium 5.4, Sodium 140, GFR 65  05/08/2021 Creatinine 1.33, BUN 19, Potassium 4.4, Sodium 138, GFR 55 A complete set of results can be found in Results Review.   Recommendations:  Unable to reach.     Follow-up plan: ICM clinic phone appointment on 10/29/2021.  91 day device clinic remote transmission 12/13/2021.     EP/Cardiology Office Visits:   Recall 06/08/2022 with Dr Elberta Fortis.  01/10/2022 with Dr Jens Som.     Copy of ICM check sent to Dr. Johney Frame.   3 month ICM trend: 09/24/2021.    12-14 Month ICM trend:     [name], RN 09/27/2021 9:50 AM

## 2021-09-27 NOTE — Telephone Encounter (Signed)
Remote ICM transmission received.  Attempted call to patient regarding ICM remote transmission and no answer or voice mail option.  

## 2021-10-29 ENCOUNTER — Ambulatory Visit (INDEPENDENT_AMBULATORY_CARE_PROVIDER_SITE_OTHER): Payer: Medicare Other

## 2021-10-29 DIAGNOSIS — I5022 Chronic systolic (congestive) heart failure: Secondary | ICD-10-CM | POA: Diagnosis not present

## 2021-10-29 DIAGNOSIS — Z95810 Presence of automatic (implantable) cardiac defibrillator: Secondary | ICD-10-CM | POA: Diagnosis not present

## 2021-11-02 ENCOUNTER — Telehealth: Payer: Self-pay

## 2021-11-02 NOTE — Telephone Encounter (Signed)
Spoke with patient and requested to send remote transmission today to recheck fluid levels.  He attempted to send one at 2:30 AM and unable to successfully transmission.  He is currently traveling and not home.  Will reschedule for 7/3 and call patient to attempt to assist with transmission.

## 2021-11-05 ENCOUNTER — Telehealth: Payer: Self-pay

## 2021-11-05 NOTE — Telephone Encounter (Signed)
Spoke with patient. Attempted to assist with manual remote transmission but unsuccessful.  Provided tech support number and advised to call for assistance. Marland Kitchen

## 2021-11-08 ENCOUNTER — Telehealth: Payer: Self-pay

## 2021-11-08 NOTE — Telephone Encounter (Signed)
I called the patient about missed ICM transmission. No answer/no voicemail.

## 2021-11-09 NOTE — Progress Notes (Signed)
No ICM remote transmission received for 11/05/2021 and next ICM transmission scheduled for 12/10/2021.

## 2021-11-14 ENCOUNTER — Telehealth: Payer: Self-pay

## 2021-11-14 NOTE — Telephone Encounter (Signed)
The patient states he is receiving a new monitor.

## 2021-12-10 ENCOUNTER — Ambulatory Visit (INDEPENDENT_AMBULATORY_CARE_PROVIDER_SITE_OTHER): Payer: Medicare Other

## 2021-12-10 DIAGNOSIS — Z95810 Presence of automatic (implantable) cardiac defibrillator: Secondary | ICD-10-CM | POA: Diagnosis not present

## 2021-12-10 DIAGNOSIS — I5022 Chronic systolic (congestive) heart failure: Secondary | ICD-10-CM | POA: Diagnosis not present

## 2021-12-12 NOTE — Progress Notes (Signed)
EPIC Encounter for ICM Monitoring  Patient Name: Gary Brady is a 71 y.o. male Date: 12/12/2021 Primary Care Physican: Kaleen Mask, MD Primary Cardiologist: Jens Som Electrophysiologist: Allred 07/25/2021 Weight: 231 lbs 10/29/2021 Weight:  230 lbs 12/12/2021 Weight: 229 lbs   Spoke with patient and heart failure questions reviewed.  Pt asymptomatic for fluid accumulation.   He has some SOB but thinks that may be related to aging instead of fluid.  He took extra Lasix for last few weeks instead of a few days as recommended at last ICM call.    Optivol thoracic impedance suggesting possible fluid accumulation since 6/19.   Prescribed:  Furosemide 40 mg take take 1 tablet daily. He reports Dr Jens Som is aware he takes extra when needed. Klor Con 20 mEq 1 tablet by mouth as directed only take on days that Lasix is taken Farxiga 10 mg take 1 tablet daily  Spironolactone 20 mg take 0.5 tablet twice a day   Labs:  07/18/2021 Creatinine 1.23, BUN 28, Potassium 4.6, Sodium 136, GFR 64 06/28/2021 Creatinine 1.68, BUN 39, Potassium 4.7, Sodium 135, GFR 44 06/13/2021 Creatinine 1.21, BUN 22, Potassium 5.4, Sodium 140, GFR 65  05/08/2021 Creatinine 1.33, BUN 19, Potassium 4.4, Sodium 138, GFR 55 A complete set of results can be found in Results Review.   Recommendations:  Advised to take prescribed Lasix 40 mg daily until follow up with Dr Jens Som or new recommendations are provided.   Copy to Dr Jens Som for review since [name] continues to suggest fluid accumulation even after patient has been taking extra 20 Furosemide for past 3 weeks.  He does not take potassium even though prescription says to take when taking lasix.  Will require new script for potassium if he needs to take with Lasix.     Follow-up plan: ICM clinic phone appointment on 12/17/2021 to recheck fluid levels.  91 day device clinic remote transmission 12/13/2021.     EP/Cardiology Office Visits:   Recall 06/08/2022 with  Dr Elberta Fortis.  01/10/2022 with Dr Jens Som.     Copy of ICM check sent to Dr. Johney Frame   3 month ICM trend: 12/10/2021.    12-14 Month ICM trend:     [name], RN 12/12/2021 1:28 PM

## 2021-12-13 ENCOUNTER — Ambulatory Visit (INDEPENDENT_AMBULATORY_CARE_PROVIDER_SITE_OTHER): Payer: Medicare Other

## 2021-12-13 DIAGNOSIS — I428 Other cardiomyopathies: Secondary | ICD-10-CM | POA: Diagnosis not present

## 2021-12-13 LAB — CUP PACEART REMOTE DEVICE CHECK
Battery Remaining Longevity: 27 mo
Battery Voltage: 2.94 V
Brady Statistic RV Percent Paced: 0 %
Date Time Interrogation Session: 20230810001706
HighPow Impedance: 84 Ohm
Implantable Lead Implant Date: 20141202
Implantable Lead Location: 753860
Implantable Lead Model: 6935
Implantable Pulse Generator Implant Date: 20141202
Lead Channel Impedance Value: 494 Ohm
Lead Channel Impedance Value: 513 Ohm
Lead Channel Pacing Threshold Amplitude: 0.5 V
Lead Channel Pacing Threshold Pulse Width: 0.4 ms
Lead Channel Sensing Intrinsic Amplitude: 6 mV
Lead Channel Sensing Intrinsic Amplitude: 6 mV
Lead Channel Setting Pacing Amplitude: 2 V
Lead Channel Setting Pacing Pulse Width: 0.4 ms
Lead Channel Setting Sensing Sensitivity: 0.3 mV

## 2021-12-14 NOTE — Progress Notes (Signed)
Spoke with patient and advised have not received any recommendations from Dr Jens Som.  Advised to take Furosemide 40 mg daily as prescribed.  If he develops fluid symptoms over the weekend to use ER if needed.  He agreed with plan.

## 2021-12-17 ENCOUNTER — Ambulatory Visit (INDEPENDENT_AMBULATORY_CARE_PROVIDER_SITE_OTHER): Payer: Medicare Other

## 2021-12-17 DIAGNOSIS — I5022 Chronic systolic (congestive) heart failure: Secondary | ICD-10-CM

## 2021-12-17 DIAGNOSIS — Z95810 Presence of automatic (implantable) cardiac defibrillator: Secondary | ICD-10-CM

## 2021-12-17 NOTE — Progress Notes (Signed)
EPIC Encounter for ICM Monitoring  Patient Name: Gary Brady is a 71 y.o. male Date: 12/17/2021 Primary Care Physican: Kaleen Mask, MD Primary Cardiologist: Jens Som Electrophysiologist: Allred 07/25/2021 Weight: 231 lbs 10/29/2021 Weight:  230 lbs 12/12/2021 Weight: 229 lbs 12/17/2021 Weight: 228 lbs   Spoke with patient and heart failure questions reviewed.  Pt asymptomatic for fluid accumulation.   He is feeling fine today   Optivol thoracic impedance suggesting improvement and trending back closer to baseline (possible fluid accumulation started 6/19).  Fluid index greater than normal threshold starting 11/03/2021   Prescribed:  Furosemide 40 mg take take 1 tablet daily. He reports Dr Jens Som is aware he takes extra when needed. Klor Con 20 mEq 1 tablet by mouth as directed only take on days that Lasix is taken Farxiga 10 mg take 1 tablet daily  Spironolactone 20 mg take 0.5 tablet twice a day   Labs:  07/18/2021 Creatinine 1.23, BUN 28, Potassium 4.6, Sodium 136, GFR 64 06/28/2021 Creatinine 1.68, BUN 39, Potassium 4.7, Sodium 135, GFR 44 06/13/2021 Creatinine 1.21, BUN 22, Potassium 5.4, Sodium 140, GFR 65  05/08/2021 Creatinine 1.33, BUN 19, Potassium 4.4, Sodium 138, GFR 55 A complete set of results can be found in Results Review.   Recommendations:  Recommendation to limit salt intake to 2000 mg daily and fluid intake to 64 oz daily.  Encouraged to call if experiencing any fluid symptoms.    Follow-up plan: ICM clinic phone appointment on 01/02/2022 to recheck fluid levels.  91 day device clinic remote transmission 03/14/2022.     EP/Cardiology Office Visits:   Recall 06/08/2022 with Dr Elberta Fortis.  01/10/2022 with Dr Jens Som.     Copy of ICM check sent to Dr. Johney Frame.   No recommendations received from Dr Jens Som for 8/7 Optivol report.   3 month ICM trend: 12/17/2021.    12-14 Month ICM trend:     [name], RN 12/17/2021 3:08 PM
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
## 2022-01-02 ENCOUNTER — Ambulatory Visit (INDEPENDENT_AMBULATORY_CARE_PROVIDER_SITE_OTHER): Payer: Medicare Other

## 2022-01-02 DIAGNOSIS — Z95810 Presence of automatic (implantable) cardiac defibrillator: Secondary | ICD-10-CM

## 2022-01-02 DIAGNOSIS — I5022 Chronic systolic (congestive) heart failure: Secondary | ICD-10-CM

## 2022-01-02 NOTE — Progress Notes (Signed)
EPIC Encounter for ICM Monitoring  Patient Name: Gary Brady is a 71 y.o. male Date: 01/02/2022 Primary Care Physican: Kaleen Mask, MD Primary Cardiologist: Jens Som Electrophysiologist: Elberta Fortis 07/25/2021 Weight: 231 lbs 10/29/2021 Weight:  230 lbs 12/12/2021 Weight: 229 lbs 12/17/2021 Weight: 228 lbs   Spoke with patient and heart failure questions reviewed.  Pt asymptomatic for fluid accumulation.  He reports following low salt diet and limiting fluid intake.    Optivol thoracic impedance suggesting improvement and trending back closer to baseline (possible fluid accumulation started 6/19).  Fluid index greater than normal threshold starting 11/03/2021   Prescribed:  Furosemide 40 mg take take 1 tablet daily. He reports Dr Jens Som is aware he takes extra when needed. Klor Con 20 mEq 1 tablet by mouth as directed only take on days that Lasix is taken Farxiga 10 mg take 1 tablet daily  Spironolactone 20 mg take 0.5 tablet twice a day   Labs:  07/18/2021 Creatinine 1.23, BUN 28, Potassium 4.6, Sodium 136, GFR 64 06/28/2021 Creatinine 1.68, BUN 39, Potassium 4.7, Sodium 135, GFR 44 06/13/2021 Creatinine 1.21, BUN 22, Potassium 5.4, Sodium 140, GFR 65  05/08/2021 Creatinine 1.33, BUN 19, Potassium 4.4, Sodium 138, GFR 55 A complete set of results can be found in Results Review.   Recommendations:  Pt will take 60 mg Furosemide x 2 days and then return to 40 mg daily.  Pt reports he has never taken Potassium and advised to discuss with Dr Jens Som at 9/7 OV.    Follow-up plan: ICM clinic phone appointment on 01/09/2022 to recheck fluid levels.  91 day device clinic remote transmission 03/14/2022.     EP/Cardiology Office Visits:   Recall 06/08/2022 with Dr Elberta Fortis.  01/10/2022 with Dr Jens Som.     Copy of ICM check sent to Dr. Elberta Fortis.   3 month ICM trend: 01/02/2022.    12-14 Month ICM trend:     [name], RN 01/02/2022 3:18 PM

## 2022-01-08 NOTE — Progress Notes (Signed)
Remote ICD transmission.   

## 2022-01-09 ENCOUNTER — Ambulatory Visit (INDEPENDENT_AMBULATORY_CARE_PROVIDER_SITE_OTHER): Payer: Medicare Other

## 2022-01-09 DIAGNOSIS — I5022 Chronic systolic (congestive) heart failure: Secondary | ICD-10-CM

## 2022-01-09 DIAGNOSIS — Z95810 Presence of automatic (implantable) cardiac defibrillator: Secondary | ICD-10-CM

## 2022-01-09 NOTE — Progress Notes (Signed)
EPIC Encounter for ICM Monitoring  Patient Name: Gary Brady is a 71 y.o. male Date: 01/09/2022 Primary Care Physican: Kaleen Mask, MD Primary Cardiologist: Jens Som Electrophysiologist: Elberta Fortis 07/25/2021 Weight: 231 lbs 10/29/2021 Weight:  230 lbs 12/12/2021 Weight: 229 lbs 12/17/2021 Weight: 228 lbs 01/09/2022 Weight: 228 lbs   Spoke with patient and heart failure questions reviewed.  Pt asymptomatic for fluid accumulation.  He reports missing several days daily Furosemide due to helping his wife and work.  His wife dementia and possible a stroke last week.     Optivol thoracic impedance suggesting improvement and trending back closer to baseline (possible fluid accumulation started 6/19).  Fluid index greater than normal threshold starting 11/03/2021.    Last time fluid index trended above the normal threshold was March 2022.     Prescribed:  Furosemide 40 mg take take 1 tablet daily. He reports Dr Jens Som is aware he takes extra when needed. Klor Con 20 mEq 1 tablet by mouth as directed only take on days that Lasix is taken Farxiga 10 mg take 1 tablet daily  Spironolactone 20 mg take 0.5 tablet twice a day   Labs:  07/18/2021 Creatinine 1.23, BUN 28, Potassium 4.6, Sodium 136, GFR 64 06/28/2021 Creatinine 1.68, BUN 39, Potassium 4.7, Sodium 135, GFR 44 06/13/2021 Creatinine 1.21, BUN 22, Potassium 5.4, Sodium 140, GFR 65  05/08/2021 Creatinine 1.33, BUN 19, Potassium 4.4, Sodium 138, GFR 55 A complete set of results can be found in Results Review.   Recommendations:  No changes and advised to inform Dr Jens Som that he is not taking Potassium even though it was prescribed to take when taking Lasix.    Follow-up plan: ICM clinic phone appointment on 01/21/2022.  91 day device clinic remote transmission 03/14/2022.     EP/Cardiology Office Visits:   Recall 06/08/2022 with Dr Elberta Fortis.  01/10/2022 with Dr Jens Som.     Copy of ICM check sent to Dr. Elberta Fortis.   3 month ICM trend:  01/09/2022.    12-14 Month ICM trend:     [name], RN 01/09/2022 9:43 AM

## 2022-01-10 ENCOUNTER — Encounter: Payer: Self-pay | Admitting: Cardiology

## 2022-01-10 ENCOUNTER — Ambulatory Visit: Payer: Medicare Other | Attending: Cardiology | Admitting: Cardiology

## 2022-01-10 VITALS — BP 100/70 | HR 68 | Ht 67.0 in | Wt 233.6 lb

## 2022-01-10 DIAGNOSIS — I2510 Atherosclerotic heart disease of native coronary artery without angina pectoris: Secondary | ICD-10-CM | POA: Insufficient documentation

## 2022-01-10 DIAGNOSIS — E7800 Pure hypercholesterolemia, unspecified: Secondary | ICD-10-CM | POA: Diagnosis not present

## 2022-01-10 DIAGNOSIS — I5022 Chronic systolic (congestive) heart failure: Secondary | ICD-10-CM | POA: Insufficient documentation

## 2022-01-10 DIAGNOSIS — I428 Other cardiomyopathies: Secondary | ICD-10-CM | POA: Insufficient documentation

## 2022-01-10 DIAGNOSIS — I10 Essential (primary) hypertension: Secondary | ICD-10-CM | POA: Insufficient documentation

## 2022-01-10 NOTE — Patient Instructions (Signed)
  Testing/Procedures:  Your physician has requested that you have an echocardiogram. Echocardiography is a painless test that uses sound waves to create images of your heart. It provides your doctor with information about the size and shape of your heart and how well your heart's chambers and valves are working. This procedure takes approximately one hour. There are no restrictions for this procedure. [address]   Follow-Up: At [facility], you and your health needs are our priority.  As part of our continuing mission to provide you with exceptional heart care, we have created designated Provider Care Teams.  These Care Teams include your primary Cardiologist (physician) and Advanced Practice Providers (APPs -  Physician Assistants and Nurse Practitioners) who all work together to provide you with the care you need, when you need it.  We recommend signing up for the patient portal called "MyChart".  Sign up information is provided on this After Visit Summary.  MyChart is used to connect with patients for Virtual Visits (Telemedicine).  Patients are able to view lab/test results, encounter notes, upcoming appointments, etc.  Non-urgent messages can be sent to your provider as well.   To learn more about what you can do with MyChart, go to [email or web address].    Your next appointment:   6 month(s)  The format for your next appointment:   In Person  Provider:   Brian Crenshaw, MD    

## 2022-01-19 ENCOUNTER — Other Ambulatory Visit: Payer: Self-pay | Admitting: Internal Medicine

## 2022-01-19 ENCOUNTER — Other Ambulatory Visit: Payer: Self-pay | Admitting: Physician Assistant

## 2022-01-19 DIAGNOSIS — I5022 Chronic systolic (congestive) heart failure: Secondary | ICD-10-CM

## 2022-01-21 ENCOUNTER — Ambulatory Visit (INDEPENDENT_AMBULATORY_CARE_PROVIDER_SITE_OTHER): Payer: Medicare Other

## 2022-01-21 DIAGNOSIS — Z95810 Presence of automatic (implantable) cardiac defibrillator: Secondary | ICD-10-CM

## 2022-01-21 DIAGNOSIS — I5022 Chronic systolic (congestive) heart failure: Secondary | ICD-10-CM | POA: Diagnosis not present

## 2022-01-22 ENCOUNTER — Telehealth (HOSPITAL_COMMUNITY): Payer: Self-pay | Admitting: Cardiology

## 2022-01-22 NOTE — Progress Notes (Signed)
EPIC Encounter for ICM Monitoring  Patient Name: Gary Brady is a 71 y.o. male Date: 01/22/2022 Primary Care Physican: Leonard Downing, MD Primary Cardiologist: Stanford Breed Electrophysiologist: Curt Bears 07/25/2021 Weight: 231 lbs 10/29/2021 Weight:  230 lbs 12/12/2021 Weight: 229 lbs 12/17/2021 Weight: 228 lbs 01/09/2022 Weight: 228 lbs   Spoke with patient and heart failure questions reviewed.  Pt asymptomatic for fluid accumulation.   His wife has dementia and contemplating if he should place her in a facility.   Optivol thoracic impedance suggesting improvement and trending back closer to baseline (possible fluid accumulation started 6/19).  Fluid index greater than normal threshold starting 11/03/2021.     Prescribed:  Furosemide 40 mg take take 1 tablet daily. He reports Dr Stanford Breed is aware he takes extra when needed. Klor Con 20 mEq 1 tablet by mouth as directed only take on days that Lasix is taken Spironolactone 20 mg take 0.5 tablet twice a day   Labs:  07/18/2021 Creatinine 1.23, BUN 28, Potassium 4.6, Sodium 136, GFR 64 06/28/2021 Creatinine 1.68, BUN 39, Potassium 4.7, Sodium 135, GFR 44 06/13/2021 Creatinine 1.21, BUN 22, Potassium 5.4, Sodium 140, GFR 65  05/08/2021 Creatinine 1.33, BUN 19, Potassium 4.4, Sodium 138, GFR 55 A complete set of results can be found in Results Review.   Recommendations:  No changes and encouraged to call if experiencing any fluid symptoms.  Dr Stanford Breed did not make any changes at 01/10/2022 OV.     Follow-up plan: ICM clinic phone appointment on 02/04/2022.  91 day device clinic remote transmission 03/14/2022.     EP/Cardiology Office Visits:   Recall 06/08/2022 with Dr Curt Bears.  07/15/2022 with Dr Stanford Breed.     Copy of ICM check sent to Dr. Curt Bears.     3 month ICM trend: 01/21/2022.    12-14 Month ICM trend:     Rosalene Billings, RN 01/22/2022 4:20 PM

## 2022-01-22 NOTE — Telephone Encounter (Signed)
Patient called and cancelled echocardiogram for the reason below:  01/22/22 pt cancelled and will call back to reschedule due to spouse has dementia/LBW   Order will be removed from the echo WQ. When patient calls back we will reinstate the order. Thank you

## 2022-01-25 ENCOUNTER — Other Ambulatory Visit (HOSPITAL_COMMUNITY): Payer: Medicare Other

## 2022-02-04 ENCOUNTER — Ambulatory Visit (INDEPENDENT_AMBULATORY_CARE_PROVIDER_SITE_OTHER): Payer: Medicare Other

## 2022-02-04 DIAGNOSIS — Z95810 Presence of automatic (implantable) cardiac defibrillator: Secondary | ICD-10-CM

## 2022-02-04 DIAGNOSIS — I5022 Chronic systolic (congestive) heart failure: Secondary | ICD-10-CM

## 2022-02-04 NOTE — Progress Notes (Signed)
EPIC Encounter for ICM Monitoring  Patient Name: Gary Brady is a 71 y.o. male Date: 02/04/2022 Primary Care Physican: Leonard Downing, MD Primary Cardiologist: Stanford Breed Electrophysiologist: Curt Bears 07/25/2021 Weight: 231 lbs 10/29/2021 Weight:  230 lbs 12/12/2021 Weight: 229 lbs 12/17/2021 Weight: 228 lbs 01/09/2022 Weight: 228 lbs   Spoke with patient and heart failure questions reviewed.  Pt asymptomatic for fluid accumulation.        Optivol thoracic impedance suggesting possible fluid accumulation starting 6/19.  Fluid index greater than normal threshold starting 11/03/2021.     Prescribed:  Furosemide 40 mg take take 1 tablet daily. He reports Dr Stanford Breed is aware he takes extra when needed. Klor Con 20 mEq 1 tablet by mouth as directed only take on days that Lasix is taken Spironolactone 20 mg take 0.5 tablet twice a day   Labs:  07/18/2021 Creatinine 1.23, BUN 28, Potassium 4.6, Sodium 136, GFR 64 06/28/2021 Creatinine 1.68, BUN 39, Potassium 4.7, Sodium 135, GFR 44 06/13/2021 Creatinine 1.21, BUN 22, Potassium 5.4, Sodium 140, GFR 65  05/08/2021 Creatinine 1.33, BUN 19, Potassium 4.4, Sodium 138, GFR 55 A complete set of results can be found in Results Review.   Recommendations:  No changes and advised importance of monitor for fluid symptoms and to call if symptoms develop.      Follow-up plan: ICM clinic phone appointment on 02/25/2022.  91 day device clinic remote transmission 03/14/2022.     EP/Cardiology Office Visits:   Recall 06/08/2022 with Dr Curt Bears.  07/15/2022 with Dr Stanford Breed.     Copy of ICM check sent to Dr. Curt Bears and Dr Stanford Breed as Juluis Rainier that possible fluid accumulation continues and pt remains asymptomatic.     3 month ICM trend: 02/04/2022.    12-14 Month ICM trend:     Rosalene Billings, RN 02/04/2022 10:42 AM

## 2022-02-25 ENCOUNTER — Ambulatory Visit (INDEPENDENT_AMBULATORY_CARE_PROVIDER_SITE_OTHER): Payer: Medicare Other

## 2022-02-25 ENCOUNTER — Telehealth: Payer: Self-pay

## 2022-02-25 DIAGNOSIS — Z95810 Presence of automatic (implantable) cardiac defibrillator: Secondary | ICD-10-CM

## 2022-02-25 DIAGNOSIS — I5022 Chronic systolic (congestive) heart failure: Secondary | ICD-10-CM

## 2022-02-25 NOTE — Progress Notes (Signed)
EPIC Encounter for ICM Monitoring  Patient Name: Gary Brady is a 71 y.o. male Date: 02/25/2022 Primary Care Physican: Leonard Downing, MD Primary Cardiologist: Stanford Breed Electrophysiologist: Curt Bears 07/25/2021 Weight: 231 lbs 10/29/2021 Weight:  230 lbs 12/12/2021 Weight: 229 lbs 12/17/2021 Weight: 228 lbs 01/09/2022 Weight: 228 lbs   Attempted call to patient and unable to reach.  Transmission reviewed.    Optivol thoracic impedance suggesting ongoing possible fluid accumulation starting 6/19 with exception of 10/16-10/18 at baseline.  Fluid index greater than normal threshold starting 11/03/2021.     Prescribed:  Furosemide 40 mg take take 1 tablet daily. He reports Dr Stanford Breed is aware he takes extra when needed. Klor Con 20 mEq 1 tablet by mouth as directed only take on days that Lasix is taken Spironolactone 20 mg take 0.5 tablet twice a day   Labs:  07/18/2021 Creatinine 1.23, BUN 28, Potassium 4.6, Sodium 136, GFR 64 06/28/2021 Creatinine 1.68, BUN 39, Potassium 4.7, Sodium 135, GFR 44 06/13/2021 Creatinine 1.21, BUN 22, Potassium 5.4, Sodium 140, GFR 65  05/08/2021 Creatinine 1.33, BUN 19, Potassium 4.4, Sodium 138, GFR 55 A complete set of results can be found in Results Review.   Recommendations:  Unable to reach.      Follow-up plan: ICM clinic phone appointment on 03/18/2022 to recheck fluid levels.  91 day device clinic remote transmission 03/14/2022.     EP/Cardiology Office Visits:   Recall 06/08/2022 with Dr Curt Bears.  07/15/2022 with Dr Stanford Breed.     Copy of ICM check sent to Dr. Curt Bears.   Will send a copy to Dr Stanford Breed for review if patient is reached.    3 month ICM trend: 02/25/2022.    12-14 Month ICM trend:     Rosalene Billings, RN 02/25/2022 7:13 AM

## 2022-02-25 NOTE — Telephone Encounter (Signed)
Remote ICM transmission received.  Attempted call to patient regarding ICM remote transmission and left detailed message per DPR.  Advised to return call for any fluid symptoms or questions. Next ICM remote transmission scheduled 03/18/2022.    

## 2022-03-14 ENCOUNTER — Ambulatory Visit (INDEPENDENT_AMBULATORY_CARE_PROVIDER_SITE_OTHER): Payer: Medicare Other

## 2022-03-14 DIAGNOSIS — I428 Other cardiomyopathies: Secondary | ICD-10-CM

## 2022-03-14 LAB — CUP PACEART REMOTE DEVICE CHECK
Battery Remaining Longevity: 25 mo
Battery Voltage: 2.94 V
Brady Statistic RV Percent Paced: 0.01 %
Date Time Interrogation Session: 20231109012704
HighPow Impedance: 81 Ohm
Implantable Lead Connection Status: 753985
Implantable Lead Implant Date: 20141202
Implantable Lead Location: 753860
Implantable Lead Model: 6935
Implantable Pulse Generator Implant Date: 20141202
Lead Channel Impedance Value: 494 Ohm
Lead Channel Impedance Value: 532 Ohm
Lead Channel Pacing Threshold Amplitude: 0.375 V
Lead Channel Pacing Threshold Pulse Width: 0.4 ms
Lead Channel Sensing Intrinsic Amplitude: 5.125 mV
Lead Channel Sensing Intrinsic Amplitude: 5.125 mV
Lead Channel Setting Pacing Amplitude: 2 V
Lead Channel Setting Pacing Pulse Width: 0.4 ms
Lead Channel Setting Sensing Sensitivity: 0.3 mV
Zone Setting Status: 755011

## 2022-03-18 ENCOUNTER — Ambulatory Visit (INDEPENDENT_AMBULATORY_CARE_PROVIDER_SITE_OTHER): Payer: Medicare Other

## 2022-03-18 DIAGNOSIS — Z95810 Presence of automatic (implantable) cardiac defibrillator: Secondary | ICD-10-CM

## 2022-03-18 DIAGNOSIS — I5022 Chronic systolic (congestive) heart failure: Secondary | ICD-10-CM

## 2022-03-20 NOTE — Progress Notes (Signed)
EPIC Encounter for ICM Monitoring  Patient Name: Gary Brady is a 71 y.o. male Date: 03/20/2022 Primary Care Physican: Kaleen Mask, MD Primary Cardiologist: Jens Som Electrophysiologist: Elberta Fortis 07/25/2021 Weight: 231 lbs 10/29/2021 Weight:  230 lbs 12/12/2021 Weight: 229 lbs 12/17/2021 Weight: 228 lbs 01/09/2022 Weight: 228 lbs 03/20/2022 Weight: 219 lbs   Spoke with patient and heart failure questions reviewed.  Transmission results reviewed.  Pt asymptomatic for fluid accumulation.  Wife passed away 4 weeks.    Pt eats foods higher in salt at times and limited cooking at home.    Optivol thoracic impedance suggesting ongoing possible fluid accumulation starting 6/19 but improving since 11/8.  Fluid index greater than normal threshold starting 11/03/2021.     Prescribed:  Furosemide 40 mg take take 1 tablet daily. He reports Dr Jens Som is aware he takes extra when needed. Klor Con 20 mEq 1 tablet by mouth as directed only take on days that Lasix is taken Spironolactone 20 mg take 0.5 tablet twice a day   Labs:  07/18/2021 Creatinine 1.23, BUN 28, Potassium 4.6, Sodium 136, GFR 64 06/28/2021 Creatinine 1.68, BUN 39, Potassium 4.7, Sodium 135, GFR 44 06/13/2021 Creatinine 1.21, BUN 22, Potassium 5.4, Sodium 140, GFR 65  05/08/2021 Creatinine 1.33, BUN 19, Potassium 4.4, Sodium 138, GFR 55 A complete set of results can be found in Results Review.   Recommendations:  No changes and encouraged to call if experiencing any fluid symptoms.   Copy sent to Dr Jens Som as Lorain Childes.    Follow-up plan: ICM clinic phone appointment on 04/15/2022.  91 day device clinic remote transmission 06/13/2022.     EP/Cardiology Office Visits:   Recall 06/08/2022 with Dr Elberta Fortis.  07/15/2022 with Dr Jens Som.     Copy of ICM check sent to Dr. Elberta Fortis.  3 month ICM trend: 03/18/2022.    12-14 Month ICM trend:     [name], RN 03/20/2022 1:27 PM

## 2022-03-22 NOTE — Progress Notes (Signed)
Remote ICD transmission.   

## 2022-04-04 ENCOUNTER — Other Ambulatory Visit: Payer: Self-pay | Admitting: Cardiology

## 2022-04-15 ENCOUNTER — Ambulatory Visit (INDEPENDENT_AMBULATORY_CARE_PROVIDER_SITE_OTHER): Payer: Medicare Other

## 2022-04-15 DIAGNOSIS — Z95810 Presence of automatic (implantable) cardiac defibrillator: Secondary | ICD-10-CM

## 2022-04-15 DIAGNOSIS — I5022 Chronic systolic (congestive) heart failure: Secondary | ICD-10-CM

## 2022-04-17 ENCOUNTER — Telehealth: Payer: Self-pay

## 2022-04-17 NOTE — Telephone Encounter (Signed)
Remote ICM transmission received.  Attempted call to patient regarding ICM remote transmission and no answer or voice mail option.  

## 2022-04-17 NOTE — Progress Notes (Signed)
EPIC Encounter for ICM Monitoring  Patient Name: Gary Brady is a 71 y.o. male Date: 04/17/2022 Primary Care Physican: Kaleen Mask, MD Primary Cardiologist: Jens Som Electrophysiologist: Elberta Fortis 07/25/2021 Weight: 231 lbs 10/29/2021 Weight:  230 lbs 12/12/2021 Weight: 229 lbs 12/17/2021 Weight: 228 lbs 01/09/2022 Weight: 228 lbs 03/20/2022 Weight: 219 lbs   Attempted call to patient and unable to reach.   Transmission reviewed.    Pt eats foods higher in salt at times and limited cooking at home.    Optivol thoracic impedance suggesting ongoing possible fluid accumulation starting 6/19 but returned to baseline 12/2.  Fluid index greater than normal threshold starting 11/03/2021.     Prescribed:  Furosemide 40 mg take take 1 tablet daily. He reports Dr Jens Som is aware he takes extra when needed. Klor Con 20 mEq 1 tablet by mouth as directed only take on days that Lasix is taken Spironolactone 20 mg take 0.5 tablet twice a day   Labs:  07/18/2021 Creatinine 1.23, BUN 28, Potassium 4.6, Sodium 136, GFR 64 06/28/2021 Creatinine 1.68, BUN 39, Potassium 4.7, Sodium 135, GFR 44 06/13/2021 Creatinine 1.21, BUN 22, Potassium 5.4, Sodium 140, GFR 65  05/08/2021 Creatinine 1.33, BUN 19, Potassium 4.4, Sodium 138, GFR 55 A complete set of results can be found in Results Review.   Recommendations:  Unable to reach.     Follow-up plan: ICM clinic phone appointment on 05/27/2022.  91 day device clinic remote transmission 06/13/2022.     EP/Cardiology Office Visits:   Recall 06/08/2022 with Dr Elberta Fortis.  07/15/2022 with Dr Jens Som.     Copy of ICM check sent to Dr. Elberta Fortis.   3 month ICM trend: 04/15/2022.    12-14 Month ICM trend:     [name], RN 04/17/2022 8:45 AM

## 2022-05-27 ENCOUNTER — Ambulatory Visit: Payer: Medicare Other | Attending: Cardiology

## 2022-05-27 DIAGNOSIS — Z95810 Presence of automatic (implantable) cardiac defibrillator: Secondary | ICD-10-CM

## 2022-05-27 DIAGNOSIS — I5022 Chronic systolic (congestive) heart failure: Secondary | ICD-10-CM

## 2022-05-31 NOTE — Progress Notes (Signed)
EPIC Encounter for ICM Monitoring  Patient Name: Gary Brady is a 72 y.o. male Date: 05/31/2022 Primary Care Physican: Leonard Downing, MD Primary Cardiologist: Stanford Breed Electrophysiologist: Curt Bears 07/25/2021 Weight: 231 lbs 10/29/2021 Weight:  230 lbs 12/12/2021 Weight: 229 lbs 12/17/2021 Weight: 228 lbs 01/09/2022 Weight: 228 lbs 03/20/2022 Weight: 219 lbs   Spoke with patient and heart failure questions reviewed.  Transmission results reviewed.  Pt reports a respiratory infection.     Pt eats foods higher in salt at times and limited cooking at home.    Optivol thoracic impedance normal but was suggesting intermittent days with possible fluid accumulation within the last month.   Fluid index greater than normal threshold starting 11/03/2021.     Prescribed:  Furosemide 40 mg take take 1 tablet daily. He reports Dr Stanford Breed is aware he takes extra when needed. Klor Con 20 mEq 1 tablet by mouth as directed only take on days that Lasix is taken Spironolactone 20 mg take 0.5 tablet twice a day   Labs:  07/18/2021 Creatinine 1.23, BUN 28, Potassium 4.6, Sodium 136, GFR 64 06/28/2021 Creatinine 1.68, BUN 39, Potassium 4.7, Sodium 135, GFR 44 06/13/2021 Creatinine 1.21, BUN 22, Potassium 5.4, Sodium 140, GFR 65  05/08/2021 Creatinine 1.33, BUN 19, Potassium 4.4, Sodium 138, GFR 55 A complete set of results can be found in Results Review.   Recommendations: Recommendation to limit salt intake to 2000 mg daily and fluid intake to 64 oz daily.  Encouraged to call if experiencing any fluid symptoms.    Follow-up plan: ICM clinic phone appointment on 07/01/2022.  91 day device clinic remote transmission 06/13/2022.     EP/Cardiology Office Visits:   Recall 06/08/2022 with Dr Curt Bears.  07/15/2022 with Dr Stanford Breed.     Copy of ICM check sent to Dr. Curt Bears.  3 month ICM trend: 05/27/2022.    12-14 Month ICM trend:     Rosalene Billings, RN 05/31/2022 3:13 PM

## 2022-06-13 ENCOUNTER — Ambulatory Visit (INDEPENDENT_AMBULATORY_CARE_PROVIDER_SITE_OTHER): Payer: Medicare Other

## 2022-06-13 DIAGNOSIS — I428 Other cardiomyopathies: Secondary | ICD-10-CM

## 2022-06-13 LAB — CUP PACEART REMOTE DEVICE CHECK
Battery Remaining Longevity: 22 mo
Battery Voltage: 2.93 V
Brady Statistic RV Percent Paced: 0.01 %
Date Time Interrogation Session: 20240208033326
HighPow Impedance: 67 Ohm
Implantable Lead Connection Status: 753985
Implantable Lead Implant Date: 20141202
Implantable Lead Location: 753860
Implantable Lead Model: 6935
Implantable Pulse Generator Implant Date: 20141202
Lead Channel Impedance Value: 437 Ohm
Lead Channel Impedance Value: 513 Ohm
Lead Channel Pacing Threshold Amplitude: 0.375 V
Lead Channel Pacing Threshold Pulse Width: 0.4 ms
Lead Channel Sensing Intrinsic Amplitude: 4.75 mV
Lead Channel Sensing Intrinsic Amplitude: 4.75 mV
Lead Channel Setting Pacing Amplitude: 2 V
Lead Channel Setting Pacing Pulse Width: 0.4 ms
Lead Channel Setting Sensing Sensitivity: 0.3 mV
Zone Setting Status: 755011

## 2022-07-01 ENCOUNTER — Ambulatory Visit: Payer: Medicare Other | Attending: Cardiology

## 2022-07-01 DIAGNOSIS — Z95810 Presence of automatic (implantable) cardiac defibrillator: Secondary | ICD-10-CM | POA: Diagnosis not present

## 2022-07-01 DIAGNOSIS — I5022 Chronic systolic (congestive) heart failure: Secondary | ICD-10-CM

## 2022-07-02 NOTE — Progress Notes (Signed)
EPIC Encounter for ICM Monitoring  Patient Name: Gary Brady is a 72 y.o. male Date: 07/02/2022 Primary Care Physican: Leonard Downing, MD Primary Cardiologist: Stanford Breed Electrophysiologist: Curt Bears 07/25/2021 Weight: 231 lbs 10/29/2021 Weight:  230 lbs 12/12/2021 Weight: 229 lbs 12/17/2021 Weight: 228 lbs 01/09/2022 Weight: 228 lbs 03/20/2022 Weight: 219 lbs   Spoke with patient and heart failure questions reviewed.  Transmission results reviewed.  Pt reports [place] the [place] and feeling okay.    Pt eats foods higher in salt at times and limited cooking at home.    Optivol thoracic impedance suggesting possible fluid accumulation starting 2/18.      Prescribed:  Furosemide 40 mg take take 1 tablet daily. He reports Dr Stanford Breed is aware he takes extra when needed. Klor Con 20 mEq 1 tablet by mouth as directed only take on days that Lasix is taken Spironolactone 20 mg take 0.5 tablet twice a day   Labs:  07/18/2021 Creatinine 1.23, BUN 28, Potassium 4.6, Sodium 136, GFR 64 06/28/2021 Creatinine 1.68, BUN 39, Potassium 4.7, Sodium 135, GFR 44 06/13/2021 Creatinine 1.21, BUN 22, Potassium 5.4, Sodium 140, GFR 65  05/08/2021 Creatinine 1.33, BUN 19, Potassium 4.4, Sodium 138, GFR 55 A complete set of results can be found in Results Review.   Recommendations: Recommendation to limit salt intake and to tae extra Furosemide if needed per his previous discussions with Dr Stanford Breed.  Fluid level recheck at Dr Curt Bears visit 3/6.   Follow-up plan: ICM clinic phone appointment on 07/15/2022 to recheck fluid levels.  91 day device clinic remote transmission 09/12/2022.     EP/Cardiology Office Visits:   07/10/2022 with Dr Curt Bears.  07/15/2022 with Dr Stanford Breed.     Copy of ICM check sent to Dr. Curt Bears.  3 month ICM trend: 07/01/2022.    12-14 Month ICM trend:     Rosalene Billings, RN 07/02/2022 12:19 PM

## 2022-07-05 NOTE — Progress Notes (Signed)
Remote ICD transmission.   
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
## 2022-07-10 ENCOUNTER — Ambulatory Visit: Payer: Medicare Other | Attending: Cardiology | Admitting: Cardiology

## 2022-07-10 ENCOUNTER — Encounter: Payer: Self-pay | Admitting: Cardiology

## 2022-07-10 VITALS — BP 116/70 | HR 72 | Ht 67.0 in | Wt 232.0 lb

## 2022-07-10 DIAGNOSIS — I5022 Chronic systolic (congestive) heart failure: Secondary | ICD-10-CM | POA: Insufficient documentation

## 2022-07-10 DIAGNOSIS — I2510 Atherosclerotic heart disease of native coronary artery without angina pectoris: Secondary | ICD-10-CM | POA: Diagnosis not present

## 2022-07-10 DIAGNOSIS — D6869 Other thrombophilia: Secondary | ICD-10-CM | POA: Diagnosis not present
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
## 2022-07-10 NOTE — Patient Instructions (Signed)
Medication Instructions:  Your physician recommends that you continue on your current medications as directed. Please refer to the Current Medication list given to you today.  *If you need a refill on your cardiac medications before your next appointment, please call your pharmacy*   Lab Work: None ordered If you have labs (blood work) drawn today and your tests are completely normal, you will receive your results only by: [place] (if you have MyChart) OR A paper copy in the mail If you have any lab test that is abnormal or we need to change your treatment, we will call you to review the results.   Testing/Procedures: None ordered   Follow-Up: At [facility], you and your health needs are our priority.  As part of our continuing mission to provide you with exceptional heart care, we have created designated Provider Care Teams.  These Care Teams include your primary Cardiologist (physician) and Advanced Practice Providers (APPs -  Physician Assistants and Nurse Practitioners) who all work together to provide you with the care you need, when you need it.  We recommend signing up for the patient portal called "MyChart".  Sign up information is provided on this After Visit Summary.  MyChart is used to connect with patients for Virtual Visits (Telemedicine).  Patients are able to view lab/test results, encounter notes, upcoming appointments, etc.  Non-urgent messages can be sent to your provider as well.   To learn more about what you can do with MyChart, go to [email or web address].    Your next appointment:   1 year(s)  The format for your next appointment:   In Person  Provider:   You will see one of the following Advanced Practice Providers on your designated Care Team:   Tommye Standard, [place] Legrand Como "Jonni Sanger" Chalmers Cater, [place]  Thank you for choosing [facility]!!   Trinidad Curet, RN [phone]  Other Instructions

## 2022-07-15 ENCOUNTER — Ambulatory Visit: Payer: Medicare Other | Attending: Cardiology | Admitting: Cardiology

## 2022-07-15 ENCOUNTER — Ambulatory Visit: Payer: Medicare Other | Attending: Cardiology

## 2022-07-15 ENCOUNTER — Other Ambulatory Visit: Payer: Self-pay | Admitting: Cardiology

## 2022-07-15 ENCOUNTER — Encounter: Payer: Self-pay | Admitting: Cardiology

## 2022-07-15 VITALS — BP 166/62 | HR 79 | Ht 67.0 in | Wt 230.9 lb

## 2022-07-15 DIAGNOSIS — Z95810 Presence of automatic (implantable) cardiac defibrillator: Secondary | ICD-10-CM

## 2022-07-15 DIAGNOSIS — I5022 Chronic systolic (congestive) heart failure: Secondary | ICD-10-CM

## 2022-07-15 DIAGNOSIS — E7800 Pure hypercholesterolemia, unspecified: Secondary | ICD-10-CM

## 2022-07-15 DIAGNOSIS — I2510 Atherosclerotic heart disease of native coronary artery without angina pectoris: Secondary | ICD-10-CM | POA: Diagnosis present

## 2022-07-15 DIAGNOSIS — I428 Other cardiomyopathies: Secondary | ICD-10-CM

## 2022-07-15 DIAGNOSIS — I10 Essential (primary) hypertension: Secondary | ICD-10-CM | POA: Diagnosis present

## 2022-07-15 LAB — COMPREHENSIVE METABOLIC PANEL
ALT: 21 IU/L (ref 0–44)
AST: 12 IU/L (ref 0–40)
Albumin/Globulin Ratio: 1.7 (ref 1.2–2.2)
Albumin: 4.5 g/dL (ref 3.9–4.9)
Alkaline Phosphatase: 90 IU/L (ref 44–121)
BUN/Creatinine Ratio: 18 (ref 10–24)
BUN: 28 mg/dL — ABNORMAL HIGH (ref 8–27)
Bilirubin Total: 0.7 mg/dL (ref 0.0–1.2)
CO2: 25 mmol/L (ref 20–29)
Calcium: 9.5 mg/dL (ref 8.6–10.2)
Chloride: 97 mmol/L (ref 96–106)
Creatinine, Ser: 1.57 mg/dL — ABNORMAL HIGH (ref 0.76–1.27)
Globulin, Total: 2.6 g/dL (ref 1.5–4.5)
Glucose: 249 mg/dL — ABNORMAL HIGH (ref 70–99)
Potassium: 4.8 mmol/L (ref 3.5–5.2)
Sodium: 137 mmol/L (ref 134–144)
Total Protein: 7.1 g/dL (ref 6.0–8.5)
eGFR: 47 mL/min/{1.73_m2} — ABNORMAL LOW (ref >59)

## 2022-07-15 LAB — LIPID PANEL
Chol/HDL Ratio: 3.1 ratio (ref 0.0–5.0)
Cholesterol, Total: 99 mg/dL — ABNORMAL LOW (ref 100–199)
HDL: 32 mg/dL — ABNORMAL LOW (ref >39)
LDL Chol Calc (NIH): 37 mg/dL (ref 0–99)
Triglycerides: 182 mg/dL — ABNORMAL HIGH (ref 0–149)
VLDL Cholesterol Cal: 30 mg/dL (ref 5–40)

## 2022-07-15 NOTE — Progress Notes (Signed)
EPIC Encounter for ICM Monitoring  Patient Name: Gary Brady is a 72 y.o. male Date: 07/15/2022 Primary Care Physican: Leonard Downing, MD Primary Cardiologist: Stanford Breed Electrophysiologist: Curt Bears 07/15/2022 Weight: 222 lbs   Spoke with patient and heart failure questions reviewed.  Transmission results reviewed.  Pt asymptomatic for fluid accumulation.  Reports feeling well at this time and voices no complaints.   Was on cruise end of February during decreased impedance.    Pt eats foods higher in salt at times and limited cooking at home.    Optivol thoracic impedance suggesting fluid levels returned to normal since 3/4.      Prescribed:  Furosemide 40 mg take take 1 tablet daily. He reports Dr Stanford Breed is aware he takes extra when needed. Klor Con 20 mEq 1 tablet by mouth as directed only take on days that Lasix is taken Spironolactone 20 mg take 0.5 tablet twice a day   Labs:  07/18/2021 Creatinine 1.23, BUN 28, Potassium 4.6, Sodium 136, GFR 64 06/28/2021 Creatinine 1.68, BUN 39, Potassium 4.7, Sodium 135, GFR 44 06/13/2021 Creatinine 1.21, BUN 22, Potassium 5.4, Sodium 140, GFR 65  05/08/2021 Creatinine 1.33, BUN 19, Potassium 4.4, Sodium 138, GFR 55 A complete set of results can be found in Results Review.   Recommendations:   No changes and encouraged to call if experiencing any fluid symptoms.   Follow-up plan: ICM clinic phone appointment on 08/12/2022.  91 day device clinic remote transmission 09/12/2022.     EP/Cardiology Office Visits:   Recall 07/05/2023 with Dr Curt Bears.  07/15/2022 with Dr Stanford Breed.     Copy of ICM check sent to Dr. Curt Bears and Dr Stanford Breed as Juluis Rainier for 3/11 OV.  3 month ICM trend: 07/15/2022.    12-14 Month ICM trend:     Rosalene Billings, RN 07/15/2022 8:52 AM

## 2022-07-15 NOTE — Patient Instructions (Signed)
  Testing/Procedures:  Your physician has requested that you have an echocardiogram. Echocardiography is a painless test that uses sound waves to create images of your heart. It provides your doctor with information about the size and shape of your heart and how well your heart's chambers and valves are working. This procedure takes approximately one hour. There are no restrictions for this procedure. Please do NOT wear cologne, perfume, aftershave, or lotions (deodorant is allowed). Please arrive 15 minutes prior to your appointment time. [address]   Follow-Up: At [facility], you and your health needs are our priority.  As part of our continuing mission to provide you with exceptional heart care, we have created designated Provider Care Teams.  These Care Teams include your primary Cardiologist (physician) and Advanced Practice Providers (APPs -  Physician Assistants and Nurse Practitioners) who all work together to provide you with the care you need, when you need it.  We recommend signing up for the patient portal called "MyChart".  Sign up information is provided on this After Visit Summary.  MyChart is used to connect with patients for Virtual Visits (Telemedicine).  Patients are able to view lab/test results, encounter notes, upcoming appointments, etc.  Non-urgent messages can be sent to your provider as well.   To learn more about what you can do with MyChart, go to [email or web address].    Your next appointment:   6 month(s)  Provider:   Brian Crenshaw, MD      

## 2022-07-16 ENCOUNTER — Other Ambulatory Visit: Payer: Self-pay

## 2022-07-16 ENCOUNTER — Encounter: Payer: Self-pay | Admitting: *Deleted

## 2022-07-16 NOTE — Telephone Encounter (Signed)
was reordered and pended by Lelon Perla, MD

## 2022-07-22 ENCOUNTER — Other Ambulatory Visit: Payer: Self-pay | Admitting: Cardiology

## 2022-07-27 ENCOUNTER — Other Ambulatory Visit: Payer: Self-pay | Admitting: Cardiology

## 2022-07-27 ENCOUNTER — Other Ambulatory Visit: Payer: Self-pay | Admitting: Physician Assistant

## 2022-07-27 DIAGNOSIS — I5022 Chronic systolic (congestive) heart failure: Secondary | ICD-10-CM

## 2022-08-12 ENCOUNTER — Ambulatory Visit: Payer: Medicare Other | Attending: Cardiology

## 2022-08-12 DIAGNOSIS — Z95810 Presence of automatic (implantable) cardiac defibrillator: Secondary | ICD-10-CM | POA: Diagnosis not present

## 2022-08-12 DIAGNOSIS — I5022 Chronic systolic (congestive) heart failure: Secondary | ICD-10-CM

## 2022-08-14 NOTE — Progress Notes (Signed)
EPIC Encounter for ICM Monitoring  Patient Name: Gary Brady is a 72 y.o. male Date: 08/14/2022 Primary Care Physican: Kaleen Mask, MD Primary Cardiologist: Jens Som Electrophysiologist: Elberta Fortis 07/15/2022 Weight: 222 lbs 08/14/2022 Weight: 231 lbs   Spoke with patient and heart failure questions reviewed.  Transmission results reviewed.  Pt reports weight gain of 8-10 lbs within the last few weeks.  He easily gains 1 lb daily if he does not take daily Lasix and has been doubling his dosage for the month March to 80 mg daily.      Pt eats foods higher in salt at times and limited cooking at home.    Optivol thoracic impedance suggesting fluid levels returned to normal since 3/4.      Prescribed:  Furosemide 40 mg take take 1 tablet daily.  08/14/2022 Pt reports he has been taking Furosemide 2 tablets (80 mg) daily but did not discuss with Dr Jens Som at 3/11 OV. Klor Con 20 mEq 1 tablet by mouth as directed only take on days that Lasix is taken Spironolactone 20 mg take 0.5 tablet twice a day   Labs:  07/15/2022 Creatinine 1.57, BUN 28, Potassium 4.8, Sodium 137, GFR 47 A complete set of results can be found in Results Review.   Recommendations:   Advised patient he should only be taking Furosemide 40 mg daily as prescribed and only taking extra if approved by Dr Jens Som.    Pt thinks taking 80 mg Furosemide daily is helping him keep fluid weight off.     Advised will inform Dr Jens Som that he is taking Lasix 80 mg daily and ask for recommendations if patient should continue with 80 mg daily or take 40 mg daily as prescribed.     Follow-up plan: ICM clinic phone appointment on 09/16/2022.  91 day device clinic remote transmission 09/12/2022.     EP/Cardiology Office Visits:   Recall 07/05/2023 with Dr Elberta Fortis.  Recall 01/15/2023 with Dr Jens Som.   Copy of ICM check sent to Dr. Elberta Fortis.  3 month ICM trend: 08/12/2022.    12-14 Month ICM trend:     [name],  RN 08/14/2022 4:50 PM

## 2022-08-15 ENCOUNTER — Telehealth: Payer: Self-pay | Admitting: *Deleted

## 2022-08-15 DIAGNOSIS — I5022 Chronic systolic (congestive) heart failure: Secondary | ICD-10-CM

## 2022-08-15 MED ORDER — FUROSEMIDE 40 MG PO TABS: 80.0000 mg | ORAL_TABLET | Freq: Every day | ORAL | 3 refills | Status: DC

## 2022-08-15 NOTE — Telephone Encounter (Signed)
-----   Message from Lewayne Bunting, MD sent at 08/15/2022  7:33 AM EDT ----- Continue lasix 80 mg daily; check bmet [name]  ----- Message ----- From: [name], RN Sent: 08/14/2022   5:09 PM EDT To: Lewayne Bunting, MD  Requesting review and recommendation regarding Lasix dosage.  Thank you.

## 2022-08-15 NOTE — Telephone Encounter (Signed)
Spoke with Gary Brady, Aware of dr crenshaw's recommendations.  Lab orders mailed to the Gary Brady  

## 2022-08-16 NOTE — Progress Notes (Signed)
Spoke with patient and he reported Dr Waunita Schooner nurse called with recommendation to stay on Furosemide 80 mg daily and will have BMET drawn 4/15.   He verbalized understanding of recommendation.

## 2022-08-19 ENCOUNTER — Encounter: Payer: Self-pay | Admitting: *Deleted

## 2022-08-19 ENCOUNTER — Ambulatory Visit (HOSPITAL_COMMUNITY): Payer: Medicare Other | Attending: Cardiology

## 2022-08-19 ENCOUNTER — Ambulatory Visit: Payer: Medicare Other | Attending: Cardiology

## 2022-08-19 DIAGNOSIS — I428 Other cardiomyopathies: Secondary | ICD-10-CM

## 2022-08-19 LAB — BASIC METABOLIC PANEL
BUN/Creatinine Ratio: 21 (ref 10–24)
BUN: 46 mg/dL — ABNORMAL HIGH (ref 8–27)
CO2: 19 mmol/L — ABNORMAL LOW (ref 20–29)
Calcium: 9.1 mg/dL (ref 8.6–10.2)
Chloride: 102 mmol/L (ref 96–106)
Creatinine, Ser: 2.22 mg/dL — ABNORMAL HIGH (ref 0.76–1.27)
Glucose: 174 mg/dL — ABNORMAL HIGH (ref 70–99)
Potassium: 5 mmol/L (ref 3.5–5.2)
Sodium: 136 mmol/L (ref 134–144)
eGFR: 31 mL/min/{1.73_m2} — ABNORMAL LOW (ref >59)

## 2022-08-19 LAB — ECHOCARDIOGRAM COMPLETE
Area-P 1/2: 3.77 cm2
S' Lateral: 3.2 cm

## 2022-08-20 ENCOUNTER — Telehealth: Payer: Self-pay | Admitting: *Deleted

## 2022-08-20 DIAGNOSIS — I5022 Chronic systolic (congestive) heart failure: Secondary | ICD-10-CM

## 2022-08-20 MED ORDER — FUROSEMIDE 40 MG PO TABS: 40.0000 mg | ORAL_TABLET | Freq: Every day | ORAL | 3 refills | Status: DC

## 2022-08-20 NOTE — Telephone Encounter (Signed)
-----   Message from Lewayne Bunting, MD sent at 08/20/2022  7:09 AM EDT ----- Renal function worse; hold lasix for two days then resume at 40 mg daily with additional 40 mg daily for weight gain of 2-3 lbs; hold spironolactone; bmet one week [name]

## 2022-08-20 NOTE — Telephone Encounter (Signed)
Spoke with pt, Aware of dr crenshaw's recommendations.  Lab orders mailed to the pt  

## 2022-09-05 ENCOUNTER — Encounter: Payer: Self-pay | Admitting: *Deleted

## 2022-09-12 ENCOUNTER — Ambulatory Visit (INDEPENDENT_AMBULATORY_CARE_PROVIDER_SITE_OTHER): Payer: Medicare Other

## 2022-09-12 DIAGNOSIS — I428 Other cardiomyopathies: Secondary | ICD-10-CM

## 2022-09-12 LAB — CUP PACEART REMOTE DEVICE CHECK
Battery Remaining Longevity: 19 mo
Battery Voltage: 2.92 V
Brady Statistic RV Percent Paced: 0 %
Date Time Interrogation Session: 20240509033624
HighPow Impedance: 73 Ohm
Implantable Lead Connection Status: 753985
Implantable Lead Implant Date: 20141202
Implantable Lead Location: 753860
Implantable Lead Model: 6935
Implantable Pulse Generator Implant Date: 20141202
Lead Channel Impedance Value: 456 Ohm
Lead Channel Impedance Value: 532 Ohm
Lead Channel Pacing Threshold Amplitude: 0.375 V
Lead Channel Pacing Threshold Pulse Width: 0.4 ms
Lead Channel Sensing Intrinsic Amplitude: 6.875 mV
Lead Channel Sensing Intrinsic Amplitude: 6.875 mV
Lead Channel Setting Pacing Amplitude: 2 V
Lead Channel Setting Pacing Pulse Width: 0.4 ms
Lead Channel Setting Sensing Sensitivity: 0.3 mV
Zone Setting Status: 755011

## 2022-09-13 ENCOUNTER — Encounter: Payer: Self-pay | Admitting: *Deleted

## 2022-09-13 LAB — BASIC METABOLIC PANEL
BUN/Creatinine Ratio: 16 (ref 10–24)
BUN: 29 mg/dL — ABNORMAL HIGH (ref 8–27)
CO2: 19 mmol/L — ABNORMAL LOW (ref 20–29)
Calcium: 9.9 mg/dL (ref 8.6–10.2)
Chloride: 99 mmol/L (ref 96–106)
Creatinine, Ser: 1.83 mg/dL — ABNORMAL HIGH (ref 0.76–1.27)
Glucose: 266 mg/dL — ABNORMAL HIGH (ref 70–99)
Potassium: 4.7 mmol/L (ref 3.5–5.2)
Sodium: 136 mmol/L (ref 134–144)
eGFR: 39 mL/min/{1.73_m2} — ABNORMAL LOW (ref >59)

## 2022-09-16 ENCOUNTER — Ambulatory Visit: Payer: Medicare Other | Attending: Cardiology

## 2022-09-16 DIAGNOSIS — I5022 Chronic systolic (congestive) heart failure: Secondary | ICD-10-CM | POA: Diagnosis not present

## 2022-09-16 DIAGNOSIS — Z95810 Presence of automatic (implantable) cardiac defibrillator: Secondary | ICD-10-CM

## 2022-09-20 NOTE — Progress Notes (Signed)
EPIC Encounter for ICM Monitoring  Patient Name: Gary Brady is a 71 y.o. male Date: 09/20/2022 Primary Care Physican: Kaleen Mask, MD Primary Cardiologist: Jens Som Electrophysiologist: Elberta Fortis 07/15/2022 Weight: 222 lbs 08/14/2022 Weight: 231 lbs 09/20/2022 Weight: 225 lbs   Spoke with patient and heart failure questions reviewed.  Transmission results reviewed.  Pt reports he is doing well and is asymptomatic for fluid accumulation.  Confirmed he's taking 40 mg Furosemide daily.    Pt eats foods higher in salt at times and limited cooking at home.    Optivol thoracic impedance suggesting normal fluid levels since 4/29.      Prescribed:  Furosemide 40 mg take take 1 tablet daily.  08/20/2022 Per Dr Ludwig Clarks lab note states for pt to take Lasix 40 mg daily and can additional 40 mg for 2-3 lb weight gain. Klor Con 20 mEq 1 tablet by mouth as directed only take on days that Lasix is taken Spironolactone 20 mg take 0.5 tablet twice a day   Labs:  07/15/2022 Creatinine 1.57, BUN 28, Potassium 4.8, Sodium 137, GFR 47 A complete set of results can be found in Results Review.   Recommendations:   No changes and encouraged to call if experiencing any fluid symptoms.   Follow-up plan: ICM clinic phone appointment on 10/21/2022.  91 day device clinic remote transmission 12/12/2022.     EP/Cardiology Office Visits:   Recall 07/05/2023 with Dr Elberta Fortis.  Recall 01/15/2023 with Dr Jens Som.   Copy of ICM check sent to Dr. Elberta Fortis.  3 month ICM trend: 09/16/2022.    12-14 Month ICM trend:     [name], RN 09/20/2022 4:08 PM

## 2022-10-02 NOTE — Progress Notes (Signed)
Remote ICD transmission.   

## 2022-10-11 ENCOUNTER — Other Ambulatory Visit: Payer: Self-pay | Admitting: Cardiology

## 2022-10-11 DIAGNOSIS — I5022 Chronic systolic (congestive) heart failure: Secondary | ICD-10-CM

## 2022-10-21 ENCOUNTER — Ambulatory Visit: Payer: Medicare Other | Attending: Cardiology

## 2022-10-21 DIAGNOSIS — Z95810 Presence of automatic (implantable) cardiac defibrillator: Secondary | ICD-10-CM

## 2022-10-21 DIAGNOSIS — I5022 Chronic systolic (congestive) heart failure: Secondary | ICD-10-CM

## 2022-10-23 ENCOUNTER — Telehealth: Payer: Self-pay

## 2022-10-23 NOTE — Progress Notes (Signed)
EPIC Encounter for ICM Monitoring  Patient Name: Gary Brady is a 72 y.o. male Date: 10/23/2022 Primary Care Physican: Kaleen Mask, MD Primary Cardiologist: Jens Som Electrophysiologist: Elberta Fortis 07/15/2022 Weight: 222 lbs 08/14/2022 Weight: 231 lbs 09/20/2022 Weight: 225 lbs   Attempted call to patient and unable to reach.  Transmission reviewed.    Pt eats foods higher in salt at times and limited cooking at home.    Optivol thoracic impedance suggesting normal fluid levels with the exception of possible fluid accumulation from 6/10-6/14.      Prescribed:  Furosemide 40 mg take take 1 tablet daily.  08/20/2022 Per Dr Ludwig Clarks lab note states for pt to take Lasix 40 mg daily and can additional 40 mg for 2-3 lb weight gain. Klor Con 20 mEq 1 tablet by mouth as directed only take on days that Lasix is taken Spironolactone 20 mg take 0.5 tablet twice a day   Labs:  07/15/2022 Creatinine 1.57, BUN 28, Potassium 4.8, Sodium 137, GFR 47 A complete set of results can be found in Results Review.   Recommendations:   Unable to reach.     Follow-up plan: ICM clinic phone appointment on 11/25/2022.  91 day device clinic remote transmission 12/12/2022.     EP/Cardiology Office Visits:   Recall 07/05/2023 with Dr Elberta Fortis.  01/15/2023 with Dr Jens Som.   Copy of ICM check sent to Dr. Elberta Fortis.  3 month ICM trend: 10/21/2022.    12-14 Month ICM trend:     [name], RN 10/23/2022 3:44 PM

## 2022-10-23 NOTE — Telephone Encounter (Signed)
Remote ICM transmission received.  Attempted call to patient regarding ICM remote transmission and no answer or voice mail option.  

## 2022-11-13 ENCOUNTER — Other Ambulatory Visit: Payer: Self-pay | Admitting: Cardiology

## 2022-11-17 ENCOUNTER — Other Ambulatory Visit: Payer: Self-pay | Admitting: Cardiology

## 2022-11-25 ENCOUNTER — Ambulatory Visit: Payer: Medicare Other | Attending: Cardiology

## 2022-11-25 DIAGNOSIS — I5022 Chronic systolic (congestive) heart failure: Secondary | ICD-10-CM

## 2022-11-25 DIAGNOSIS — Z95810 Presence of automatic (implantable) cardiac defibrillator: Secondary | ICD-10-CM | POA: Diagnosis not present

## 2022-11-26 ENCOUNTER — Telehealth: Payer: Self-pay

## 2022-11-26 NOTE — Telephone Encounter (Signed)
Remote ICM transmission received.  Attempted call to patient regarding ICM remote transmission and no answer or voice mail option.  

## 2022-11-26 NOTE — Progress Notes (Signed)
EPIC Encounter for ICM Monitoring  Patient Name: Gary Brady is a 72 y.o. male Date: 11/26/2022 Primary Care Physican: Kaleen Mask, MD rimary Cardiologist: Jens Som Electrophysiologist: Elberta Fortis 07/15/2022 Weight: 222 lbs 08/14/2022 Weight: 231 lbs 09/20/2022 Weight: 225 lbs   Attempted call to patient and unable to reach.  Transmission reviewed.    Pt eats foods higher in salt at times and limited cooking at home.    Optivol thoracic impedance suggesting intermittent days with possible fluid accumulation within the last month.      Prescribed:  Furosemide 40 mg take take 1 tablet daily.  08/20/2022 Per Dr Ludwig Clarks lab note states for pt to take Lasix 40 mg daily and can additional 40 mg for 2-3 lb weight gain. Klor Con 20 mEq 1 tablet by mouth as directed only take on days that Lasix is taken Spironolactone 20 mg take 0.5 tablet twice a day   Labs: 09/12/2022 Creatinine 1.83, BUN 29, Potassium 4.7, Sodium 136, GFR 39  08/19/2022 Creatinine 2.22, BUN 46, Potassium 5.0, Sodium 136, GFR 31  07/15/2022 Creatinine 1.57, BUN 28, Potassium 4.8, Sodium 137, GFR 47 A complete set of results can be found in Results Review.   Recommendations:   Unable to reach.     Follow-up plan: ICM clinic phone appointment on 12/30/2022.  91 day device clinic remote transmission 12/12/2022.     EP/Cardiology Office Visits:   Recall 07/05/2023 with Dr Elberta Fortis.  01/15/2023 with Dr Jens Som.   Copy of ICM check sent to Dr. Elberta Fortis.  3 month ICM trend: 11/25/2022.    12-14 Month ICM trend:     [name], RN 11/26/2022 1:26 PM

## 2022-12-12 ENCOUNTER — Ambulatory Visit (INDEPENDENT_AMBULATORY_CARE_PROVIDER_SITE_OTHER): Payer: Medicare Other

## 2022-12-12 DIAGNOSIS — I5022 Chronic systolic (congestive) heart failure: Secondary | ICD-10-CM

## 2022-12-12 DIAGNOSIS — I428 Other cardiomyopathies: Secondary | ICD-10-CM

## 2022-12-30 ENCOUNTER — Ambulatory Visit: Payer: Medicare Other | Attending: Cardiology

## 2022-12-30 DIAGNOSIS — Z95810 Presence of automatic (implantable) cardiac defibrillator: Secondary | ICD-10-CM

## 2022-12-30 DIAGNOSIS — I5022 Chronic systolic (congestive) heart failure: Secondary | ICD-10-CM
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
## 2023-01-09 NOTE — Progress Notes (Signed)
EPIC Encounter for ICM Monitoring  Patient Name: Gary Brady is a 72 y.o. male Date: 01/09/2023 Primary Care Physican: Kaleen Mask, MD Primary Cardiologist: Jens Som Electrophysiologist: Elberta Fortis 07/15/2022 Weight: 222 lbs 08/14/2022 Weight: 231 lbs 09/20/2022 Weight: 225 lbs   Transmission reviewed.    Pt eats foods higher in salt at times and limited cooking at home.    Optivol thoracic impedance suggesting intermittent days with possible fluid accumulation within the last month.      Prescribed:  Furosemide 40 mg take take 1 tablet daily.  08/20/2022 Per Dr Ludwig Clarks lab note states for pt to take Lasix 40 mg daily and can additional 40 mg for 2-3 lb weight gain. Klor Con 20 mEq 1 tablet by mouth as directed only take on days that Lasix is taken Spironolactone 20 mg take 0.5 tablet twice a day   Labs: 09/12/2022 Creatinine 1.83, BUN 29, Potassium 4.7, Sodium 136, GFR 39  08/19/2022 Creatinine 2.22, BUN 46, Potassium 5.0, Sodium 136, GFR 31  07/15/2022 Creatinine 1.57, BUN 28, Potassium 4.8, Sodium 137, GFR 47 A complete set of results can be found in Results Review.   Recommendations:   No changes.      Follow-up plan: ICM clinic phone appointment on 02/10/2023.  91 day device clinic remote transmission 03/13/2023.     EP/Cardiology Office Visits:   Recall 07/05/2023 with Dr Elberta Fortis.  01/15/2023 with Dr Jens Som.   Copy of ICM check sent to Dr. Elberta Fortis.  3 month ICM trend: 01/02/2023.    12-14 Month ICM trend:     [name], RN 01/09/2023 4:48 PM

## 2023-01-15 ENCOUNTER — Ambulatory Visit: Payer: Medicare Other | Attending: Cardiology | Admitting: Cardiology

## 2023-01-15 ENCOUNTER — Encounter: Payer: Self-pay | Admitting: Cardiology

## 2023-01-15 VITALS — BP 92/60 | HR 70 | Ht 68.0 in | Wt 226.6 lb

## 2023-01-15 DIAGNOSIS — I5022 Chronic systolic (congestive) heart failure: Secondary | ICD-10-CM | POA: Insufficient documentation

## 2023-01-15 DIAGNOSIS — I10 Essential (primary) hypertension: Secondary | ICD-10-CM | POA: Insufficient documentation

## 2023-01-15 DIAGNOSIS — I428 Other cardiomyopathies: Secondary | ICD-10-CM | POA: Insufficient documentation

## 2023-01-15 DIAGNOSIS — I2510 Atherosclerotic heart disease of native coronary artery without angina pectoris: Secondary | ICD-10-CM | POA: Insufficient documentation

## 2023-01-15 DIAGNOSIS — E7800 Pure hypercholesterolemia, unspecified: Secondary | ICD-10-CM | POA: Insufficient documentation

## 2023-01-15 DIAGNOSIS — Z95810 Presence of automatic (implantable) cardiac defibrillator: Secondary | ICD-10-CM | POA: Insufficient documentation

## 2023-01-15 LAB — BASIC METABOLIC PANEL
BUN/Creatinine Ratio: 16 (ref 10–24)
BUN: 33 mg/dL — ABNORMAL HIGH (ref 8–27)
CO2: 23 mmol/L (ref 20–29)
Calcium: 10.2 mg/dL (ref 8.6–10.2)
Chloride: 97 mmol/L (ref 96–106)
Creatinine, Ser: 2.1 mg/dL — ABNORMAL HIGH (ref 0.76–1.27)
Glucose: 315 mg/dL — ABNORMAL HIGH (ref 70–99)
Potassium: 4.9 mmol/L (ref 3.5–5.2)
Sodium: 134 mmol/L (ref 134–144)
eGFR: 33 mL/min/{1.73_m2} — ABNORMAL LOW (ref >59)

## 2023-01-15 MED ORDER — CARVEDILOL 12.5 MG PO TABS: 12.5000 mg | ORAL_TABLET | Freq: Two times a day (BID) | ORAL | 3 refills | Status: DC

## 2023-01-15 NOTE — Patient Instructions (Signed)
Medication Instructions:   DECREASE CARVEDILOL TO 12.5 MG TWICE DAILY= 1/2 OF THE 25 MG TABLET TWICE DAILY  *If you need a refill on your cardiac medications before your next appointment, please call your pharmacy*   Follow-Up: At [facility], you and your health needs are our priority.  As part of our continuing mission to provide you with exceptional heart care, we have created designated Provider Care Teams.  These Care Teams include your primary Cardiologist (physician) and Advanced Practice Providers (APPs -  Physician Assistants and Nurse Practitioners) who all work together to provide you with the care you need, when you need it.  We recommend signing up for the patient portal called "MyChart".  Sign up information is provided on this After Visit Summary.  MyChart is used to connect with patients for Virtual Visits (Telemedicine).  Patients are able to view lab/test results, encounter notes, upcoming appointments, etc.  Non-urgent messages can be sent to your provider as well.   To learn more about what you can do with MyChart, go to [email or web address].    Your next appointment:   6 month(s)  Provider:   [name], MD

## 2023-01-16 ENCOUNTER — Other Ambulatory Visit: Payer: Self-pay | Admitting: *Deleted

## 2023-01-16 DIAGNOSIS — N289 Disorder of kidney and ureter, unspecified: Secondary | ICD-10-CM

## 2023-01-28 NOTE — Progress Notes (Signed)
Remote ICD transmission.   

## 2023-02-10 ENCOUNTER — Ambulatory Visit (INDEPENDENT_AMBULATORY_CARE_PROVIDER_SITE_OTHER): Payer: Medicare Other

## 2023-02-10 DIAGNOSIS — Z95810 Presence of automatic (implantable) cardiac defibrillator: Secondary | ICD-10-CM | POA: Diagnosis not present

## 2023-02-10 DIAGNOSIS — I5022 Chronic systolic (congestive) heart failure: Secondary | ICD-10-CM | POA: Diagnosis not present

## 2023-02-18 NOTE — Progress Notes (Signed)
EPIC Encounter for ICM Monitoring  Patient Name: Gary Brady is a 72 y.o. male Date: 02/18/2023 Primary Care Physican: Kaleen Mask, MD Primary Cardiologist: Jens Som Electrophysiologist: Elberta Fortis 07/15/2022 Weight: 222 lbs 08/14/2022 Weight: 231 lbs 09/20/2022 Weight: 225 lbs   Transmission reviewed.    Pt eats foods higher in salt at times and limited cooking at home.    Optivol thoracic impedance suggesting intermittent days with possible fluid accumulation within the last month.      Prescribed:  Furosemide 40 mg take take 1 tablet daily.  08/20/2022 Per Dr Ludwig Clarks lab note states for pt to take Lasix 40 mg daily and can additional 40 mg for 2-3 lb weight gain. Klor Con 20 mEq 1 tablet by mouth as directed only take on days that Lasix is taken Spironolactone 20 mg take 0.5 tablet twice a day   Labs: 09/12/2022 Creatinine 1.83, BUN 29, Potassium 4.7, Sodium 136, GFR 39  08/19/2022 Creatinine 2.22, BUN 46, Potassium 5.0, Sodium 136, GFR 31  07/15/2022 Creatinine 1.57, BUN 28, Potassium 4.8, Sodium 137, GFR 47 A complete set of results can be found in Results Review.   Recommendations:   No changes.      Follow-up plan: ICM clinic phone appointment on 03/17/2023.  91 day device clinic remote transmission 03/13/2023.     EP/Cardiology Office Visits:   Recall 07/05/2023 with Dr Elberta Fortis.  01/15/2023 with Dr Jens Som.   Copy of ICM check sent to Dr. Elberta Fortis.  3 month ICM trend: 02/10/2023.    12-14 Month ICM trend:     [name], RN 02/18/2023 4:26 PM

## 2023-03-04 ENCOUNTER — Telehealth: Payer: Self-pay | Admitting: *Deleted

## 2023-03-04 NOTE — Telephone Encounter (Signed)
Spoke with pt, aware patient assistance for entresto has been faxed to the company.

## 2023-03-13 ENCOUNTER — Ambulatory Visit (INDEPENDENT_AMBULATORY_CARE_PROVIDER_SITE_OTHER): Payer: Medicare Other

## 2023-03-13 DIAGNOSIS — I428 Other cardiomyopathies: Secondary | ICD-10-CM

## 2023-03-13 LAB — CUP PACEART REMOTE DEVICE CHECK
Battery Remaining Longevity: 15 mo
Battery Voltage: 2.91 V
Brady Statistic RV Percent Paced: 0.01 %
Date Time Interrogation Session: 20241107012505
HighPow Impedance: 96 Ohm
Implantable Lead Connection Status: 753985
Implantable Lead Implant Date: 20141202
Implantable Lead Location: 753860
Implantable Lead Model: 6935
Implantable Pulse Generator Implant Date: 20141202
Lead Channel Impedance Value: 494 Ohm
Lead Channel Impedance Value: 589 Ohm
Lead Channel Pacing Threshold Amplitude: 0.375 V
Lead Channel Pacing Threshold Pulse Width: 0.4 ms
Lead Channel Sensing Intrinsic Amplitude: 7 mV
Lead Channel Sensing Intrinsic Amplitude: 7 mV
Lead Channel Setting Pacing Amplitude: 2 V
Lead Channel Setting Pacing Pulse Width: 0.4 ms
Lead Channel Setting Sensing Sensitivity: 0.3 mV
Zone Setting Status: 755011

## 2023-03-17 ENCOUNTER — Ambulatory Visit: Payer: Medicare Other | Attending: Cardiology

## 2023-03-17 DIAGNOSIS — I5022 Chronic systolic (congestive) heart failure: Secondary | ICD-10-CM

## 2023-03-17 DIAGNOSIS — Z95810 Presence of automatic (implantable) cardiac defibrillator: Secondary | ICD-10-CM | POA: Diagnosis not present

## 2023-03-24 NOTE — Progress Notes (Signed)
EPIC Encounter for ICM Monitoring  Patient Name: Gary Brady is a 72 y.o. male Date: 03/24/2023 Primary Care Physican: Kaleen Mask, MD Primary Cardiologist: Jens Som Electrophysiologist: Elberta Fortis 07/15/2022 Weight: 222 lbs 08/14/2022 Weight: 231 lbs 09/20/2022 Weight: 225 lbs   Transmission reviewed.    Pt eats foods higher in salt at times and limited cooking at home.    Optivol thoracic impedance suggesting normal fluid levels since 10/28.      Prescribed:  Furosemide 40 mg take take 1 tablet daily.  08/20/2022 Per Dr Ludwig Clarks lab note states for pt to take Lasix 40 mg daily and can additional 40 mg for 2-3 lb weight gain. Klor Con 20 mEq 1 tablet by mouth as directed only take on days that Lasix is taken Spironolactone 20 mg take 0.5 tablet twice a day   Labs: 09/12/2022 Creatinine 1.83, BUN 29, Potassium 4.7, Sodium 136, GFR 39  08/19/2022 Creatinine 2.22, BUN 46, Potassium 5.0, Sodium 136, GFR 31  07/15/2022 Creatinine 1.57, BUN 28, Potassium 4.8, Sodium 137, GFR 47 A complete set of results can be found in Results Review.   Recommendations:   No changes.      Follow-up plan: ICM clinic phone appointment on 06/28/2022.  91 day device clinic remote transmission 06/12/2023.     EP/Cardiology Office Visits:   Recall 07/05/2023 with Dr Elberta Fortis.  01/15/2023 with Dr Jens Som.   Copy of ICM check sent to Dr. Elberta Fortis.  3 month ICM trend: 03/17/2023.    12-14 Month ICM trend:     [name], RN 03/24/2023 4:50 PM

## 2023-03-31 NOTE — Progress Notes (Signed)
Remote ICD transmission.   

## 2023-04-25 ENCOUNTER — Other Ambulatory Visit: Payer: Self-pay | Admitting: Cardiology

## 2023-04-28 ENCOUNTER — Ambulatory Visit: Payer: Medicare Other | Attending: Cardiology

## 2023-04-28 DIAGNOSIS — Z95810 Presence of automatic (implantable) cardiac defibrillator: Secondary | ICD-10-CM

## 2023-04-28 DIAGNOSIS — I5022 Chronic systolic (congestive) heart failure: Secondary | ICD-10-CM

## 2023-05-02 NOTE — Progress Notes (Signed)
EPIC Encounter for ICM Monitoring  Patient Name: Gary Brady is a 72 y.o. male Date: 05/02/2023 Primary Care Physican: Kaleen Mask, MD Primary Cardiologist: Jens Som Electrophysiologist: Elberta Fortis 07/15/2022 Weight: 222 lbs 08/14/2022 Weight: 231 lbs 09/20/2022 Weight: 225 lbs 05/01/2024 Weight: 209 lbs   Spoke with patient and heart failure questions reviewed.  Transmission results reviewed.  Pt asymptomatic for fluid accumulation.  PCP told is prescribing Marcelline Deist since his A1C is around 10%.  He reports he as on a cruise during decreased impedance.    Pt eats foods higher in salt at times and limited cooking at home.    Optivol thoracic impedance suggesting normal fluid levels since 12/2.      Prescribed:  Furosemide 40 mg take take 1 tablet daily.  08/20/2022 Per Dr Ludwig Clarks lab note states for pt to take Lasix 40 mg daily and can additional 40 mg for 2-3 lb weight gain. Klor Con 20 mEq 1 tablet by mouth as directed only take on days that Lasix is taken Spironolactone 20 mg take 0.5 tablet twice a day   Labs: 09/12/2022 Creatinine 1.83, BUN 29, Potassium 4.7, Sodium 136, GFR 39  08/19/2022 Creatinine 2.22, BUN 46, Potassium 5.0, Sodium 136, GFR 31  07/15/2022 Creatinine 1.57, BUN 28, Potassium 4.8, Sodium 137, GFR 47 A complete set of results can be found in Results Review.   Recommendations:   No changes and encouraged to call if experiencing any fluid symptoms.   Follow-up plan: ICM clinic phone appointment on 06/02/2023.  91 day device clinic remote transmission 06/12/2023.     EP/Cardiology Office Visits:   Recall 07/05/2023 with Dr Elberta Fortis.  Recall 07/14/2023 with Dr Jens Som.   Copy of ICM check sent to Dr. Elberta Fortis.  3 month ICM trend: 04/28/2023.    12-14 Month ICM trend:     [name], RN 05/02/2023 3:43 PM

## 2023-06-02 ENCOUNTER — Ambulatory Visit: Payer: Medicare Other | Attending: Cardiology

## 2023-06-02 DIAGNOSIS — I5022 Chronic systolic (congestive) heart failure: Secondary | ICD-10-CM | POA: Diagnosis not present

## 2023-06-02 DIAGNOSIS — Z95810 Presence of automatic (implantable) cardiac defibrillator: Secondary | ICD-10-CM

## 2023-06-06 NOTE — Progress Notes (Signed)
EPIC Encounter for ICM Monitoring  Patient Name: Gary Brady is a 73 y.o. male Date: 06/06/2023 Primary Care Physican: Kaleen Mask, MD Primary Cardiologist: Jens Som Electrophysiologist: Elberta Fortis 07/15/2022 Weight: 222 lbs 08/14/2022 Weight: 231 lbs 09/20/2022 Weight: 225 lbs 05/01/2024 Weight: 209 lbs  Battery Longevity: 8 months   Transmission results reviewed.     Pt eats foods higher in salt at times and limited cooking at home.    Optivol thoracic impedance suggesting intermittent days with possible fluid accumulation within the last month.      Prescribed:  Furosemide 40 mg take take 1 tablet daily.  08/20/2022 Per Dr Ludwig Clarks lab note states for pt to take Lasix 40 mg daily and can additional 40 mg for 2-3 lb weight gain. Klor Con 20 mEq 1 tablet by mouth as directed only take on days that Lasix is taken Spironolactone 20 mg take 0.5 tablet twice a day   Labs: 09/12/2022 Creatinine 1.83, BUN 29, Potassium 4.7, Sodium 136, GFR 39  08/19/2022 Creatinine 2.22, BUN 46, Potassium 5.0, Sodium 136, GFR 31  07/15/2022 Creatinine 1.57, BUN 28, Potassium 4.8, Sodium 137, GFR 47 A complete set of results can be found in Results Review.   Recommendations:   No changes.   Follow-up plan: ICM clinic phone appointment on 07/07/2023.  91 day device clinic remote transmission 06/12/2023.     EP/Cardiology Office Visits:   Recall 07/05/2023 with Dr Elberta Fortis.  Recall 07/14/2023 with Dr Jens Som.   Copy of ICM check sent to Dr. Elberta Fortis.  3 month ICM trend: 06/02/2023.    12-14 Month ICM trend:     [name], RN 06/06/2023 4:34 PM

## 2023-06-12 ENCOUNTER — Ambulatory Visit (INDEPENDENT_AMBULATORY_CARE_PROVIDER_SITE_OTHER): Payer: Medicare Other

## 2023-06-12 DIAGNOSIS — I428 Other cardiomyopathies: Secondary | ICD-10-CM | POA: Diagnosis not present

## 2023-06-12 LAB — CUP PACEART REMOTE DEVICE CHECK
Battery Remaining Longevity: 12 mo
Battery Voltage: 2.9 V
Brady Statistic RV Percent Paced: 0.01 %
Date Time Interrogation Session: 20250206022605
HighPow Impedance: 97 Ohm
Implantable Lead Connection Status: 753985
Implantable Lead Implant Date: 20141202
Implantable Lead Location: 753860
Implantable Lead Model: 6935
Implantable Pulse Generator Implant Date: 20141202
Lead Channel Impedance Value: 513 Ohm
Lead Channel Impedance Value: 589 Ohm
Lead Channel Pacing Threshold Amplitude: 0.5 V
Lead Channel Pacing Threshold Pulse Width: 0.4 ms
Lead Channel Sensing Intrinsic Amplitude: 6.5 mV
Lead Channel Sensing Intrinsic Amplitude: 6.5 mV
Lead Channel Setting Pacing Amplitude: 2 V
Lead Channel Setting Pacing Pulse Width: 0.4 ms
Lead Channel Setting Sensing Sensitivity: 0.3 mV
Zone Setting Status: 755011

## 2023-06-23 ENCOUNTER — Other Ambulatory Visit: Payer: Self-pay | Admitting: Nephrology

## 2023-06-23 DIAGNOSIS — N183 Chronic kidney disease, stage 3 unspecified: Secondary | ICD-10-CM

## 2023-06-27 ENCOUNTER — Ambulatory Visit
Admission: RE | Admit: 2023-06-27 | Discharge: 2023-06-27 | Disposition: A | Discharge status: Home / Self Care | Payer: Medicare Other | Source: Ambulatory Visit | Attending: Nephrology | Admitting: Nephrology

## 2023-06-27 DIAGNOSIS — N183 Chronic kidney disease, stage 3 unspecified: Secondary | ICD-10-CM

## 2023-07-04 ENCOUNTER — Other Ambulatory Visit: Payer: Self-pay | Admitting: Nephrology

## 2023-07-04 DIAGNOSIS — N1832 Chronic kidney disease, stage 3b: Secondary | ICD-10-CM

## 2023-07-07 ENCOUNTER — Ambulatory Visit: Payer: Medicare Other | Attending: Cardiology

## 2023-07-07 DIAGNOSIS — I5022 Chronic systolic (congestive) heart failure: Secondary | ICD-10-CM | POA: Diagnosis not present

## 2023-07-07 DIAGNOSIS — Z95810 Presence of automatic (implantable) cardiac defibrillator: Secondary | ICD-10-CM

## 2023-07-08 NOTE — Progress Notes (Signed)
 EPIC Encounter for ICM Monitoring  Patient Name: Gary Brady is a 73 y.o. male Date: 07/08/2023 Primary Care Physican: Kaleen Mask, MD Primary Cardiologist: Jens Som Electrophysiologist: Elberta Fortis 07/15/2022 Weight: 222 lbs 08/14/2022 Weight: 231 lbs 09/20/2022 Weight: 225 lbs 05/01/2024 Weight: 209 lbs 07/08/2023 Weight: 198 lbs   Battery Longevity: 13 months   Spoke with patient and heart failure questions reviewed.  Transmission results reviewed.  Pt reports swelling in feet in ankles and 2 weeks ago, he self adjusted Furosemide to 2 tablets (80 mg) daily.   Nephrologist ordered MRI on 3/26    Pt eats foods higher in salt at times and limited cooking at home.    Optivol thoracic impedance suggesting possible fluid accumulation starting 2/7.   Fluid index greater than normal threshold starting 2/23.   Prescribed:  Furosemide 40 mg take take 1 tablet daily.  08/20/2022 Per Dr Ludwig Clarks lab note states for pt to take Lasix 40 mg daily and can additional 40 mg for 2-3 lb weight gain. Klor Con 20 mEq 1 tablet by mouth as directed only take on days that Lasix is taken Spironolactone 20 mg take 0.5 tablet twice a day   Labs: 09/12/2022 Creatinine 1.83, BUN 29, Potassium 4.7, Sodium 136, GFR 39  08/19/2022 Creatinine 2.22, BUN 46, Potassium 5.0, Sodium 136, GFR 31  07/15/2022 Creatinine 1.57, BUN 28, Potassium 4.8, Sodium 137, GFR 47 A complete set of results can be found in Results Review.   Recommendations:   Pt reports Nephrologist is aware that he is taking Furosemide 80 mg daily and discussed the importance of physicians knowing how much diuretic is taking due to effects on Kidneys and he understand.    Follow-up plan: ICM clinic phone appointment on 07/15/2023 to recheck fluid levels.  91 day device clinic remote transmission 09/11/2023.     EP/Cardiology Office Visits:   Recall 07/05/2023 with Dr Elberta Fortis.  Recall 07/14/2023 with Dr Jens Som (6 month f/u).   Copy of ICM check  sent to Dr. Elberta Fortis and Dr Jens Som for [facility] and review.  3 month ICM trend: 07/07/2023.    12-14 Month ICM trend:     [name], RN 07/08/2023 1:40 PM

## 2023-07-15 ENCOUNTER — Ambulatory Visit: Attending: Cardiology

## 2023-07-15 DIAGNOSIS — Z95810 Presence of automatic (implantable) cardiac defibrillator: Secondary | ICD-10-CM

## 2023-07-15 DIAGNOSIS — I5022 Chronic systolic (congestive) heart failure: Secondary | ICD-10-CM

## 2023-07-15 NOTE — Progress Notes (Signed)
 EPIC Encounter for ICM Monitoring  Patient Name: Gary Brady is a 73 y.o. male Date: 07/15/2023 Primary Care Physican: Kaleen Mask, MD Primary Cardiologist: Jens Som Electrophysiologist: Elberta Fortis 07/15/2022 Weight: 222 lbs 08/14/2022 Weight: 231 lbs 09/20/2022 Weight: 225 lbs 05/01/2024 Weight: 209 lbs 07/08/2023 Weight: 198 lbs 198 lbs   Battery Longevity: 12 months   Spoke with patient and heart failure questions reviewed.  Transmission results reviewed.  Pt is asymptomatic for fluid accumulation.  Pt is unsure why fluid accumulation is showing on device report.  Nephrologist ordered MRI on 3/26.    Pt eats foods higher in salt at times and limited cooking at home.    Optivol thoracic impedance suggesting minimal improvement after taking Lasix 80 mg daily instead of prescribed 40 mg and continues to suggest ongoing possible fluid accumulation starting 2/7.   Fluid index greater than normal threshold starting 2/23.   Prescribed:  Furosemide 40 mg take take 1 tablet daily.  08/20/2022 Per Dr Ludwig Clarks lab note states for pt to take Lasix 40 mg daily and can additional 40 mg for 2-3 lb weight gain.  3/11 Pt reports taking differently:  80  mg daily and he says Nephrologist is aware it is taking that dosage.   Klor Con 20 mEq 1 tablet by mouth as directed only take on days that Lasix is taken Spironolactone 20 mg take 0.5 tablet twice a day   Labs: 09/12/2022 Creatinine 1.83, BUN 29, Potassium 4.7, Sodium 136, GFR 39  08/19/2022 Creatinine 2.22, BUN 46, Potassium 5.0, Sodium 136, GFR 31  07/15/2022 Creatinine 1.57, BUN 28, Potassium 4.8, Sodium 137, GFR 47 A complete set of results can be found in Results Review.   Recommendations:  Pt is taking Lasix 80 mg daily and possible fluid accumulation continues.  Advised to call the offices of Dr Elberta Fortis and Dr Jens Som to schedule appointments that are due this month.  He requested I leave numbers on his voice but voice mail box is  full.     Follow-up plan: ICM clinic phone appointment on 07/28/2023 to recheck fluid levels.  91 day device clinic remote transmission 09/11/2023.     EP/Cardiology Office Visits:   Recall 07/05/2023 with Dr Elberta Fortis.  Recall 07/14/2023 with Dr Jens Som (6 month f/u).   Copy of ICM check sent to Dr. Elberta Fortis and Dr Jens Som for [facility] and review.  3 month ICM trend: 07/15/2023.    12-14 Month ICM trend:     [name], RN 07/15/2023 3:16 PM

## 2023-07-17 NOTE — Progress Notes (Signed)
 Remote ICD transmission.

## 2023-07-28 ENCOUNTER — Ambulatory Visit: Attending: Cardiology

## 2023-07-28 DIAGNOSIS — Z95810 Presence of automatic (implantable) cardiac defibrillator: Secondary | ICD-10-CM

## 2023-07-28 DIAGNOSIS — I5022 Chronic systolic (congestive) heart failure: Secondary | ICD-10-CM

## 2023-07-29 NOTE — Progress Notes (Signed)
 EPIC Encounter for ICM Monitoring  Patient Name: Gary Brady is a 73 y.o. male Date: 07/29/2023 Primary Care Physican: Kaleen Mask, MD Primary Cardiologist: Jens Som Electrophysiologist: Elberta Fortis 07/15/2022 Weight: 222 lbs 08/14/2022 Weight: 231 lbs 09/20/2022 Weight: 225 lbs 05/01/2024 Weight: 209 lbs 07/08/2023 Weight: 198 lbs 07/15/2023 Weight: 198 lbs   Battery Longevity: 11 months   Spoke with patient and heart failure questions reviewed.  Transmission results reviewed.  Pt asymptomatic for fluid accumulation.  He continues to take Lasix 80 mg daily because it helps keep the fluid off.   Nephrologist ordered MRI on 3/26.    Pt eats foods higher in salt at times and limited cooking at home.    Optivol thoracic impedance suggesting fluid levels trending closer to baseline.   Fluid index greater than normal threshold starting 2/23.   Prescribed:  Furosemide 40 mg take take 1 tablet daily.  08/20/2022 Per Dr Ludwig Clarks lab note pt to take Lasix 40 mg daily and can additional 40 mg for 2-3 lb weight gain.  3/11 Pt reports taking differently:  80  mg daily and he says Nephrologist is aware he is taking that dosage.   Klor Con 20 mEq 1 tablet by mouth as directed only take on days that Lasix is taken Spironolactone 20 mg take 0.5 tablet twice a day   Labs: 09/12/2022 Creatinine 1.83, BUN 29, Potassium 4.7, Sodium 136, GFR 39  08/19/2022 Creatinine 2.22, BUN 46, Potassium 5.0, Sodium 136, GFR 31  07/15/2022 Creatinine 1.57, BUN 28, Potassium 4.8, Sodium 137, GFR 47 A complete set of results can be found in Results Review.   Recommendations:  Advised again to confirm with nephrologist he is aware he is taking lasix 80 mg daily.   Also advised to call offices of Dr Elberta Fortis and Dr Jens Som to schedule appointments.    Follow-up plan: ICM clinic phone appointment on 08/18/2023.  91 day device clinic remote transmission 09/11/2023.     EP/Cardiology Office Visits:   Recall 07/05/2023 with  Dr Elberta Fortis.  Recall 07/14/2023 with Dr Jens Som (6 month f/u).   Copy of ICM check sent to Dr. Elberta Fortis.  3 month ICM trend: 07/28/2023.    12-14 Month ICM trend:     [name], RN 07/29/2023 8:17 AM

## 2023-07-30 ENCOUNTER — Ambulatory Visit
Admission: RE | Admit: 2023-07-30 | Discharge: 2023-07-30 | Disposition: A | Discharge status: Home / Self Care | Payer: Medicare Other | Source: Ambulatory Visit | Attending: Nephrology | Admitting: Nephrology

## 2023-07-30 DIAGNOSIS — N1832 Chronic kidney disease, stage 3b: Secondary | ICD-10-CM

## 2023-08-11 ENCOUNTER — Encounter

## 2023-08-18 ENCOUNTER — Ambulatory Visit: Attending: Cardiology

## 2023-08-18 DIAGNOSIS — I5022 Chronic systolic (congestive) heart failure: Secondary | ICD-10-CM

## 2023-08-18 DIAGNOSIS — Z95810 Presence of automatic (implantable) cardiac defibrillator: Secondary | ICD-10-CM | POA: Diagnosis not present

## 2023-08-18 NOTE — Progress Notes (Signed)
 EPIC Encounter for ICM Monitoring  Patient Name: Gary Brady is a 73 y.o. male Date: 08/18/2023 Primary Care Physican: Candiss Chamorro, MD Primary Cardiologist: Audery Blazing Electrophysiologist: Lawana Pray 07/15/2022 Weight: 222 lbs 08/14/2022 Weight: 231 lbs 09/20/2022 Weight: 225 lbs 05/01/2024 Weight: 209 lbs 07/08/2023 Weight: 198 lbs 07/15/2023 Weight: 198 lbs   Battery Longevity: 10 months   Spoke with patient and heart failure questions reviewed.  Transmission results reviewed.  Pt asymptomatic for fluid accumulation.  Asked if he has any fluid symptoms if he takes the prescribed 40 mg daily instead of 80 mg and he stated no.      Pt eats foods higher in salt at times and limited cooking at home.    Optivol thoracic impedance fluid levels is worse since 3/27 suggesting possible fluid accumulation.   Fluid index greater than normal threshold starting 2/23.  Optivol does not correlate with any fluid symptoms at this time.    Prescribed:  Furosemide 40 mg take take 1 tablet daily.  08/20/2022 Per Dr Lourdes Roy lab note pt to take Lasix 40 mg daily and can additional 40 mg for 2-3 lb weight gain.       Klor Con 20 mEq 1 tablet by mouth as directed only take on days that Lasix is taken Spironolactone 20 mg take 0.5 tablet twice a day   Labs: 09/12/2022 Creatinine 1.83, BUN 29, Potassium 4.7, Sodium 136, GFR 39  08/19/2022 Creatinine 2.22, BUN 46, Potassium 5.0, Sodium 136, GFR 31  07/15/2022 Creatinine 1.57, BUN 28, Potassium 4.8, Sodium 137, GFR 47 A complete set of results can be found in Results Review.   Recommendations:  Advised to take Furosemide as prescribed which is 40 mg daily and call if he begins to experience any fluid symptoms.      Follow-up plan: ICM clinic phone appointment on 09/22/2023.  91 day device clinic remote transmission 09/11/2023.     EP/Cardiology Office Visits:   09/15/2023 with Dr Lawana Pray.  11/10/2023 with Dr Audery Blazing (6 month f/u).   Copy of ICM check sent  to Dr. Lawana Pray and Dr Audery Blazing for [facility].  3 month ICM trend: 08/18/2023.    12-14 Month ICM trend:     Almyra Jain, RN 08/18/2023 3:12 PM

## 2023-09-11 ENCOUNTER — Other Ambulatory Visit: Payer: Self-pay | Admitting: Cardiology

## 2023-09-11 ENCOUNTER — Ambulatory Visit (INDEPENDENT_AMBULATORY_CARE_PROVIDER_SITE_OTHER): Payer: Medicare Other

## 2023-09-11 ENCOUNTER — Telehealth: Payer: Self-pay | Admitting: *Deleted

## 2023-09-11 DIAGNOSIS — I428 Other cardiomyopathies: Secondary | ICD-10-CM | POA: Diagnosis not present

## 2023-09-11 DIAGNOSIS — I5022 Chronic systolic (congestive) heart failure: Secondary | ICD-10-CM

## 2023-09-11 LAB — CUP PACEART REMOTE DEVICE CHECK
Battery Remaining Longevity: 11 mo
Battery Voltage: 2.9 V
Brady Statistic RV Percent Paced: 0.01 %
Date Time Interrogation Session: 20250508033323
HighPow Impedance: 63 Ohm
Implantable Lead Connection Status: 753985
Implantable Lead Implant Date: 20141202
Implantable Lead Location: 753860
Implantable Lead Model: 6935
Implantable Pulse Generator Implant Date: 20141202
Lead Channel Impedance Value: 456 Ohm
Lead Channel Impedance Value: 494 Ohm
Lead Channel Pacing Threshold Amplitude: 0.375 V
Lead Channel Pacing Threshold Pulse Width: 0.4 ms
Lead Channel Sensing Intrinsic Amplitude: 4.625 mV
Lead Channel Sensing Intrinsic Amplitude: 4.625 mV
Lead Channel Setting Pacing Amplitude: 2 V
Lead Channel Setting Pacing Pulse Width: 0.4 ms
Lead Channel Setting Sensing Sensitivity: 0.3 mV
Zone Setting Status: 755011

## 2023-09-11 NOTE — Telephone Encounter (Addendum)
 Device alert received from [employer]:  ICD scheduled remote reviewed. Normal device function.  Presenting rhythm: VS No new events since last 91 day transmission from 06/12/23. HF diagnostics currently abnormal. Routing to triage per protocol.   Spoke with patient regarding abnormal Optivol readings Patient denied any SOB, wheezing, dyspnea, or wet sounding coughs Patient stated his legs were swollen pretty bad a week ago, but that they have returned to their baseline as of 5/8 Patient stated he has been compliant with all of his prescribed medications  Patient stated that he was taking Ozempic for about 6 months for weight management and successfully lost about 35lbs, but had to stop about a month or so ago because of medication cost and affordability and has since gained a few pounds back Patient has in-clinic device check next week on 09/15/23 and OPTIVOL will be reset Routed to Jolan Natal, RN for review

## 2023-09-11 NOTE — Telephone Encounter (Signed)
 Discussed with Caleen Catalan, RN Device clinic regarding resetting Optivol since chronically abnormal since abnormal since Feb and pt has expressed minimal fluid symptoms.  Will be reset at 5/12 OV.

## 2023-09-15 ENCOUNTER — Encounter: Payer: Self-pay | Admitting: Cardiology

## 2023-09-15 ENCOUNTER — Other Ambulatory Visit: Payer: Self-pay | Admitting: Cardiology

## 2023-09-15 ENCOUNTER — Ambulatory Visit: Attending: Cardiology | Admitting: Cardiology

## 2023-09-15 VITALS — BP 134/80 | HR 58 | Ht 68.0 in | Wt 202.0 lb

## 2023-09-15 DIAGNOSIS — D6869 Other thrombophilia: Secondary | ICD-10-CM | POA: Insufficient documentation

## 2023-09-15 DIAGNOSIS — I2510 Atherosclerotic heart disease of native coronary artery without angina pectoris: Secondary | ICD-10-CM | POA: Insufficient documentation

## 2023-09-15 DIAGNOSIS — I5022 Chronic systolic (congestive) heart failure: Secondary | ICD-10-CM | POA: Insufficient documentation

## 2023-09-15 DIAGNOSIS — I428 Other cardiomyopathies: Secondary | ICD-10-CM | POA: Insufficient documentation

## 2023-09-15 LAB — CUP PACEART INCLINIC DEVICE CHECK
Battery Remaining Longevity: 11 mo
Battery Voltage: 2.9 V
Brady Statistic RV Percent Paced: 0.01 %
Date Time Interrogation Session: 20250512140311
HighPow Impedance: 80 Ohm
Implantable Lead Connection Status: 753985
Implantable Lead Implant Date: 20141202
Implantable Lead Location: 753860
Implantable Lead Model: 6935
Implantable Pulse Generator Implant Date: 20141202
Lead Channel Impedance Value: 494 Ohm
Lead Channel Impedance Value: 570 Ohm
Lead Channel Pacing Threshold Amplitude: 0.375 V
Lead Channel Pacing Threshold Pulse Width: 0.4 ms
Lead Channel Sensing Intrinsic Amplitude: 5.75 mV
Lead Channel Sensing Intrinsic Amplitude: 6.375 mV
Lead Channel Setting Pacing Amplitude: 2 V
Lead Channel Setting Pacing Pulse Width: 0.4 ms
Lead Channel Setting Sensing Sensitivity: 0.3 mV
Zone Setting Status: 755011

## 2023-09-15 NOTE — Patient Instructions (Signed)
 Medication Instructions:  Your physician recommends that you continue on your current medications as directed. Please refer to the Current Medication list given to you today.  *If you need a refill on your cardiac medications before your next appointment, please call your pharmacy*   Follow-Up: At [facility], you and your health needs are our priority.  As part of our continuing mission to provide you with exceptional heart care, our providers are all part of one team.  This team includes your primary Cardiologist (physician) and Advanced Practice Providers or APPs (Physician Assistants and Nurse Practitioners) who all work together to provide you with the care you need, when you need it.  Your next appointment:   1 year  Provider:   You may see Will Cortland Ding, MD or one of the following Advanced Practice Providers on your designated Care Team:   Mertha Abrahams, [place] [address]" [place], PA-C Suzann Riddle, NP Creighton Doffing, NP

## 2023-09-15 NOTE — Progress Notes (Signed)
  Electrophysiology Office Note:   Date:  09/15/2023  ID:  Gary Brady, DOB 05/02/1951, MRN [id number]  Primary Cardiologist: Alexandria Angel, MD Primary Heart Failure: None Electrophysiologist: Rasa Degrazia Cortland Ding, MD      History of Present Illness:   Gary Brady is a 73 y.o. male with h/o coronary artery disease, chronic systolic heart failure due to nonischemic cardiomyopathy, hypertension, hyperlipidemia, diabetes seen today for routine electrophysiology followup.   Since last being seen in our clinic the patient reports doing overall well.  He has no acute complaints.  He did have 1 episode where he had swelling in his lower extremities, but this was short-lived.  He was around the time of the golf trip where he thinks that he ate and drank too much.  Aside from that he has no acute complaints.  he denies chest pain, palpitations, dyspnea, PND, orthopnea, nausea, vomiting, dizziness, syncope, edema, weight gain, or early satiety.   Review of systems complete and found to be negative unless listed in HPI.      EP Information / Studies Reviewed:    EKG is ordered today. Personal review as below.  EKG Interpretation Date/Time:  Monday Sep 15 2023 13:41:44 EDT Ventricular Rate:  58 PR Interval:  210 QRS Duration:  120 QT Interval:  456 QTC Calculation: 447 R Axis:   -50  Text Interpretation: Sinus bradycardia with sinus arrhythmia with 1st degree A-V block Left axis deviation Incomplete left bundle branch block When compared with ECG of 07-Apr-2013 03:45, No significant change since last tracing Confirmed by Jakiya Bookbinder ([id number]) on 09/15/2023 1:59:15 PM   ICD Interrogation-  reviewed in detail today,  See PACEART report.  Device History: Medtronic Single Chamber ICD implanted 2014 for chronic systolic heart failure History of appropriate therapy: No History of AAD therapy: No   Risk Assessment/Calculations:             Physical Exam:   VS:  BP 134/80 (BP Location:  Right Arm, Patient Position: Sitting, Cuff Size: Large)   Pulse (!) 58   Ht 5\' 8"  (1.727 m)   Wt 202 lb (91.6 kg)   SpO2 99%   BMI 30.71 kg/m    Wt Readings from Last 3 Encounters:  09/15/23 202 lb (91.6 kg)  01/15/23 226 lb 9.6 oz (102.8 kg)  07/15/22 230 lb 14.4 oz (104.7 kg)   GEN: Well nourished, well developed in no acute distress NECK: No JVD; No carotid bruits CARDIAC: Regular rate and rhythm, no murmurs, rubs, gallops RESPIRATORY:  Clear to auscultation without rales, wheezing or rhonchi  ABDOMEN: Soft, non-tender, non-distended EXTREMITIES:  No edema; No deformity   ASSESSMENT AND PLAN:    Chronic systolic dysfunction s/p Medtronic single chamber ICD  euvolemic today Stable on an appropriate medical regimen Normal ICD function Sensing, threshold, impedance within normal limits Programming reviewed and appropriate See Pace Art report No changes today  2.  Coronary artery disease: No current angina.  Plan per primary cardiology  3.  Hypertension:well controlled  Disposition:   Follow up with EP APP in 12 months   Signed, Dartanyan Deasis Cortland Ding, MD

## 2023-09-21 LAB — LAB REPORT - SCANNED
A1c: 9.8
EGFR: 27

## 2023-09-22 ENCOUNTER — Ambulatory Visit: Attending: Cardiology

## 2023-09-22 DIAGNOSIS — Z95810 Presence of automatic (implantable) cardiac defibrillator: Secondary | ICD-10-CM | POA: Diagnosis not present

## 2023-09-22 DIAGNOSIS — I5022 Chronic systolic (congestive) heart failure: Secondary | ICD-10-CM | POA: Diagnosis not present

## 2023-09-24 ENCOUNTER — Encounter (HOSPITAL_COMMUNITY): Payer: Self-pay | Admitting: Urology

## 2023-09-24 ENCOUNTER — Telehealth: Payer: Self-pay

## 2023-09-24 ENCOUNTER — Other Ambulatory Visit (HOSPITAL_COMMUNITY): Payer: Self-pay | Admitting: Urology

## 2023-09-24 DIAGNOSIS — D4101 Neoplasm of uncertain behavior of right kidney: Secondary | ICD-10-CM

## 2023-09-24 NOTE — Progress Notes (Signed)
 EPIC Encounter for ICM Monitoring  Patient Name: Gary Brady is a 73 y.o. male Date: 09/24/2023 Primary Care Physican: Candiss Chamorro, MD Primary Cardiologist: Audery Blazing Electrophysiologist: Lawana Pray 07/15/2022 Weight: 222 lbs 08/14/2022 Weight: 231 lbs 09/20/2022 Weight: 225 lbs 05/01/2024 Weight: 209 lbs 07/08/2023 Weight: 198 lbs 07/15/2023 Weight: 198 lbs   Battery Longevity: 11 months   Attempted call to patient and unable to reach.   Transmission results reviewed.    Pt eats foods higher in salt at times and limited cooking at home.    Optivol thoracic impedance suggesting possible fluid accumulation starting 5/15 and returning to baseline.   Optivol calibrated on 09/15/23.    Prescribed:  Furosemide  40 mg take take 2 tablet(s) (80 mg total) by mouth daily.  08/20/2022 Per Dr Lourdes Roy lab note pt to take Lasix  40 mg daily and can additional 40 mg for 2-3 lb weight gain.       Klor Con 20 mEq 1 tablet by mouth as directed only take on days that Lasix  is taken Spironolactone  20 mg take 0.5 tablet twice a day   Labs: 09/12/2022 Creatinine 1.83, BUN 29, Potassium 4.7, Sodium 136, GFR 39  08/19/2022 Creatinine 2.22, BUN 46, Potassium 5.0, Sodium 136, GFR 31  07/15/2022 Creatinine 1.57, BUN 28, Potassium 4.8, Sodium 137, GFR 47 A complete set of results can be found in Results Review.   Recommendations:  Unable to reach.     Follow-up plan: ICM clinic phone appointment on 11/24/2023.  91 day device clinic remote transmission 12/11/2023.     EP/Cardiology Office Visits:  Recall 09/09/2024 with EP APP.  11/10/2023 with Dr Audery Blazing (6 month f/u).   Copy of ICM check sent to Dr. Lawana Pray.   3 month ICM trend: 09/22/2023.    12-14 Month ICM trend:     Almyra Jain, RN 09/24/2023 8:57 AM

## 2023-09-24 NOTE — Telephone Encounter (Signed)
Remote ICM transmission received.  Attempted call to patient regarding ICM remote transmission and mail box is full. 

## 2023-09-26 ENCOUNTER — Ambulatory Visit: Payer: Self-pay

## 2023-09-26 ENCOUNTER — Telehealth: Payer: Self-pay

## 2023-09-26 NOTE — Telephone Encounter (Signed)
 Spoke with pt who denies current CP, SOB or dizziness.  Pt does report increase in ankle edema.  Pt advised will send to Dr Audery Blazing and his RN to assist with scheduling.  Pt verbalizes understanding and agrees with current plan.

## 2023-09-26 NOTE — Telephone Encounter (Signed)
 Result Note Should see gen cards app to follow up on fluid status Normand Beckwith Sharion Davidson, RN to Lei Pump, MD   09/26/23  9:15 AM Result Note Spoke with pt. Told pt to take lasix  BID (80mg  total) for the next 4 days. Did not tell pt to increase lasix . Advised pt of a low sodium diet. Will route to Dr Lawana Pray in case he did want patient on 80mg  BID (160mg  total). CUP PACEART REMOTE DEVICE CHECK Camnitz, Babetta Lesch, MD to Alvenia Aus, RN   09/15/23  1:09 PM Result Note Remote Defibrillator interrogation reviewed. Presenting Rhythm:V-sensed. Battery and lead parameters stable with stable capture and sensing. Device programming is appropriate. No arrhythmias noted. Continue remote monitoring. Lasix  BID x4 days, low salt diet CUP PACEART REMOTE DEVICE CHECK

## 2023-09-30 NOTE — Telephone Encounter (Signed)
 Spoke with Gary Brady, he reports no SOB or edema at present. He does report he missed the second dose of furosemide  acouple days last week. He is down 3 pounds this morning. He has a follow up appointment with dr Audery Blazing in July and dose not feel he needs to be seen right now. He would like to wait and he voiced understanding to call if weight goes up, if he has edema or SOB.

## 2023-10-20 NOTE — Progress Notes (Signed)
 Remote ICD transmission.

## 2023-10-26 ENCOUNTER — Other Ambulatory Visit: Payer: Self-pay | Admitting: Cardiology
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
## 2023-11-10 ENCOUNTER — Encounter: Payer: Self-pay | Admitting: Cardiology

## 2023-11-10 ENCOUNTER — Ambulatory Visit: Attending: Cardiology | Admitting: Cardiology

## 2023-11-10 VITALS — BP 90/50 | HR 70 | Ht 67.0 in | Wt 191.0 lb

## 2023-11-10 DIAGNOSIS — Z95810 Presence of automatic (implantable) cardiac defibrillator: Secondary | ICD-10-CM | POA: Diagnosis present

## 2023-11-10 DIAGNOSIS — E7800 Pure hypercholesterolemia, unspecified: Secondary | ICD-10-CM | POA: Insufficient documentation

## 2023-11-10 DIAGNOSIS — I5022 Chronic systolic (congestive) heart failure: Secondary | ICD-10-CM | POA: Diagnosis not present

## 2023-11-10 DIAGNOSIS — I2510 Atherosclerotic heart disease of native coronary artery without angina pectoris: Secondary | ICD-10-CM | POA: Insufficient documentation

## 2023-11-10 DIAGNOSIS — I10 Essential (primary) hypertension: Secondary | ICD-10-CM | POA: Diagnosis present

## 2023-11-10 LAB — BASIC METABOLIC PANEL WITH GFR
BUN/Creatinine Ratio: 11 (ref 10–24)
BUN: 32 mg/dL — ABNORMAL HIGH (ref 8–27)
CO2: 19 mmol/L — ABNORMAL LOW (ref 20–29)
Calcium: 8.9 mg/dL (ref 8.6–10.2)
Chloride: 100 mmol/L (ref 96–106)
Creatinine, Ser: 2.8 mg/dL — ABNORMAL HIGH (ref 0.76–1.27)
Glucose: 226 mg/dL — ABNORMAL HIGH (ref 70–99)
Potassium: 4.5 mmol/L (ref 3.5–5.2)
Sodium: 136 mmol/L (ref 134–144)
eGFR: 23 mL/min/1.73 — ABNORMAL LOW (ref >59)

## 2023-11-10 MED ORDER — CARVEDILOL 6.25 MG PO TABS
6.2500 mg | ORAL_TABLET | Freq: Two times a day (BID) | ORAL | 3 refills | Status: AC
Start: 2023-11-10 — End: ?

## 2023-11-10 NOTE — Patient Instructions (Signed)
 Medication Instructions:   DECREASE CARVEDILOL  TO 6.25 MG TWICE DAILY= 1/2 OF THE 12.5 MG TABLETS TWICE DAILY  *If you need a refill on your cardiac medications before your next appointment, please call your pharmacy*   Follow-Up: At [facility], you and your health needs are our priority.  As part of our continuing mission to provide you with exceptional heart care, our providers are all part of one team.  This team includes your primary Cardiologist (physician) and Advanced Practice Providers or APPs (Physician Assistants and Nurse Practitioners) who all work together to provide you with the care you need, when you need it.  Your next appointment:   6 month(s)  Provider:   Redell Shallow, MD

## 2023-11-11 ENCOUNTER — Ambulatory Visit: Payer: Self-pay | Admitting: Cardiology

## 2023-11-11 DIAGNOSIS — I5022 Chronic systolic (congestive) heart failure: Secondary | ICD-10-CM

## 2023-11-11 MED ORDER — FUROSEMIDE 40 MG PO TABS: ORAL_TABLET | ORAL | Status: DC

## 2023-11-17 ENCOUNTER — Other Ambulatory Visit: Payer: Self-pay | Admitting: Cardiology

## 2023-11-24 ENCOUNTER — Ambulatory Visit: Attending: Cardiology

## 2023-11-24 DIAGNOSIS — Z95810 Presence of automatic (implantable) cardiac defibrillator: Secondary | ICD-10-CM | POA: Diagnosis not present

## 2023-11-24 DIAGNOSIS — I5022 Chronic systolic (congestive) heart failure: Secondary | ICD-10-CM

## 2023-11-25 NOTE — Progress Notes (Unsigned)
 EPIC Encounter for ICM Monitoring  Patient Name: Gary Brady is a 73 y.o. male Date: 11/25/2023 Primary Care Physican: Loring Tanda Mae, MD Primary Cardiologist: Pietro Electrophysiologist: Inocencio 07/15/2022 Weight: 222 lbs 08/14/2022 Weight: 231 lbs 09/20/2022 Weight: 225 lbs 05/01/2024 Weight: 209 lbs 07/08/2023 Weight: 198 lbs 07/15/2023 Weight: 198 lbs 11/10/2023 Office Weight: 191 lbs   Battery Longevity: 9 months   Spoke with patient and heart failure questions reviewed.  Transmission results reviewed.  Pt asymptomatic for fluid accumulation.     He has not followed Dr Janise recommendations from 7/7 (decreased Lasix  dosage, called Nephrologist or repeated labs). Currently taking Lasix  40 mg twice a day.  He is feeling okay and no complaints today.     Pt eats foods higher in salt at times and limited cooking at home.    Optivol thoracic impedance suggesting possible fluid accumulation starting 6/4.   Pt having difficulty maintaining normal fluid levels for the last 6 weeks.    Optivol calibrated on 09/15/23.    Prescribed:  Furosemide  40 mg take take 2 tablet(s) (40 mg total) by mouth every other day alternating with 1 tablet (20 mg total) every other day daily.  11/11/2023 Per Dr Pietro decreased Lasix  but patient is still taking Lasix  40 mg bid.       Klor Con 20 mEq 1 tablet by mouth as directed only take on days that Lasix  is taken Spironolactone  20 mg take 0.5 tablet twice a day   Labs: 11/25/2023 BMET ordered by has not had drawn yet.  11/10/2023 Creatinine 2.80, BUN 32, Potassium 4.5, Sodium 136, GFR 23 09/12/2022 Creatinine 1.83, BUN 29, Potassium 4.7, Sodium 136, GFR 39  08/19/2022 Creatinine 2.22, BUN 46, Potassium 5.0, Sodium 136, GFR 31  07/15/2022 Creatinine 1.57, BUN 28, Potassium 4.8, Sodium 137, GFR 47 A complete set of results can be found in Results Review.   Recommendations:   Advised patient to follow Dr Janise orders to 1. decrease Lasix  to 40  mg alternating with 20 mg daily    2.   Repeat BMET lab  and 3. Call Nephrologist for appointment.    Copy sent to Dr Pietro and [facility] labs obtained this week will be based on patient taking Lasix  40 mg bid instead of the changed dosage.     Follow-up plan: ICM clinic phone appointment on 12/02/2023 to recheck fluid levels.  91 day device clinic remote transmission 12/11/2023.     EP/Cardiology Office Visits:  Recall 09/09/2024 with EP APP.  11/10/2023 with Dr Pietro (6 month f/u).   Copy of ICM check sent to Dr. Inocencio.   3 month ICM trend: 11/24/2023.    12-14 Month ICM trend:   ***  Mitzie GORMAN Garner, RN 11/25/2023 5:09 PM

## 2023-11-27 LAB — BASIC METABOLIC PANEL WITH GFR
BUN/Creatinine Ratio: 10 (ref 10–24)
BUN: 27 mg/dL (ref 8–27)
CO2: 23 mmol/L (ref 20–29)
Calcium: 9.3 mg/dL (ref 8.6–10.2)
Chloride: 97 mmol/L (ref 96–106)
Creatinine, Ser: 2.64 mg/dL — ABNORMAL HIGH (ref 0.76–1.27)
Glucose: 364 mg/dL — ABNORMAL HIGH (ref 70–99)
Potassium: 4.4 mmol/L (ref 3.5–5.2)
Sodium: 138 mmol/L (ref 134–144)
eGFR: 25 mL/min/1.73 — ABNORMAL LOW (ref >59)

## 2023-12-02 ENCOUNTER — Ambulatory Visit: Attending: Cardiology

## 2023-12-02 DIAGNOSIS — Z95810 Presence of automatic (implantable) cardiac defibrillator: Secondary | ICD-10-CM

## 2023-12-02 DIAGNOSIS — I5022 Chronic systolic (congestive) heart failure: Secondary | ICD-10-CM

## 2023-12-03 ENCOUNTER — Telehealth: Payer: Self-pay

## 2023-12-03 NOTE — Progress Notes (Signed)
 EPIC Encounter for ICM Monitoring  Patient Name: Gary Brady is a 73 y.o. male Date: 12/03/2023 Primary Care Physican: Loring Tanda Mae, MD Primary Cardiologist: Pietro Electrophysiologist: [facility] Nephrologist 07/15/2022 Weight: 222 lbs 08/14/2022 Weight: 231 lbs 09/20/2022 Weight: 225 lbs 05/01/2024 Weight: 209 lbs 07/08/2023 Weight: 198 lbs 07/15/2023 Weight: 198 lbs 11/10/2023 Office Weight: 191 lbs   Battery Longevity: 9 months   Attempted call to patient and unable to reach.   Transmission results reviewed.    Pt eats foods higher in salt at times and limited cooking at home.    Optivol thoracic impedance suggesting possible fluid accumulation starting 6/4 but trending back toward baseline 7/27.   Pt having difficulty maintaining normal fluid levels for the last 6 weeks.    Optivol calibrated on 09/15/23.    Prescribed:  Furosemide  40 mg take take 2 tablet(s) (40 mg total) by mouth every other day alternating with 1 tablet (20 mg total) every other day daily.  11/11/2023 Per Dr Pietro decreased Lasix  but patient is still taking Lasix  40 mg bid.       Klor Con 20 mEq 1 tablet by mouth as directed only take on days that Lasix  is taken Spironolactone  20 mg take 0.5 tablet twice a day   Labs: 11/26/2023 Creatinine 2.64, BUN 27, Potassium 4.4, Sodium 138, GFR 25, Glucose 364 11/10/2023 Creatinine 2.80, BUN 32, Potassium 4.5, Sodium 136, GFR 23 09/12/2022 Creatinine 1.83, BUN 29, Potassium 4.7, Sodium 136, GFR 39  08/19/2022 Creatinine 2.22, BUN 46, Potassium 5.0, Sodium 136, GFR 31  07/15/2022 Creatinine 1.57, BUN 28, Potassium 4.8, Sodium 137, GFR 47 A complete set of results can be found in Results Review.   Recommendations:  Unable to reach.      Follow-up plan: ICM clinic phone appointment on 12/29/2023.  91 day device clinic remote transmission 12/11/2023.     EP/Cardiology Office Visits:  Recall 09/09/2024 with EP APP.  Recall 05/08/2024 with Dr Pietro.  Unsure of next  Nephrologist appointment.  Pt missed MRI of kidneys per 7/24 phone note.     Copy of ICM check sent to Dr. Inocencio.   3 month ICM trend: 12/02/2023.    12-14 Month ICM trend:     Mitzie GORMAN Garner, RN 12/03/2023 1:55 PM

## 2023-12-03 NOTE — Telephone Encounter (Signed)
 Remote ICM transmission received.  Attempted call to patient regarding ICM remote transmission and no answer.

## 2023-12-11 ENCOUNTER — Ambulatory Visit: Payer: Self-pay | Admitting: Cardiology

## 2023-12-11 ENCOUNTER — Ambulatory Visit: Payer: Medicare Other

## 2023-12-11 DIAGNOSIS — I428 Other cardiomyopathies: Secondary | ICD-10-CM

## 2023-12-11 LAB — CUP PACEART REMOTE DEVICE CHECK
Battery Remaining Longevity: 8 mo
Battery Voltage: 2.88 V
Brady Statistic RV Percent Paced: 0.01 %
Date Time Interrogation Session: 20250807001707
HighPow Impedance: 87 Ohm
Implantable Lead Connection Status: 753985
Implantable Lead Implant Date: 20141202
Implantable Lead Location: 753860
Implantable Lead Model: 6935
Implantable Pulse Generator Implant Date: 20141202
Lead Channel Impedance Value: 494 Ohm
Lead Channel Impedance Value: 532 Ohm
Lead Channel Pacing Threshold Amplitude: 0.5 V
Lead Channel Pacing Threshold Pulse Width: 0.4 ms
Lead Channel Sensing Intrinsic Amplitude: 6.25 mV
Lead Channel Sensing Intrinsic Amplitude: 6.25 mV
Lead Channel Setting Pacing Amplitude: 2 V
Lead Channel Setting Pacing Pulse Width: 0.4 ms
Lead Channel Setting Sensing Sensitivity: 0.3 mV
Zone Setting Status: 755011

## 2023-12-29 ENCOUNTER — Ambulatory Visit: Attending: Cardiology

## 2023-12-29 DIAGNOSIS — I5022 Chronic systolic (congestive) heart failure: Secondary | ICD-10-CM

## 2023-12-29 DIAGNOSIS — Z95810 Presence of automatic (implantable) cardiac defibrillator: Secondary | ICD-10-CM

## 2024-01-01 NOTE — Progress Notes (Signed)
 EPIC Encounter for ICM Monitoring  Patient Name: Gary Brady is a 73 y.o. male Date: 01/01/2024 Primary Care Physican: Loring Tanda Mae, MD Primary Cardiologist: Pietro Electrophysiologist: [facility] Nephrologist 07/15/2022 Weight: 222 lbs 08/14/2022 Weight: 231 lbs 09/20/2022 Weight: 225 lbs 05/01/2024 Weight: 209 lbs 07/08/2023 Weight: 198 lbs 07/15/2023 Weight: 198 lbs 11/10/2023 Office Weight: 191 lbs   Battery Longevity: 8 months   Transmission results reviewed.    Pt eats foods higher in salt at times and limited cooking at home.    Optivol thoracic impedance suggesting intermittent days with possible  fluid accumulation starting 7/27.     Optivol calibrated on 09/15/23.    Prescribed:  Furosemide  40 mg take take 2 tablet(s) (40 mg total) by mouth every other day alternating with 1 tablet (20 mg total) every other day daily.  11/11/2023 Per Dr Pietro decreased Lasix  but patient is still taking Lasix  40 mg bid.       Klor Con 20 mEq 1 tablet by mouth as directed only take on days that Lasix  is taken Spironolactone  20 mg take 0.5 tablet twice a day   Labs: 11/26/2023 Creatinine 2.64, BUN 27, Potassium 4.4, Sodium 138, GFR 25, Glucose 364 11/10/2023 Creatinine 2.80, BUN 32, Potassium 4.5, Sodium 136, GFR 23 09/12/2022 Creatinine 1.83, BUN 29, Potassium 4.7, Sodium 136, GFR 39  08/19/2022 Creatinine 2.22, BUN 46, Potassium 5.0, Sodium 136, GFR 31  07/15/2022 Creatinine 1.57, BUN 28, Potassium 4.8, Sodium 137, GFR 47 A complete set of results can be found in Results Review.   Recommendations:   No changes.     Follow-up plan: ICM clinic phone appointment on 02/02/2024.  91 day device clinic remote transmission 03/11/2024.     EP/Cardiology Office Visits:  Recall 09/09/2024 with EP APP.  Recall 05/08/2024 with Dr Pietro.   Copy of ICM check sent to Dr. Inocencio.   3 month ICM trend: 12/29/2023.    12-14 Month ICM trend:     Mitzie GORMAN Garner, RN 01/01/2024 4:55 PM

## 2024-01-02 ENCOUNTER — Other Ambulatory Visit: Payer: Self-pay | Admitting: Cardiology

## 2024-01-02 DIAGNOSIS — I5022 Chronic systolic (congestive) heart failure: Secondary | ICD-10-CM

## 2024-01-31 NOTE — Progress Notes (Signed)
 Remote ICD Transmission

## 2024-02-02 ENCOUNTER — Ambulatory Visit: Attending: Cardiology

## 2024-02-02 DIAGNOSIS — I5022 Chronic systolic (congestive) heart failure: Secondary | ICD-10-CM

## 2024-02-02 DIAGNOSIS — Z95810 Presence of automatic (implantable) cardiac defibrillator: Secondary | ICD-10-CM

## 2024-02-03 NOTE — Progress Notes (Unsigned)
 EPIC Encounter for ICM Monitoring  Patient Name: Gary Brady is a 73 y.o. male Date: 02/03/2024 Primary Care Physican: Loring Tanda Mae, MD Primary Cardiologist: Pietro Electrophysiologist: [facility] Nephrologist 07/15/2022 Weight: 222 lbs 08/14/2022 Weight: 231 lbs 09/20/2022 Weight: 225 lbs 05/01/2024 Weight: 209 lbs 07/08/2023 Weight: 198 lbs 07/15/2023 Weight: 198 lbs 11/10/2023 Office Weight: 191 lbs 02/04/2024 Weight: 183 lbs   Battery Longevity: 8 months   Spoke with patient and heart failure questions reviewed.  Transmission results reviewed.  Pt asymptomatic for fluid accumulation.  Reports feeling well at this time and voices no complaints.  His mother passed away in the past week.   He has missed several days due to traveling back and forth for past 2 week while she was sick.    Pt eats foods higher in salt at times and limited cooking at home.    Since 12/29/2023 ICM Remote Transmission: Optivol thoracic impedance suggesting possible fluid accumulation starting 01/19/2024 but trending back toward baseline.     Optivol calibrated on 09/15/23.    Prescribed:  Furosemide  40 mg take take 2 tablet(s) (40 mg total) by mouth once daily.       Klor Con 20 mEq 1 tablet by mouth as directed only take on days that Lasix  is taken  Labs: 11/26/2023 Creatinine 2.64, BUN 27, Potassium 4.4, Sodium 138, GFR 25, Glucose 364 11/10/2023 Creatinine 2.80, BUN 32, Potassium 4.5, Sodium 136, GFR 23 09/12/2022 Creatinine 1.83, BUN 29, Potassium 4.7, Sodium 136, GFR 39  08/19/2022 Creatinine 2.22, BUN 46, Potassium 5.0, Sodium 136, GFR 31  07/15/2022 Creatinine 1.57, BUN 28, Potassium 4.8, Sodium 137, GFR 47 A complete set of results can be found in Results Review.   Recommendations:  He is getting back into his routine and taking his Lasix  as prescribed.  No changes and encouraged to call if experiencing any fluid symptoms.   Follow-up plan: ICM clinic phone appointment on 02/23/2024 to check  stability of fluid levels.  91 day device clinic remote transmission 03/11/2024.     EP/Cardiology Office Visits:  Recall 09/09/2024 with EP APP.  Recall 05/08/2024 with Dr Pietro.   Copy of ICM check sent to Dr. Inocencio.   Remote monitoring is medically necessary for Heart Failure Management.    90 day Daily Thoracic Impedance ICM trend: 11/03/2023 through 02/02/2024.    12-14 Month Thoracic Impedance ICM trend:     Mitzie GORMAN Garner, RN 02/03/2024 7:44 AM

## 2024-02-23 ENCOUNTER — Ambulatory Visit: Attending: Cardiology

## 2024-02-23 DIAGNOSIS — I5022 Chronic systolic (congestive) heart failure: Secondary | ICD-10-CM

## 2024-02-23 DIAGNOSIS — Z95810 Presence of automatic (implantable) cardiac defibrillator: Secondary | ICD-10-CM

## 2024-02-24 NOTE — Progress Notes (Signed)
 EPIC Encounter for ICM Monitoring  Patient Name: Gary Brady is a 73 y.o. male Date: 02/24/2024 Primary Care Physican: Loring Tanda Mae, MD Primary Cardiologist: Pietro Electrophysiologist: [facility] Nephrologist 07/15/2022 Weight: 222 lbs 08/14/2022 Weight: 231 lbs 09/20/2022 Weight: 225 lbs 05/01/2024 Weight: 209 lbs 07/08/2023 Weight: 198 lbs 07/15/2023 Weight: 198 lbs 11/10/2023 Office Weight: 191 lbs 02/04/2024 Weight: 183 lbs   Battery Longevity:  7 months   Spoke with patient and heart failure questions reviewed.  Transmission results reviewed.  Pt asymptomatic for fluid accumulation.  Reports feeling well at this time and voices no complaints.     Pt eats foods higher in salt at times and limited cooking at home.    Since 02/02/2024 ICM Remote Transmission: Optivol thoracic impedance suggesting fluid levels returned to normal.     Optivol calibrated on 09/15/23.    Prescribed:  Furosemide  40 mg take take 2 tablet(s) (40 mg total) by mouth once daily.       Klor Con 20 mEq 1 tablet by mouth as directed only take on days that Lasix  is taken   Labs: 11/26/2023 Creatinine 2.64, BUN 27, Potassium 4.4, Sodium 138, GFR 25, Glucose 364 11/10/2023 Creatinine 2.80, BUN 32, Potassium 4.5, Sodium 136, GFR 23 09/12/2022 Creatinine 1.83, BUN 29, Potassium 4.7, Sodium 136, GFR 39  08/19/2022 Creatinine 2.22, BUN 46, Potassium 5.0, Sodium 136, GFR 31  07/15/2022 Creatinine 1.57, BUN 28, Potassium 4.8, Sodium 137, GFR 47 A complete set of results can be found in Results Review.   Recommendations:  No changes and encouraged to call if experiencing any fluid symptoms.   Follow-up plan: ICM clinic phone appointment on 03/15/2024.  91 day device clinic remote transmission 03/11/2024.     EP/Cardiology Office Visits:  Recall 09/09/2024 with EP APP.  Recall 05/08/2024 with Dr Pietro.   Copy of ICM check sent to Dr. Inocencio.    Remote monitoring is medically necessary for Heart Failure  Management.    Daily Thoracic Impedance ICM trend: 11/24/2023 through 02/23/2024.    12-14 Month Thoracic Impedance ICM trend:     Mitzie GORMAN Garner, RN 02/24/2024 1:57 PM

## 2024-03-11 ENCOUNTER — Ambulatory Visit

## 2024-03-11 DIAGNOSIS — I5022 Chronic systolic (congestive) heart failure: Secondary | ICD-10-CM | POA: Diagnosis not present

## 2024-03-14 LAB — CUP PACEART REMOTE DEVICE CHECK
Battery Remaining Longevity: 6 mo
Battery Voltage: 2.88 V
Brady Statistic RV Percent Paced: 0.01 %
Date Time Interrogation Session: 20251106043726
HighPow Impedance: 69 Ohm
Implantable Lead Connection Status: 753985
Implantable Lead Implant Date: 20141202
Implantable Lead Location: 753860
Implantable Lead Model: 6935
Implantable Pulse Generator Implant Date: 20141202
Lead Channel Impedance Value: 437 Ohm
Lead Channel Impedance Value: 513 Ohm
Lead Channel Pacing Threshold Amplitude: 0.5 V
Lead Channel Pacing Threshold Pulse Width: 0.4 ms
Lead Channel Sensing Intrinsic Amplitude: 7.375 mV
Lead Channel Sensing Intrinsic Amplitude: 7.375 mV
Lead Channel Setting Pacing Amplitude: 2 V
Lead Channel Setting Pacing Pulse Width: 0.4 ms
Lead Channel Setting Sensing Sensitivity: 0.3 mV
Zone Setting Status: 755011

## 2024-03-15 ENCOUNTER — Ambulatory Visit: Attending: Cardiology

## 2024-03-15 DIAGNOSIS — I5022 Chronic systolic (congestive) heart failure: Secondary | ICD-10-CM | POA: Diagnosis not present

## 2024-03-15 DIAGNOSIS — Z95810 Presence of automatic (implantable) cardiac defibrillator: Secondary | ICD-10-CM | POA: Diagnosis not present

## 2024-03-16 ENCOUNTER — Ambulatory Visit: Payer: Self-pay | Admitting: Cardiology

## 2024-03-16 NOTE — Progress Notes (Signed)
 Remote ICD Transmission

## 2024-03-19 LAB — LAB REPORT - SCANNED
A1c: 8.2
Creatinine, POC: 83.8 mg/dL
EGFR: 36

## 2024-03-19 NOTE — Progress Notes (Signed)
 EPIC Encounter for ICM Monitoring  Patient Name: Gary Brady is a 73 y.o. male Date: 03/19/2024 Primary Care Physican: Loring Tanda Mae, MD Primary Cardiologist: Pietro Electrophysiologist: [facility] Nephrologist 07/15/2022 Weight: 222 lbs 08/14/2022 Weight: 231 lbs 09/20/2022 Weight: 225 lbs 05/01/2024 Weight: 209 lbs 07/08/2023 Weight: 198 lbs 07/15/2023 Weight: 198 lbs 11/10/2023 Office Weight: 191 lbs 02/04/2024 Weight: 183 lbs   Battery Longevity: 6 months   Transmission results reviewed.     Pt eats foods higher in salt at times and limited cooking at home.    Since 02/23/2024 ICM Remote Transmission: Optivol thoracic impedance suggesting normal fluid levels with the exception of possible fluid accumulation from 02/24/2024-03/05/2024.     Optivol calibrated on 09/15/23.    Prescribed:  Furosemide  40 mg take take 2 tablet(s) (40 mg total) by mouth once daily.       Klor Con 20 mEq 1 tablet by mouth as directed only take on days that Lasix  is taken   Labs: 11/26/2023 Creatinine 2.64, BUN 27, Potassium 4.4, Sodium 138, GFR 25, Glucose 364 11/10/2023 Creatinine 2.80, BUN 32, Potassium 4.5, Sodium 136, GFR 23 09/12/2022 Creatinine 1.83, BUN 29, Potassium 4.7, Sodium 136, GFR 39  08/19/2022 Creatinine 2.22, BUN 46, Potassium 5.0, Sodium 136, GFR 31  07/15/2022 Creatinine 1.57, BUN 28, Potassium 4.8, Sodium 137, GFR 47 A complete set of results can be found in Results Review.   Recommendations:  No changes.   Follow-up plan: ICM clinic phone appointment on 04/19/2024.  91 day device clinic remote transmission 06/12/2024.     EP/Cardiology Office Visits:  Recall 09/09/2024 with EP APP.  Recall 05/08/2024 with Dr Pietro.   Copy of ICM check sent to Dr. Inocencio.    Remote monitoring is medically necessary for Heart Failure Management.    Daily Thoracic Impedance ICM trend: 12/11/2023 through 03/11/2024.    12-14 Month Thoracic Impedance ICM trend:     Mitzie GORMAN Garner,  RN 03/19/2024 11:16 AM

## 2024-04-11 ENCOUNTER — Ambulatory Visit: Attending: Cardiology

## 2024-04-12 LAB — CUP PACEART REMOTE DEVICE CHECK
Battery Remaining Longevity: 6 mo
Battery Voltage: 2.87 V
Brady Statistic RV Percent Paced: 0.01 %
Date Time Interrogation Session: 20251207042704
HighPow Impedance: 71 Ohm
Implantable Lead Connection Status: 753985
Implantable Lead Implant Date: 20141202
Implantable Lead Location: 753860
Implantable Lead Model: 6935
Implantable Pulse Generator Implant Date: 20141202
Lead Channel Impedance Value: 456 Ohm
Lead Channel Impedance Value: 513 Ohm
Lead Channel Pacing Threshold Amplitude: 0.375 V
Lead Channel Pacing Threshold Pulse Width: 0.4 ms
Lead Channel Sensing Intrinsic Amplitude: 6.375 mV
Lead Channel Sensing Intrinsic Amplitude: 6.375 mV
Lead Channel Setting Pacing Amplitude: 2 V
Lead Channel Setting Pacing Pulse Width: 0.4 ms
Lead Channel Setting Sensing Sensitivity: 0.3 mV
Zone Setting Status: 755011

## 2024-04-19 ENCOUNTER — Ambulatory Visit: Attending: Cardiology

## 2024-04-19 DIAGNOSIS — I5022 Chronic systolic (congestive) heart failure: Secondary | ICD-10-CM

## 2024-04-19 DIAGNOSIS — Z95810 Presence of automatic (implantable) cardiac defibrillator: Secondary | ICD-10-CM

## 2024-04-23 NOTE — Progress Notes (Cosign Needed)
 EPIC Encounter for ICM Monitoring  Patient Name: Gary Brady is a 73 y.o. male Date: 04/23/2024 Primary Care Physican: Loring Tanda Mae, MD Primary Cardiologist: Pietro Electrophysiologist: [facility] Nephrologist 07/15/2022 Weight: 222 lbs 08/14/2022 Weight: 231 lbs 09/20/2022 Weight: 225 lbs 05/01/2024 Weight: 209 lbs 07/08/2023 Weight: 198 lbs 07/15/2023 Weight: 198 lbs 11/10/2023 Office Weight: 191 lbs 02/04/2024 Weight: 183 lbs   Battery Longevity: 6 months   Transmission results reviewed.     Pt eats foods higher in salt at times and limited cooking at home.    Since 03/15/2024 ICM Remote Transmission: Optivol thoracic impedance suggesting intermittent days with possible fluid accumulation.     Optivol calibrated on 09/15/23.    Prescribed:  Furosemide  40 mg take take 2 tablet(s) (40 mg total) by mouth once daily.       Klor Con 20 mEq 1 tablet by mouth as directed only take on days that Lasix  is taken   Labs: 11/26/2023 Creatinine 2.64, BUN 27, Potassium 4.4, Sodium 138, GFR 25, Glucose 364 11/10/2023 Creatinine 2.80, BUN 32, Potassium 4.5, Sodium 136, GFR 23 09/12/2022 Creatinine 1.83, BUN 29, Potassium 4.7, Sodium 136, GFR 39  08/19/2022 Creatinine 2.22, BUN 46, Potassium 5.0, Sodium 136, GFR 31  07/15/2022 Creatinine 1.57, BUN 28, Potassium 4.8, Sodium 137, GFR 47 A complete set of results can be found in Results Review.   Recommendations:  No changes.   Follow-up plan: ICM clinic phone appointment on 05/31/2024.  91 day device clinic remote transmission 06/12/2024.     EP/Cardiology Office Visits:  Recall 09/09/2024 with EP APP.  Recall 05/08/2024 with Dr Pietro.   Copy of ICM check sent to Dr. Inocencio.    Remote monitoring is medically necessary for Heart Failure Management.    Daily Thoracic Impedance ICM trend: 01/19/2024 through 04/19/2024.    12-14 Month Thoracic Impedance ICM trend:     Mitzie GORMAN Garner, RN 04/23/2024 3:12 PM

## 2024-05-12 ENCOUNTER — Ambulatory Visit: Attending: Cardiology

## 2024-05-13 LAB — CUP PACEART REMOTE DEVICE CHECK
Battery Remaining Longevity: 4 mo
Battery Voltage: 2.85 V
Brady Statistic RV Percent Paced: 0 %
Date Time Interrogation Session: 20260107012404
HighPow Impedance: 72 Ohm
Implantable Lead Connection Status: 753985
Implantable Lead Implant Date: 20141202
Implantable Lead Location: 753860
Implantable Lead Model: 6935
Implantable Pulse Generator Implant Date: 20141202
Lead Channel Impedance Value: 437 Ohm
Lead Channel Impedance Value: 513 Ohm
Lead Channel Pacing Threshold Amplitude: 0.5 V
Lead Channel Pacing Threshold Pulse Width: 0.4 ms
Lead Channel Sensing Intrinsic Amplitude: 6 mV
Lead Channel Sensing Intrinsic Amplitude: 6 mV
Lead Channel Setting Pacing Amplitude: 2 V
Lead Channel Setting Pacing Pulse Width: 0.4 ms
Lead Channel Setting Sensing Sensitivity: 0.3 mV
Zone Setting Status: 755011

## 2024-05-31 ENCOUNTER — Ambulatory Visit: Attending: Cardiology

## 2024-05-31 DIAGNOSIS — Z95810 Presence of automatic (implantable) cardiac defibrillator: Secondary | ICD-10-CM | POA: Diagnosis not present

## 2024-05-31 DIAGNOSIS — I5022 Chronic systolic (congestive) heart failure: Secondary | ICD-10-CM | POA: Diagnosis not present

## 2024-06-02 NOTE — Progress Notes (Signed)
 EPIC Encounter for ICM Monitoring  Patient Name: Gary Brady is a 74 y.o. male Date: 06/02/2024 Primary Care Physican: Loring Tanda Mae, MD Primary Cardiologist: Pietro Electrophysiologist: [facility] Nephrologist 07/15/2022 Weight: 222 lbs 08/14/2022 Weight: 231 lbs 09/20/2022 Weight: 225 lbs 05/01/2024 Weight: 209 lbs 07/08/2023 Weight: 198 lbs 07/15/2023 Weight: 198 lbs 11/10/2023 Office Weight: 191 lbs 02/04/2024 Weight: 183 lbs   Battery Longevity: 1 month   Transmission results reviewed.     Pt eats foods higher in salt at times and limited cooking at home.    Since 04/19/2024 ICM Remote Transmission: Optivol thoracic impedance suggesting intermittent days with possible fluid accumulation.     Optivol calibrated on 09/15/23.    Prescribed:  Furosemide  40 mg take take 2 tablet(s) (40 mg total) by mouth once daily.       Klor Con 20 mEq 1 tablet by mouth as directed only take on days that Lasix  is taken   Labs: 11/26/2023 Creatinine 2.64, BUN 27, Potassium 4.4, Sodium 138, GFR 25, Glucose 364 11/10/2023 Creatinine 2.80, BUN 32, Potassium 4.5, Sodium 136, GFR 23 09/12/2022 Creatinine 1.83, BUN 29, Potassium 4.7, Sodium 136, GFR 39  08/19/2022 Creatinine 2.22, BUN 46, Potassium 5.0, Sodium 136, GFR 31  07/15/2022 Creatinine 1.57, BUN 28, Potassium 4.8, Sodium 137, GFR 47 A complete set of results can be found in Results Review.   Recommendations:  No changes.   Follow-up plan: ICM clinic 31 day follow up currently on hold but 91 day remote monitoring will continue.   91 day device clinic remote transmission 06/12/2024.     EP/Cardiology Office Visits:  Recall 09/09/2024 with EP APP.  Recall 05/08/2024 with Dr Pietro.   Copy of ICM check sent to Dr. Inocencio.    Remote monitoring is medically necessary for Heart Failure Management.    Daily Thoracic Impedance ICM trend: 03/01/2024 through 05/31/2024.    12-14 Month Thoracic Impedance ICM trend:     Mitzie GORMAN Garner,  RN 06/02/2024 3:56 PM

## 2024-06-10 ENCOUNTER — Encounter

## 2024-06-12 ENCOUNTER — Ambulatory Visit

## 2024-07-13 ENCOUNTER — Ambulatory Visit

## 2024-08-13 ENCOUNTER — Ambulatory Visit

## 2024-09-09 ENCOUNTER — Encounter

## 2024-09-13 ENCOUNTER — Ambulatory Visit

## 2024-12-09 ENCOUNTER — Encounter

## 2025-03-10 ENCOUNTER — Encounter
